# Patient Record
Sex: Female | Born: 1935 | Race: White | Hispanic: No | Smoking: Never smoker
Health system: Southern US, Community
[De-identification: ages and names within clinical notes are randomized; demographics above are authoritative.]

## PROBLEM LIST (undated history)

## (undated) DIAGNOSIS — C801 Malignant (primary) neoplasm, unspecified: Secondary | ICD-10-CM

## (undated) DIAGNOSIS — R569 Unspecified convulsions: Secondary | ICD-10-CM

## (undated) HISTORY — DX: Unspecified convulsions: R56.9

## (undated) HISTORY — PX: OTHER SURGICAL HISTORY: SHX169

---

## 1997-12-05 ENCOUNTER — Ambulatory Visit (HOSPITAL_COMMUNITY): Admission: RE | Admit: 1997-12-05 | Discharge: 1997-12-05 | Payer: Self-pay | Admitting: Family Medicine

## 1997-12-12 ENCOUNTER — Other Ambulatory Visit: Admission: RE | Admit: 1997-12-12 | Discharge: 1997-12-12 | Payer: Self-pay | Admitting: Family Medicine

## 1999-01-02 ENCOUNTER — Ambulatory Visit (HOSPITAL_COMMUNITY): Admission: RE | Admit: 1999-01-02 | Discharge: 1999-01-02 | Payer: Self-pay | Admitting: Geriatric Medicine

## 1999-01-02 ENCOUNTER — Encounter: Payer: Self-pay | Admitting: Geriatric Medicine

## 1999-10-13 ENCOUNTER — Emergency Department (HOSPITAL_COMMUNITY): Admission: EM | Admit: 1999-10-13 | Discharge: 1999-10-13 | Payer: Self-pay | Admitting: Emergency Medicine

## 2001-04-20 ENCOUNTER — Ambulatory Visit (HOSPITAL_COMMUNITY): Admission: RE | Admit: 2001-04-20 | Discharge: 2001-04-20 | Payer: Self-pay | Admitting: Geriatric Medicine

## 2001-04-20 ENCOUNTER — Encounter: Payer: Self-pay | Admitting: Geriatric Medicine

## 2001-11-24 ENCOUNTER — Encounter: Admission: RE | Admit: 2001-11-24 | Discharge: 2001-11-24 | Payer: Self-pay | Admitting: Geriatric Medicine

## 2001-11-24 ENCOUNTER — Encounter: Payer: Self-pay | Admitting: Geriatric Medicine

## 2002-03-15 ENCOUNTER — Ambulatory Visit (HOSPITAL_COMMUNITY): Admission: RE | Admit: 2002-03-15 | Discharge: 2002-03-15 | Payer: Self-pay | Admitting: Gastroenterology

## 2002-07-26 ENCOUNTER — Other Ambulatory Visit: Admission: RE | Admit: 2002-07-26 | Discharge: 2002-07-26 | Payer: Self-pay | Admitting: Geriatric Medicine

## 2003-06-29 ENCOUNTER — Emergency Department (HOSPITAL_COMMUNITY): Admission: EM | Admit: 2003-06-29 | Discharge: 2003-06-29 | Payer: Self-pay | Admitting: *Deleted

## 2003-09-07 ENCOUNTER — Ambulatory Visit (HOSPITAL_COMMUNITY): Admission: RE | Admit: 2003-09-07 | Discharge: 2003-09-07 | Payer: Self-pay | Admitting: Geriatric Medicine

## 2004-07-15 ENCOUNTER — Encounter: Admission: RE | Admit: 2004-07-15 | Discharge: 2004-07-15 | Payer: Self-pay | Admitting: Geriatric Medicine

## 2005-02-16 ENCOUNTER — Encounter: Admission: RE | Admit: 2005-02-16 | Discharge: 2005-02-16 | Payer: Self-pay | Admitting: Geriatric Medicine

## 2005-08-19 ENCOUNTER — Other Ambulatory Visit: Admission: RE | Admit: 2005-08-19 | Discharge: 2005-08-19 | Payer: Self-pay | Admitting: Geriatric Medicine

## 2005-09-10 ENCOUNTER — Encounter: Admission: RE | Admit: 2005-09-10 | Discharge: 2005-09-10 | Payer: Self-pay | Admitting: Urology

## 2006-01-09 ENCOUNTER — Ambulatory Visit: Payer: Self-pay | Admitting: Oncology

## 2006-02-19 ENCOUNTER — Ambulatory Visit (HOSPITAL_COMMUNITY): Admission: RE | Admit: 2006-02-19 | Discharge: 2006-02-19 | Payer: Self-pay | Admitting: Urology

## 2007-07-08 ENCOUNTER — Ambulatory Visit (HOSPITAL_COMMUNITY): Admission: RE | Admit: 2007-07-08 | Discharge: 2007-07-08 | Payer: Self-pay | Admitting: Urology

## 2008-08-07 ENCOUNTER — Ambulatory Visit (HOSPITAL_COMMUNITY): Admission: RE | Admit: 2008-08-07 | Discharge: 2008-08-07 | Payer: Self-pay | Admitting: Geriatric Medicine

## 2008-08-24 ENCOUNTER — Other Ambulatory Visit: Admission: RE | Admit: 2008-08-24 | Discharge: 2008-08-24 | Payer: Self-pay | Admitting: Geriatric Medicine

## 2009-08-13 ENCOUNTER — Ambulatory Visit (HOSPITAL_COMMUNITY): Admission: RE | Admit: 2009-08-13 | Discharge: 2009-08-13 | Payer: Self-pay | Admitting: Geriatric Medicine

## 2009-09-19 ENCOUNTER — Other Ambulatory Visit: Admission: RE | Admit: 2009-09-19 | Discharge: 2009-09-19 | Payer: Self-pay | Admitting: Geriatric Medicine

## 2010-08-22 NOTE — Op Note (Signed)
   NAME:  Melanie Walker, Melanie Walker                           ACCOUNT NO.:  [id number]   MEDICAL RECORD NO.:  [id number]                   PATIENT TYPE:  AMB   LOCATION:  ENDO                                 FACILITY:  [facility]   PHYSICIAN:  Danise Edge, M.D.                DATE OF BIRTH:  [birth year]   DATE OF PROCEDURE:  03/15/2002  DATE OF DISCHARGE:                                 OPERATIVE REPORT   PROCEDURE:  Screening colonoscopy.   INDICATIONS:  The patient is a 77 year old female born 03/03/1931.  The patient  is scheduled to undergo her first screening colonoscopy with polypectomy to  prevent colon cancer.  I discussed with her the complications associated  with colonoscopy and polypectomy, including a 15 per thousand risk of  bleeding and one per thousand risk of colon perforation requiring surgical  repair.  The patient has signed the operative permit.   ENDOSCOPIST:  Danise Edge, M.D.   PREMEDICATION:  Versed 4.5 mg, Demerol 50 mg.   ENDOSCOPE:  Olympus pediatric colonoscope.   DESCRIPTION OF PROCEDURE:  After obtaining informed consent, the patient was  placed in the left lateral decubitus position.  I administered intravenous  Demerol and intravenous Versed to achieve conscious sedation for the  procedure.  The patient's blood pressure, oxygen saturation, and cardiac  rhythm were monitored throughout the procedure and documented in the medical  record.   Anal inspection was normal.  Digital rectal exam was normal.  The Olympus  pediatric video colonoscope was introduced into the rectum and advanced to  the cecum.  A normal-appearing ileocecal valve was intubated and the distal  ileum inspected.  Colonic preparation for the exam today was excellent.   Rectum normal.   Sigmoid colon and descending colon:  Left colonic diverticulosis.   Splenic flexure normal.   Transverse colon normal.   Hepatic flexure normal.   Ascending colon normal.   Cecum and ileocecal valve  normal.   Distal ileum normal.    ASSESSMENT:  Normal screening proctocolonoscopy to the cecum.  No endoscopic  evidence for the presence of colorectal neoplasia.  Left colonic  diverticulosis noted.                                               Danise Edge, M.D.    MJ/MEDQ  D:  03/15/2002  T:  03/15/2002  Job:  [id number]   cc:   Hal T. Stoneking, M.D.  [address] [place], [place] [address]  Fax: [phone]

## 2010-09-18 ENCOUNTER — Other Ambulatory Visit (HOSPITAL_COMMUNITY): Payer: Self-pay | Admitting: Geriatric Medicine

## 2010-09-18 DIAGNOSIS — Z1231 Encounter for screening mammogram for malignant neoplasm of breast: Secondary | ICD-10-CM

## 2010-09-30 ENCOUNTER — Ambulatory Visit (HOSPITAL_COMMUNITY): Payer: Self-pay

## 2010-10-02 ENCOUNTER — Ambulatory Visit (HOSPITAL_COMMUNITY): Payer: Self-pay

## 2010-10-13 ENCOUNTER — Ambulatory Visit (HOSPITAL_COMMUNITY)
Admission: RE | Admit: 2010-10-13 | Discharge: 2010-10-13 | Disposition: A | Discharge status: Home / Self Care | Payer: Medicare HMO | Source: Ambulatory Visit | Attending: Geriatric Medicine | Admitting: Geriatric Medicine

## 2010-10-13 DIAGNOSIS — Z1231 Encounter for screening mammogram for malignant neoplasm of breast: Secondary | ICD-10-CM | POA: Insufficient documentation

## 2011-02-19 ENCOUNTER — Encounter: Payer: Self-pay | Admitting: Neurology

## 2011-03-24 ENCOUNTER — Ambulatory Visit: Payer: Medicare HMO | Admitting: Neurology

## 2011-03-27 ENCOUNTER — Ambulatory Visit: Payer: Medicare HMO | Admitting: Neurology

## 2011-04-16 ENCOUNTER — Ambulatory Visit: Payer: Medicare HMO | Admitting: Neurology

## 2011-09-07 ENCOUNTER — Other Ambulatory Visit (HOSPITAL_COMMUNITY): Payer: Self-pay | Admitting: Geriatric Medicine

## 2011-09-07 DIAGNOSIS — Z1231 Encounter for screening mammogram for malignant neoplasm of breast: Secondary | ICD-10-CM

## 2011-10-15 ENCOUNTER — Ambulatory Visit (HOSPITAL_COMMUNITY): Payer: Medicare HMO

## 2011-10-15 ENCOUNTER — Ambulatory Visit (HOSPITAL_COMMUNITY)
Admission: RE | Admit: 2011-10-15 | Discharge: 2011-10-15 | Disposition: A | Discharge status: Home / Self Care | Payer: Medicare HMO | Source: Ambulatory Visit | Attending: Geriatric Medicine | Admitting: Geriatric Medicine

## 2011-10-15 DIAGNOSIS — Z1231 Encounter for screening mammogram for malignant neoplasm of breast: Secondary | ICD-10-CM | POA: Insufficient documentation

## 2012-09-11 ENCOUNTER — Other Ambulatory Visit: Payer: Self-pay

## 2012-09-11 MED ORDER — ZONISAMIDE 100 MG PO CAPS: 200.0000 mg | ORAL_CAPSULE | Freq: Two times a day (BID) | ORAL | Status: DC

## 2012-09-12 ENCOUNTER — Telehealth: Payer: Self-pay | Admitting: Neurology

## 2012-09-12 NOTE — Telephone Encounter (Signed)
Needs Zonisamide prescription sent to [facility] at fax number [phone].  She asks that you call her once this has been taken care of.

## 2012-09-12 NOTE — Telephone Encounter (Signed)
Rx was already sent, see confirmation message below: E-Prescribing Status: Receipt confirmed by pharmacy (09/11/2012 10:29 PM EDT) Since it is mail order, they usually say allow up to 72 hours to enter and process Rx's sent.  I called patient back.  She is aware Rx has been sent.

## 2012-09-14 ENCOUNTER — Other Ambulatory Visit (HOSPITAL_COMMUNITY): Payer: Self-pay | Admitting: Geriatric Medicine

## 2012-09-14 DIAGNOSIS — Z1231 Encounter for screening mammogram for malignant neoplasm of breast: Secondary | ICD-10-CM

## 2012-10-20 ENCOUNTER — Ambulatory Visit (HOSPITAL_COMMUNITY): Admission: RE | Admit: 2012-10-20 | Payer: Medicare HMO | Source: Ambulatory Visit

## 2012-11-03 ENCOUNTER — Ambulatory Visit (HOSPITAL_COMMUNITY)
Admission: RE | Admit: 2012-11-03 | Discharge: 2012-11-03 | Disposition: A | Discharge status: Home / Self Care | Payer: Medicare HMO | Source: Ambulatory Visit | Attending: Geriatric Medicine | Admitting: Geriatric Medicine

## 2012-11-03 DIAGNOSIS — Z1231 Encounter for screening mammogram for malignant neoplasm of breast: Secondary | ICD-10-CM | POA: Insufficient documentation

## 2012-12-20 ENCOUNTER — Other Ambulatory Visit: Payer: Self-pay

## 2012-12-20 MED ORDER — ZONISAMIDE 100 MG PO CAPS: 200.0000 mg | ORAL_CAPSULE | Freq: Two times a day (BID) | ORAL | Status: DC

## 2012-12-22 ENCOUNTER — Telehealth: Payer: Self-pay

## 2012-12-22 NOTE — Telephone Encounter (Signed)
Patient called inquiring if we had sent her [facility]gran [facility] to [facility].  I advised her we had on 12/20/12.  She will follow up with them regarding Rx.

## 2013-03-08 ENCOUNTER — Ambulatory Visit (INDEPENDENT_AMBULATORY_CARE_PROVIDER_SITE_OTHER): Payer: Medicare HMO | Admitting: Neurology

## 2013-03-08 ENCOUNTER — Encounter (INDEPENDENT_AMBULATORY_CARE_PROVIDER_SITE_OTHER): Payer: Self-pay

## 2013-03-08 ENCOUNTER — Encounter: Payer: Self-pay | Admitting: Neurology

## 2013-03-08 VITALS — BP 146/75 | HR 65 | Ht <= 58 in | Wt 106.0 lb

## 2013-03-08 DIAGNOSIS — M545 Low back pain, unspecified: Secondary | ICD-10-CM | POA: Insufficient documentation

## 2013-03-08 DIAGNOSIS — R209 Unspecified disturbances of skin sensation: Secondary | ICD-10-CM

## 2013-03-08 DIAGNOSIS — R200 Anesthesia of skin: Secondary | ICD-10-CM | POA: Insufficient documentation

## 2013-03-08 DIAGNOSIS — G40209 Localization-related (focal) (partial) symptomatic epilepsy and epileptic syndromes with complex partial seizures, not intractable, without status epilepticus: Secondary | ICD-10-CM | POA: Insufficient documentation

## 2013-03-08 MED ORDER — ZONISAMIDE 100 MG PO CAPS: 200.0000 mg | ORAL_CAPSULE | Freq: Two times a day (BID) | ORAL | Status: DC

## 2013-03-08 NOTE — Progress Notes (Signed)
[facility]  PATIENT: Melanie Walker DOB: [birth year]  HISTORICAL Melanie Walker is 79 years old right-handed Caucasian female, came in to followup for her complex partial seizure. last clinical visit was in December 2013.  She had past medical history of complex partial seizure,  previous patient of Dr. Lissa Walker, last occurrence was in late 1990s, over the years, she was treated with Vigabatrin for extended period of time, she developed visual field deficit, she was switched to zonisamide 100 mg 2 tablets twice a day, she tolerated the medication very well, there is no recurrent seizures.  Previously she also tried Dilantin, Neurontin, Tegretol, Depakote, Felbatol in the past, she could not tolerate it, or it did not control her seizures well.  For many years, she complains of bilateral feet cold sensation, numbness tingling, had EMG nerve conduction twice that was all normal. There was no evidence of large fiber peripheral neuropathy  Today she complains of few months history of low back pain and shooting along right posterior leg, worsening bilateral feet paresthesia, up to her knee level, mild unsteady gait, she denies bowel and bladder incontinence, she lives alone, independent of living, still driving,  REVIEW OF SYSTEMS: Full 14 system review of systems performed and notable only for hearing loss, trouble swallowing, feeling cold, constipation, allergies, runny nose,  ALLERGIES: Allergies  Allergen Reactions  . Biaxin [Clarithromycin]   . Ceftin [Cefuroxime Axetil]   . Dilantin [Phenytoin Sodium Extended]   . Imitrex [Sumatriptan]   . Keflex [Cephalexin]   . Penicillins   . Sulfa Antibiotics     HOME MEDICATIONS: Outpatient Prescriptions Prior to Visit  Medication Sig Dispense Refill  . zonisamide (ZONEGRAN) 100 MG capsule Take 2 capsules (200 mg total) by mouth 2 (two) times daily.  360 capsule  0   No facility-administered medications prior to visit.     PAST MEDICAL HISTORY: Past Medical History  Diagnosis Date  . Seizure     PAST SURGICAL HISTORY: Past Surgical History  Procedure Laterality Date  . None      FAMILY HISTORY: Family History  Problem Relation Age of Onset  . Heart Problems Mother   . Heart Problems Father     SOCIAL HISTORY:  History   Social History  . Marital Status: Married    Spouse Name: N/A    Number of Children: 2  . Years of Education: 12th   Occupational History  .      Retired   Social History Main Topics  . Smoking status: Never Smoker   . Smokeless tobacco: Never Used  . Alcohol Use: No  . Drug Use: No  . Sexual Activity: Not on file   Other Topics Concern  . Not on file   Social History Narrative   Patient lives at home alone and she is widowed. (Retired)   Education- 12 th grade   Right handed.   Caffeine- two cups of coffee daily.     PHYSICAL EXAM   Filed Vitals:   03/08/13 1153  BP: 146/75  Pulse: 65  Height: 4\' 10"  (1.473 m)  Weight: 106 lb (48.081 kg)    Body mass index is 22.16 kg/(m^2).   Generalized: In no acute distress  Neck: Supple, no carotid bruits   Cardiac: Regular rate rhythm  Pulmonary: Clear to auscultation bilaterally  Musculoskeletal: No deformity  Neurological examination  Mentation: Alert oriented to time, place, history taking, and causual conversation  Cranial nerve II-XII: Pupils were equal round reactive  to light extraocular movements were full, visual field were full on confrontational test. facial sensation and strength were normal. hearing was intact to finger rubbing bilaterally. Uvula tongue midline.  head turning and shoulder shrug and were normal and symmetric.Tongue protrusion into cheek strength was normal.  Motor: normal tone, bulk and strength.  Sensory: mildly length dependent decreased fine touch, pinprick to above ankle level, preserved vibratory sensation, and proprioception at toes.  Coordination: Normal  finger to nose, heel-to-shin bilaterally there was no truncal ataxia  Gait: Rising up from seated position without assistance, normal stance, without trunk ataxia, moderate stride, good arm swing, smooth turning, able to perform tiptoe, and heel walking without difficulty.   Romberg signs: Negative  Deep tendon reflexes: Brachioradialis 2/2, biceps 2/2, triceps 2/2, patellar 2/2, Achilles trace, plantar responses were flexor bilaterally.   DIAGNOSTIC DATA (LABS, IMAGING, TESTING) - I reviewed patient records, labs, notes, testing and imaging myself where available.  ASSESSMENT AND PLAN  79 years old Caucasian female, with past medical history of complex partial seizure, recent bilateral feet paresthesia, she also complains of low back pain, radiating pain to her right posterior lack, suggestive of right lumbar radiculopathy, differentiation diagnosis also including peripheral neuropathy  1. Refill her zonogram 100 mg twice a day 2. EMG nerve conduction study 3. Laboratory evaluations 4. MRI of lumbar     Melanie Walker, M.D. Ph.D.  [facility] [address], [address] [place], [place] [address] [phone]

## 2013-03-09 ENCOUNTER — Telehealth: Payer: Self-pay | Admitting: Neurology

## 2013-03-09 NOTE — Telephone Encounter (Signed)
Patient would like to know if there is another route that she may take instead of the MRI(don't think she can do that), she has also cancelled the NCV/ EMG. Please call to discuss

## 2013-03-09 NOTE — Telephone Encounter (Signed)
Wants to speak to someone about having the MRI and the NCV/EMG that she is having tomorrow. Please call today

## 2013-03-09 NOTE — Telephone Encounter (Signed)
Please advise 

## 2013-03-09 NOTE — Telephone Encounter (Signed)
I have called her, to confirm that she needs EMG/NCS, I will change to open MRI for her concern of claustrophobia.  Xanax before MRI.

## 2013-03-10 ENCOUNTER — Encounter: Payer: Medicare HMO | Admitting: Neurology

## 2013-04-21 ENCOUNTER — Ambulatory Visit
Admission: RE | Admit: 2013-04-21 | Discharge: 2013-04-21 | Disposition: A | Discharge status: Home / Self Care | Payer: Commercial Managed Care - HMO | Source: Ambulatory Visit | Attending: Internal Medicine | Admitting: Internal Medicine

## 2013-04-21 ENCOUNTER — Other Ambulatory Visit: Payer: Self-pay | Admitting: Internal Medicine

## 2013-04-21 DIAGNOSIS — M25469 Effusion, unspecified knee: Secondary | ICD-10-CM

## 2013-12-12 ENCOUNTER — Telehealth: Payer: Self-pay | Admitting: *Deleted

## 2013-12-12 ENCOUNTER — Other Ambulatory Visit (HOSPITAL_COMMUNITY): Payer: Self-pay | Admitting: Geriatric Medicine

## 2013-12-12 DIAGNOSIS — Z1231 Encounter for screening mammogram for malignant neoplasm of breast: Secondary | ICD-10-CM

## 2013-12-12 MED ORDER — ZONISAMIDE 100 MG PO CAPS: 200.0000 mg | ORAL_CAPSULE | Freq: Two times a day (BID) | ORAL | Status: DC

## 2013-12-12 NOTE — Telephone Encounter (Signed)
Rx has been sent  

## 2013-12-14 ENCOUNTER — Ambulatory Visit (HOSPITAL_COMMUNITY)
Admission: RE | Admit: 2013-12-14 | Discharge: 2013-12-14 | Disposition: A | Discharge status: Home / Self Care | Payer: Medicare HMO | Source: Ambulatory Visit | Attending: Geriatric Medicine | Admitting: Geriatric Medicine

## 2013-12-14 DIAGNOSIS — Z1231 Encounter for screening mammogram for malignant neoplasm of breast: Secondary | ICD-10-CM | POA: Diagnosis present

## 2013-12-19 ENCOUNTER — Other Ambulatory Visit: Payer: Self-pay | Admitting: Geriatric Medicine

## 2013-12-19 ENCOUNTER — Ambulatory Visit
Admission: RE | Admit: 2013-12-19 | Discharge: 2013-12-19 | Disposition: A | Discharge status: Home / Self Care | Payer: Commercial Managed Care - HMO | Source: Ambulatory Visit | Attending: Geriatric Medicine | Admitting: Geriatric Medicine

## 2013-12-19 DIAGNOSIS — J8410 Pulmonary fibrosis, unspecified: Secondary | ICD-10-CM

## 2014-01-17 ENCOUNTER — Other Ambulatory Visit: Payer: Self-pay | Admitting: Geriatric Medicine

## 2014-01-17 ENCOUNTER — Ambulatory Visit
Admission: RE | Admit: 2014-01-17 | Discharge: 2014-01-17 | Disposition: A | Discharge status: Home / Self Care | Payer: Commercial Managed Care - HMO | Source: Ambulatory Visit | Attending: Geriatric Medicine | Admitting: Geriatric Medicine

## 2014-01-17 DIAGNOSIS — J8410 Pulmonary fibrosis, unspecified: Secondary | ICD-10-CM

## 2014-02-16 ENCOUNTER — Telehealth: Payer: Self-pay | Admitting: Neurology

## 2014-02-16 MED ORDER — ZONISAMIDE 100 MG PO CAPS: 200.0000 mg | ORAL_CAPSULE | Freq: Two times a day (BID) | ORAL | Status: DC

## 2014-02-16 NOTE — Telephone Encounter (Signed)
A 6 month Rx was sent to [facility] in Sept per patient request.  I called back to inquire if she wants Rx sent to a different pharmacy.  Said she still uses [facility].  Advised we will resend Rx.  Patient says she will be seen in Dec, will call back for appt.

## 2014-02-16 NOTE — Telephone Encounter (Signed)
Patient requesting refill of Zonisamide, please return call and advise.

## 2014-03-26 ENCOUNTER — Encounter: Payer: Self-pay | Admitting: Neurology

## 2014-03-26 ENCOUNTER — Ambulatory Visit (INDEPENDENT_AMBULATORY_CARE_PROVIDER_SITE_OTHER): Payer: Commercial Managed Care - HMO | Admitting: Neurology

## 2014-03-26 VITALS — BP 162/85 | HR 69 | Ht 59.0 in | Wt 96.5 lb

## 2014-03-26 DIAGNOSIS — R569 Unspecified convulsions: Secondary | ICD-10-CM

## 2014-03-26 MED ORDER — ZONISAMIDE 100 MG PO CAPS: 200.0000 mg | ORAL_CAPSULE | Freq: Two times a day (BID) | ORAL | Status: DC

## 2014-03-26 NOTE — Progress Notes (Signed)
[facility]  PATIENT: Melanie Walker DOB: 04-30-1930  HISTORICAL Melanie Walker is 80 years old right-handed Caucasian female, came in to followup for her complex partial seizure. last clinical visit was in December 2013.  She had past medical history of complex partial seizure,  previous patient of Dr. Ron Parker, last occurrence was in late 1990s, over the years, she was treated with Vigabatrin for extended period of time, she developed visual field deficit, she was switched to zonisamide 100 mg 2 tablets twice a day, she tolerated the medication very well, there is no recurrent seizures.  Previously she also tried Dilantin, Neurontin, Tegretol, Depakote, Felbatol in the past, she could not tolerate it, or it did not control her seizures well.  For many years, she complains of bilateral feet cold sensation, numbness tingling, had EMG nerve conduction twice that was all normal. There was no evidence of large fiber peripheral neuropathy  Today she complains of few months history of low back pain and shooting along right posterior leg, worsening bilateral feet paresthesia, up to her knee level, mild unsteady gait, she denies bowel and bladder incontinence, she lives alone, independent of living, still driving,  UPDATE Mar 26 2014: She slipped and fell, had left knee fracture in Jan 4th 2015, recovered well after brace, physical therapy. She fell again in April 2015, right ankle drosiflexion, with right foot fracture now improved.   She has no recurrent seizure, tolerating Zonegran 100 mg twice a day   REVIEW OF SYSTEMS: Full 14 system review of systems performed and notable only for hearing loss, trouble swallowing, feeling cold, constipation, allergies, runny nose,  ALLERGIES: Allergies  Allergen Reactions  . Biaxin [Clarithromycin]   . Ceftin [Cefuroxime Axetil]   . Dilantin [Phenytoin Sodium Extended]   . Imitrex [Sumatriptan]   . Keflex [Cephalexin]   . Penicillins     . Sulfa Antibiotics     HOME MEDICATIONS: Outpatient Prescriptions Prior to Visit  Medication Sig Dispense Refill  . Calcium Carb-Cholecalciferol (OYSTER SHELL CALCIUM/VITAMIN D) 250-125 MG-UNIT TABS     . Salicylic Acid 40 % PADS     . zonisamide (ZONEGRAN) 100 MG capsule Take 2 capsules (200 mg total) by mouth 2 (two) times daily. 360 capsule 0   No facility-administered medications prior to visit.    PAST MEDICAL HISTORY: Past Medical History  Diagnosis Date  . Seizure     PAST SURGICAL HISTORY: Past Surgical History  Procedure Laterality Date  . None      FAMILY HISTORY: Family History  Problem Relation Age of Onset  . Heart Problems Mother   . Heart Problems Father     SOCIAL HISTORY:  History   Social History  . Marital Status: Widowed    Spouse Name: N/A    Number of Children: 2  . Years of Education: 12th   Occupational History  .      Retired   Social History Main Topics  . Smoking status: Never Smoker   . Smokeless tobacco: Never Used  . Alcohol Use: No  . Drug Use: No  . Sexual Activity: Not on file   Other Topics Concern  . Not on file   Social History Narrative   Patient lives at home alone and she is widowed. (Retired)   Education- 12 th grade   Right handed.   Caffeine- two cups of coffee daily.     PHYSICAL EXAM   Filed Vitals:   03/26/14 1131  BP: 162/85  Pulse: 69  Height: 4\' 11"  (1.499 m)  Weight: 96 lb 8 oz (43.772 kg)    Body mass index is 19.48 kg/(m^2).   Generalized: In no acute distress  Neck: Supple, no carotid bruits   Cardiac: Regular rate rhythm  Pulmonary: Clear to auscultation bilaterally  Musculoskeletal: No deformity  Neurological examination  Mentation: Alert oriented to time, place, history taking, and causual conversation  Cranial nerve II-XII: Pupils were equal round reactive to light extraocular movements were full, visual field were full on confrontational test. facial sensation and  strength were normal. hearing was intact to finger rubbing bilaterally. Uvula tongue midline.  head turning and shoulder shrug and were normal and symmetric.Tongue protrusion into cheek strength was normal.  Motor: normal tone, bulk and strength.  Sensory: mildly length dependent decreased fine touch, pinprick to above ankle level, preserved vibratory sensation, and proprioception at toes.  Coordination: Normal finger to nose, heel-to-shin bilaterally there was no truncal ataxia  Gait: Rising up from seated position By pushing on chair arm, cautious  Romberg signs: Negative  Deep tendon reflexes: Brachioradialis 2/2, biceps 2/2, triceps 2/2, patellar 2/2, Achilles trace, plantar responses were flexor bilaterally.   DIAGNOSTIC DATA (LABS, IMAGING, TESTING) - I reviewed patient records, labs, notes, testing and imaging myself where available.  ASSESSMENT AND PLAN 80 years old Caucasian female, with past medical history of complex partial seizure,  Refill her Zonegran 100 mg twice a day  Return to clinic in one year   Marcial Pacas, M.D. Ph.D.  [facility] [address], [place], [place] [address] [phone]

## 2014-08-20 DIAGNOSIS — N950 Postmenopausal bleeding: Secondary | ICD-10-CM | POA: Diagnosis not present

## 2014-08-20 DIAGNOSIS — N760 Acute vaginitis: Secondary | ICD-10-CM | POA: Diagnosis not present

## 2014-09-13 DIAGNOSIS — N390 Urinary tract infection, site not specified: Secondary | ICD-10-CM | POA: Diagnosis not present

## 2014-09-13 DIAGNOSIS — R5383 Other fatigue: Secondary | ICD-10-CM | POA: Diagnosis not present

## 2014-09-13 DIAGNOSIS — R2681 Unsteadiness on feet: Secondary | ICD-10-CM | POA: Diagnosis not present

## 2014-09-13 DIAGNOSIS — I10 Essential (primary) hypertension: Secondary | ICD-10-CM | POA: Diagnosis not present

## 2014-09-14 DIAGNOSIS — J309 Allergic rhinitis, unspecified: Secondary | ICD-10-CM | POA: Diagnosis not present

## 2014-09-14 DIAGNOSIS — I10 Essential (primary) hypertension: Secondary | ICD-10-CM | POA: Diagnosis not present

## 2014-09-14 DIAGNOSIS — R102 Pelvic and perineal pain: Secondary | ICD-10-CM | POA: Diagnosis not present

## 2014-10-11 DIAGNOSIS — H40013 Open angle with borderline findings, low risk, bilateral: Secondary | ICD-10-CM | POA: Diagnosis not present

## 2014-10-11 DIAGNOSIS — H4720 Unspecified optic atrophy: Secondary | ICD-10-CM | POA: Diagnosis not present

## 2014-10-11 DIAGNOSIS — Z961 Presence of intraocular lens: Secondary | ICD-10-CM | POA: Diagnosis not present

## 2014-10-11 DIAGNOSIS — H04123 Dry eye syndrome of bilateral lacrimal glands: Secondary | ICD-10-CM | POA: Diagnosis not present

## 2014-10-17 DIAGNOSIS — N952 Postmenopausal atrophic vaginitis: Secondary | ICD-10-CM | POA: Diagnosis not present

## 2014-11-07 DIAGNOSIS — H1851 Endothelial corneal dystrophy: Secondary | ICD-10-CM | POA: Diagnosis not present

## 2014-11-07 DIAGNOSIS — H1189 Other specified disorders of conjunctiva: Secondary | ICD-10-CM | POA: Diagnosis not present

## 2014-11-07 DIAGNOSIS — Z961 Presence of intraocular lens: Secondary | ICD-10-CM | POA: Diagnosis not present

## 2014-11-07 DIAGNOSIS — H04123 Dry eye syndrome of bilateral lacrimal glands: Secondary | ICD-10-CM | POA: Diagnosis not present

## 2014-11-14 DIAGNOSIS — H10413 Chronic giant papillary conjunctivitis, bilateral: Secondary | ICD-10-CM | POA: Diagnosis not present

## 2014-11-14 DIAGNOSIS — H1851 Endothelial corneal dystrophy: Secondary | ICD-10-CM | POA: Diagnosis not present

## 2014-11-14 DIAGNOSIS — Z961 Presence of intraocular lens: Secondary | ICD-10-CM | POA: Diagnosis not present

## 2014-11-14 DIAGNOSIS — H04123 Dry eye syndrome of bilateral lacrimal glands: Secondary | ICD-10-CM | POA: Diagnosis not present

## 2014-11-29 ENCOUNTER — Other Ambulatory Visit: Payer: Self-pay | Admitting: Obstetrics & Gynecology

## 2014-11-29 DIAGNOSIS — N9089 Other specified noninflammatory disorders of vulva and perineum: Secondary | ICD-10-CM | POA: Diagnosis not present

## 2014-11-29 DIAGNOSIS — N952 Postmenopausal atrophic vaginitis: Secondary | ICD-10-CM | POA: Diagnosis not present

## 2014-11-29 DIAGNOSIS — N898 Other specified noninflammatory disorders of vagina: Secondary | ICD-10-CM | POA: Diagnosis not present

## 2014-12-13 ENCOUNTER — Other Ambulatory Visit (HOSPITAL_COMMUNITY): Payer: Self-pay | Admitting: Geriatric Medicine

## 2014-12-13 DIAGNOSIS — Z1231 Encounter for screening mammogram for malignant neoplasm of breast: Secondary | ICD-10-CM

## 2014-12-24 ENCOUNTER — Other Ambulatory Visit: Payer: Self-pay | Admitting: Geriatric Medicine

## 2014-12-24 DIAGNOSIS — Z1231 Encounter for screening mammogram for malignant neoplasm of breast: Secondary | ICD-10-CM

## 2014-12-25 ENCOUNTER — Ambulatory Visit
Admission: RE | Admit: 2014-12-25 | Discharge: 2014-12-25 | Disposition: A | Discharge status: Home / Self Care | Payer: Commercial Managed Care - HMO | Source: Ambulatory Visit | Attending: Geriatric Medicine | Admitting: Geriatric Medicine

## 2014-12-25 ENCOUNTER — Ambulatory Visit (HOSPITAL_COMMUNITY): Payer: Commercial Managed Care - HMO

## 2014-12-25 DIAGNOSIS — Z1231 Encounter for screening mammogram for malignant neoplasm of breast: Secondary | ICD-10-CM | POA: Diagnosis not present

## 2014-12-25 DIAGNOSIS — I10 Essential (primary) hypertension: Secondary | ICD-10-CM | POA: Diagnosis not present

## 2014-12-25 DIAGNOSIS — E559 Vitamin D deficiency, unspecified: Secondary | ICD-10-CM | POA: Diagnosis not present

## 2014-12-25 DIAGNOSIS — Z0000 Encounter for general adult medical examination without abnormal findings: Secondary | ICD-10-CM | POA: Diagnosis not present

## 2014-12-25 DIAGNOSIS — E780 Pure hypercholesterolemia: Secondary | ICD-10-CM | POA: Diagnosis not present

## 2014-12-25 DIAGNOSIS — Z79899 Other long term (current) drug therapy: Secondary | ICD-10-CM | POA: Diagnosis not present

## 2014-12-25 DIAGNOSIS — G629 Polyneuropathy, unspecified: Secondary | ICD-10-CM | POA: Diagnosis not present

## 2014-12-25 DIAGNOSIS — G40909 Epilepsy, unspecified, not intractable, without status epilepticus: Secondary | ICD-10-CM | POA: Diagnosis not present

## 2014-12-25 DIAGNOSIS — Z1389 Encounter for screening for other disorder: Secondary | ICD-10-CM | POA: Diagnosis not present

## 2015-01-09 DIAGNOSIS — I10 Essential (primary) hypertension: Secondary | ICD-10-CM | POA: Diagnosis not present

## 2015-01-09 DIAGNOSIS — M810 Age-related osteoporosis without current pathological fracture: Secondary | ICD-10-CM | POA: Diagnosis not present

## 2015-01-09 DIAGNOSIS — N952 Postmenopausal atrophic vaginitis: Secondary | ICD-10-CM | POA: Diagnosis not present

## 2015-01-09 DIAGNOSIS — L900 Lichen sclerosus et atrophicus: Secondary | ICD-10-CM | POA: Diagnosis not present

## 2015-01-18 NOTE — Patient Outreach (Signed)
[facility]) Care Management  01/18/2015  Melanie Walker [birth year] [id number]   Referral from [place], assigned to [name], RN for patient outreach.  [name] Both L. Keionna Kinnaird, [place] Care Management Assistant

## 2015-01-24 ENCOUNTER — Other Ambulatory Visit: Payer: Self-pay

## 2015-01-24 NOTE — Patient Outreach (Signed)
[facility]) Care Management  01/24/2015  [name] March 12, 1931 [id number]  Telephonic Care Management Note   Referral Date:  01/18/2015 Referral Source:  [place] Referral Issue:  [name] would like a call and ask to speak to her daughter Rubylee Zamarripa.  The family is requesting help with some issues that will be best to be handled by [facility].   Insurance:  [employer] Medicare HMO [facility] Conditions:  None  Per Epic MR Review:  Other:  HTN, complex partial seizure.  Outreach call # 1 to daughter's home #; not reached.  RN CM left HIPAA compliant voice message with name and number and requested call back.  Plan: RN CM will reschedule for next outreach call within one week.   [name], RN, BSN, [facility], CCM  [employer] Management Coordinator [phone] Direct [phone] Cell [phone] Office [phone] Fax

## 2015-01-25 ENCOUNTER — Other Ambulatory Visit: Payer: Self-pay

## 2015-01-25 DIAGNOSIS — Z9181 History of falling: Secondary | ICD-10-CM

## 2015-01-25 DIAGNOSIS — I10 Essential (primary) hypertension: Secondary | ICD-10-CM

## 2015-01-25 NOTE — Patient Outreach (Signed)
[facility]) Care Management  01/25/2015  Melanie Walker [birth year] [id number]   Telephonic Care Coordination Note  Outreach call to patient's daughter, Melanie Walker at cell # [phone].  Daughter states she prefers to be contacted at her cell #.    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  States patient would benefit from [facility] services.  Daughter confirmed patient does not have Medicaid.  States patient has never applied and daughter is sure she would not be eligible.   RN CM provided education on PCS services provided under Medicaid or private pay.  [employer] does not cover PCS services.    RN CM confirmed patient does not have LTC insurance coverage.   Fall Risk  Patient states she needs more rehab to strengthen her legs and ankles. States she received no rehab services following her ankle fracture. RN CM provided update to daughter that RN CM notified Dr. Felipa Eth 01/25/2015 of patient's request.  RN CM advised Dr. Felipa Eth may need to see patient prior to ordering [facility] PT services. RN CM updated that [facility] provides only private pay services or LTC covered by insurance.  RN CM advised that patient/daughter will need to select a [facility] provider in-network with [employer].     Daughter requested to hold off on PT services because her son is coming home and they have not seen him in a year.  Daughter states they need to focus on family time and will discuss needs with Dr. Felipa Eth on next office appt 02/05/2015.  Daughter states patient will be fine with this and is not in any hurry.   HTN RN CM provided update regarding the following issue:  Patient reported recently had new issues with high BP reading and MD initiated BP medication. Patient has 2 more pills left. Patient thinks she is NOT supposed to refill the prescription and the MD only wanted her  to take 1 prescription. Next follow-up appt is 02/05/2015. Patient does not have a home BP cuff. Patient is interested in purchasing a cuff and is not sure if [employer] has a benefit for coverage of this item. Daughter states she is a CNA and checks BP but her cuff is broken.  Daughter would like to know if [facility] will cover a new BP cuff.  RN CM advised that patient stated she has an order for a BP cuff.  Advised to take to pharmacy and have pharmacist check to see if shows as covered item under insurance.  Advised that BP cuffs can be purchased at all pharmacies, [place], [employer], [employer] or medical supply companies.  Encouraged to purchase a digital cuff to help patient be able to learn to self manage more easily.   Plan:  RN CM will continue to assess patient for care coordination needs for home PT services and discuss daughters delay request with patient on next contact call to confirm patients wishes.  RN CM will update patient on BP medication once update received back from Dr. Carlyle Lipa office.  Melanie Laster, RN, BSN, [facility], CCM  [employer] Management Coordinator [phone] Direct [phone] Cell [phone] Office [phone] Fax

## 2015-01-25 NOTE — Patient Outreach (Signed)
[facility]) Care Management  01/25/2015  Melanie Walker [facility] [birth year] [id number]   Telephonic Care Coordination Note  Outreach call to patient.  Patient reached.  RN CM provided patient with update regarding BP medication instructions.   RN CM advised to refill medication per Dr. Carlyle Lipa order, remain on medication.  MD will assess BP reading on next appt scheduled for 02/05/15.    RN CM discussed wishes for started or delaying Home PT services.  Patient confirms she desires to delay services due to family focus right now.  Advised that should Dr. Felipa Eth place [facility] order and agency contacts patient to please update agency of her wishes and discuss a start date.  Patient requested a "[facility]" PT only.      Plan:   RN CM will follow-up with patient within 30 days for initial assessment.  RN CM will placed additional call to Dr. Sherryll Burger office to provide the above update regarding patients wishes to delay PT and "[place]" PT only.   [name], RN, BSN, [facility], CCM  [employer] Management Coordinator [phone] Direct [phone] Cell [phone] Office [phone] Fax

## 2015-01-25 NOTE — Patient Outreach (Signed)
[facility]) Care Management  01/25/2015  AUNE ADAMI 29-Apr-1930 [id number]   Telephonic Care Coordination Note  Outreach call to provider for [facility] in-network status.  [facility] [phone] [address] [place], [place] [address] Contact:  [place] verified [facility] does NOT accept MEDICARE; only Private Pay.  Provides SN, CNA services only.  Provider does work with [employer] patients.   Plan:  RN CM will follow-up with patient to confirm if patient has LTC insurance or if her preference for service needs with [facility] will be based on private pay.    [name], RN, BSN, [facility], CCM  [employer] Management Coordinator [phone] Direct [phone] Cell [phone] Office [phone] Fax

## 2015-01-25 NOTE — Patient Outreach (Signed)
[facility]) Care Management  01/25/2015  Melanie Walker 02/15/1931 [id number]   Telephonic Care Management Note   Referral Date: 01/18/2015 Referral Source: [place] Referral Issue: Melanie Walker would like a call and ask to speak to her daughter Melanie Walker. The family is requesting help with some issues that will be best to be handled by [facility] [place].   Outreach call # 2 to patient; reached and screening completed. [id number] Conditions: None  Per Epic MR Review: Other: HTN, complex partial seizure.   PCP:  Dr. Felipa Walker - Last appt  10/17/14 - next appt 02/05/2015.  Neurologist:  Melanie Pacas, MD  Social: Patient lives alone in her home alone.  Daughter/Melanie Walker lives about 45 minutes away.  Mobility issues:  H/o past Fractured Knee and 2nd fall following that event;  fractured right ankle and fell on that same right knee previously fractured.   Ambulates with a cane.  Has a platform walker but does not like to use because she has to pick it up.    Caregiver:  Daughter/Melanie Walker who works as a Corporate investment banker [place].  Transportation:  Patient drives.  DME:  Cane, walker (platform).  Patient states she needs more rehab to strengthen her legs and ankles.  States she received no rehab services following her ankle fracture.   HTN States recently had new issues with high BP reading and MD initiated BP medication.  States she has 2 more pills left.  Patient states she does not think she is supposed to refill the prescription and the MD only wanted her to take 1 prescription.  Next follow-up appt is 02/05/2015.  Patient does not have a home BP cuff.  Patient is interested in purchasing a cuff and is not sure if Melanie Walker has a benefit for coverage of this item.    Medications: Less than 10.  States all meds are affordable.  Takes as directed and no issues with side effects with current medication regime.  Flu:  No "makes me  sick and   Consent: Patient agreed to [facility] services.   Plan: RN CM will follow-up on [employer] coverage for BP monitor RN CM will follow-up with Dr. Felipa Walker for clarification on BP medication instructions and if refill is needed.  Is patient supposed to stay on BP medication until next appt on 02/05/15?  RN CM will follow-up with Dr. Felipa Walker for Home PT order. RN CM will contact [facility] [place] to verify if in-network with [facility] for services.     RN CM notified [place] Assistant: agreed to services/case opened. RN CM sent successful outreach letter, [facility] Introductory package and consent for service form.  RN CM will schedule for next contact call within 30 days for Initial Assessment and follow-up with care coordination needs as indicated. RN CM advised to please notify MD of any changes in condition prior to scheduled appt's.   RN CM provided contact name and # [phone] or main office # [phone] and 24-hour nurse line # [phone].  RN CM confirmed patient is aware of 911 services for urgent emergency needs.  Melanie Laster, RN, BSN, [facility], CCM  [employer] Management Coordinator [phone] Direct [phone] Cell [phone] Office [phone] Fax

## 2015-01-25 NOTE — Patient Outreach (Signed)
[facility]) Care Management  01/25/2015  Melanie Walker [birth year] [id number]   Inbound call received from Dr. Felipa Eth office contact: Melanie Walker; confirms Yes patient to remain on BP medication.  MD provided patient with 9 refills.  Dr. Felipa Eth will assess BP on next scheduled office appt 02/05/2015  RN CM will notify patient and Daughter of update.   [name], RN, BSN, [facility], CCM  [employer] Management Coordinator [phone] Direct [phone] Cell [phone] Office [phone] Fax

## 2015-01-25 NOTE — Patient Outreach (Signed)
[facility]) Care Management  01/25/2015  Nalda Shackleford [facility] 1930/08/13 [id number]   Telephonic Care Coordination  Outreach call to Dr. Lajean Manes.   Issue:  Is patient supposed to stay on BP medication until next appt on 02/05/15?  HTN:  States recently had new issues with high BP reading and MD initiated BP medication. States she has 2 more pills left. Patient states she does not think she is supposed to refill the prescription and the MD only wanted her to take 1 prescription. Next follow-up appt is 02/05/2015. Patient does not have a home BP cuff. Patient is interested in purchasing a cuff and is not sure if Mcarthur Rossetti has a benefit for coverage of this item.  RN CM left Voice Message requesting clarification on BP medication, confirmation that patient has refill order if patient is to continue on medication until next office appt on 02/05/2015 if needed. RN CM left name and contact number requesting call back today with update.    Issue:  Patient requesting [place] services:  PT RN CM notified of patient's request.  RN CM will continue follow-up with patient to provide patient with updates.   [name], RN, BSN, [facility], CCM  [employer] Management Coordinator [phone] Direct [phone] Cell [phone] Office [phone] Fax

## 2015-01-30 ENCOUNTER — Other Ambulatory Visit: Payer: Self-pay

## 2015-01-30 DIAGNOSIS — Z9181 History of falling: Secondary | ICD-10-CM

## 2015-01-30 NOTE — Patient Outreach (Signed)
[facility]) Care Management  01/30/2015  Melanie Walker 04-03-1931 [id number]  Telephonic Care Coordination  Outreach call to Primary MD, Dr. Felipa Eth.  RN CM left voice message for Nurse [place].  RN CM requested either electronic or written prescription for BP cuff be sent to [facility] on [address].   RN CM advised of [employer] provider contact for Benefits check - 20% co-pay.  RN CM left name and contact # for call back.    [name], RN, BSN, [facility], CCM  [employer] Management Coordinator [phone] Direct [phone] Cell [phone] Office [phone] Fax

## 2015-01-30 NOTE — Patient Outreach (Signed)
[facility]) Care Management  01/30/2015  Melanie Walker July 09, 1930 [id number]   Outreach call #1 to patient to follow-up on inbound call from patient requesting call back from RN CM regarding a question.  Patient reached.  States unable to find her prescription for BP cuff and unable to take to pharmacy for benefit check.  RN CM completed conference call to [place] # [phone] for BP cuff benefit.  RN CM provided verification to contact [place] Patient ID [id number] DOB Jul 14, 1930 [place] [address] Contact verified benefit as DME coverage benefit with 20% co-pay.    Plan:   RN CM will contact Dr. Felipa Eth to request BP cuff order (per patient request: send order to [facility] on [address].  [name], RN, BSN, [facility], CCM  [employer] Management Coordinator [phone] Direct [phone] Cell [phone] Office [phone] Fax

## 2015-02-01 ENCOUNTER — Other Ambulatory Visit: Payer: Self-pay

## 2015-02-01 NOTE — Patient Outreach (Signed)
[facility]) Care Management  02/01/2015  Melanie Walker 01-Mar-1931 [id number]  Telephonic Care Coordination:   RN CM contacted [facility]. Contact:  [place] states Not able to file due to not set up for DME electronic fillings.    [name], RN, BSN, [facility], CCM  [employer] Management Coordinator [phone] Direct [phone] Cell [phone] Office [phone] Fax

## 2015-02-01 NOTE — Patient Outreach (Signed)
[facility]) Care Management  02/01/2015  ANNALISE MCDIARMID 1930-06-02 [id number]   Inbound voice message received from patient stating Dr. Felipa Eth sent BP cuff order to [facility].  Patient states she called [facility] to confirm order received but [facility] advised they do not file insurance for BP cuffs and suggested patient go to [facility].    Plan:   RN CM will research options and follow-up with member.  [name], RN, BSN, [facility], CCM  [employer] Management Coordinator [phone] Direct [phone] Cell [phone] Office [phone] Fax

## 2015-02-01 NOTE — Patient Outreach (Signed)
[facility]) Care Management  02/01/2015  Melanie Walker 03/03/1931 [id number]   Care Coordination Note  RN CM contacted [facility]), DME provider retail store.  Contact states [facility]) does not file for BP cuffs.  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    Plan: RN CM will provide update to patient.  RN CM will advise patient will need to independently file insurance claim.   [name], RN, BSN, [facility], CCM  [employer] Management Coordinator [phone] Direct [phone] Cell [phone] Office [phone] Fax

## 2015-02-01 NOTE — Patient Outreach (Signed)
[facility]) Care Management  02/01/2015  Melanie Walker [facility] [birth year] [id number]   Care Coordination Note  Outreach call to patient to provide update on BP cuff purchase / coverage coordination of services. Patient reached.   RN CM advised in the following:  [employer] or [employer] will not [employer]; states no option for DME claims; only set up for electronic pharmacy filling system.  [place] or [place] will not file claims due to viewed as retail private pay item.  RN CM advised patient will need to file insurance independently for coverage.  RN CM advised patient to request copy of her prescription so she may file this with her insurance provider to show proof that patient had MD order for item.  RN CM advised that cost of BP cuff would most likely be less at [facility] than a Psychiatric nurse.  Patient verbalized understanding of the above information and instructions.   Plan:   RN CM will follow up with patient on next scheduled call within 1-2 weeks.   [name], RN, BSN, [facility], CCM  [employer] Management Coordinator [phone] Direct [phone] Cell [phone] Office [phone] Fax

## 2015-02-01 NOTE — Patient Outreach (Signed)
[facility]) Care Management  02/01/2015  Melanie Walker 08/21/1930 [id number]   RN CM contacted [place] to verify if provider will file insurance for BP cuff with [employer].   Contact: Jan states No and suggested contacting [place].   [name], RN, BSN, [facility], CCM  [employer] Management Coordinator [phone] Direct [phone] Cell [phone] Office [phone] Fax

## 2015-02-05 DIAGNOSIS — I10 Essential (primary) hypertension: Secondary | ICD-10-CM | POA: Diagnosis not present

## 2015-02-05 DIAGNOSIS — M810 Age-related osteoporosis without current pathological fracture: Secondary | ICD-10-CM | POA: Diagnosis not present

## 2015-02-08 ENCOUNTER — Other Ambulatory Visit: Payer: Self-pay

## 2015-02-08 VITALS — BP 126/68 | Ht 59.0 in | Wt 98.0 lb

## 2015-02-08 DIAGNOSIS — Z9181 History of falling: Secondary | ICD-10-CM

## 2015-02-08 NOTE — Patient Outreach (Signed)
[place] [facility]) Care Management  02/08/2015  Melanie Walker December 18, 1930 [id number]  Referral Date: 01/18/2015 Referral Source: [place] Referral Issue: Melanie Walker would like a call and ask to speak to her daughter Melanie Walker. The family is requesting help with some issues that will be best to be handled by Nurse [facility] [place].  Insurance: [employer] Medicare HMO  PMH: Per Epic MR Review: Other: HTN, complex partial seizure.  PCP:  Dr. Felipa Eth - last appt 02/05/15 Neurologist:  Next appt 03/2015 HHS:  Elects services with [facility]  Social: Patient lives in her home alone.  Mobility: H/o falls.  Patient states she needs more rehab to strengthen her legs and ankles. States she received no rehab services following her ankle fracture.Patient elects to delay starting PT services until after 02/17/2015 due to needing time to spend with grandson who is home from the [employer].   Patient thinks grandson will [place] to base around 02/17/15.   H/o RN CM notified Dr. Felipa Eth 01/25/2015 of patient's request.  Last MD visit with Dr. Felipa Eth 02/05/2015.  H/o Advised that should Dr. Felipa Eth place [facility] order and agency contacts patient to please update agency of her wishes and discuss a start date. Patient requested a "[place]" PT only. Caregiver:  Daughter, Melanie Walker cell # [phone].  HTN Weight 98 lbs BP on last MD appt 126/68. C/O Neuropathy in feet; states managed by Neurology appt once a year.  Next appt due 03/2015.  Patient states Dr. Felipa Eth advised patient home BP cuff is not needed.  Medications Less than 10 Flu Vaccine:  Refused; states "makes me sick".  Per Epic received 11/05/2014 Pneumonia Vaccine: patient unsure   Plan / Intervention:  Initial Assessment completed 02/08/2015 Level 3 Program: Other HTN RN CM will follow progress and coordinate Home PT services as needed. RN CM provided education on importance of Flu and Pneumonia vaccine.    Emmi Education mailed:   -Pneumonia - Flu Vaccines RN CM will send Primary MD Physician Involvement / Barriers letter and Initial Assessment RN CM will follow-up with patient within one month and care coordination services as needed.  RN CM advised to please notify MD of any changes in condition prior to scheduled appt's.   RN CM provided contact name and # [phone] or main office # [phone] and 24-hour nurse line # [phone].  RN CM confirmed patient is aware of 911 services for urgent emergency needs.  [name], RN, BSN, [facility], CCM  [employer] Management Coordinator [phone] Direct [phone] Cell [phone] Office [phone] Fax

## 2015-03-07 ENCOUNTER — Telehealth: Payer: Self-pay | Admitting: Neurology

## 2015-03-07 NOTE — Telephone Encounter (Signed)
I called the patient. She only wants to see Dr. Krista Blue. Appointment r/s with Dr. Krista Blue for 12/6 at 9:30 AM.

## 2015-03-07 NOTE — Telephone Encounter (Signed)
Patient wants to know why she is scheduled with Nurse Practitioner 03/12/15? States she does not want to see a NP, she wants to see a Tax adviser.

## 2015-03-08 ENCOUNTER — Ambulatory Visit: Payer: Self-pay

## 2015-03-08 ENCOUNTER — Other Ambulatory Visit: Payer: Self-pay

## 2015-03-08 DIAGNOSIS — Z9181 History of falling: Secondary | ICD-10-CM

## 2015-03-08 NOTE — Patient Outreach (Signed)
[facility]) Care Management  03/08/2015  Melanie Walker 03/10/1931 [id number]   Telephone Monthly Assessment   Referral Date: 01/18/2015 Referral Source: [place] Referral Issue: Melanie Walker would like a call and ask to speak to her daughter Melanie Walker. The family is requesting help with some issues that will be best to be handled by Nurse Care [place].   Providers: PCP: Dr. Felipa Eth - last appt 02/08/15 Neurologist: Dr. Krista Blue - next appt 03/12/15   HHS: Elects services with [place]: [facility] HMO  Social: Patient lives in her home alone.  Mobility: H/o falls.  Falls:  2-3 with injuries Patient states she needs more rehab to strengthen her legs and ankles. States h/o ankle fracture.  States she last received some rehab services with [place] but sustained another fall since that time and has never returned back to where she was. Patient elected to delay starting PT services until after 02/17/2015 due to needing time to spend with grandson who is home from the [employer]. H/o RN CM notified Dr. Felipa Eth 01/25/2015 of patient's request. Last MD visit with Dr. Felipa Eth 02/05/2015. Patient states she is ready to start home PT services with [place].  States she does want to know the cost prior to starting services.  Patient requested a "[place] female" PT only. Caregiver: Daughter, Melanie Walker cell # [phone].  HTN Weight 98 lbs BP on last MD appt 126/68. C/O Neuropathy in feet; states managed by Neurology appt once a year. Next appt due 03/2015.  Patient states Dr. Felipa Eth advised patient home BP cuff is not needed.  Medications Less than 10 Flu Vaccine: Refused; states "makes me sick". Per Epic received 11/05/2014 Pneumonia Vaccine: patient unsure   Plan / Intervention:  Initial Assessment completed 02/08/2015 Program: Other HTN  Fall risk RN CM will follow progress and coordinate Home PT  services as needed. RN CM will notify Dr. Felipa Eth patient is ready to start home PT services:  Please send order for HSE; assess for home PT needs to [place]. Patient requested a "[place] female" PT only.  Flu and Pneumonia vaccine.  Emmi Education reviewed:  -Pneumonia - Flu Vaccines  RN CM will follow-up with patient within one month and care coordination services as needed.  RN CM advised to please notify MD of any changes in condition prior to scheduled appt's.  RN CM provided contact name and # [phone] or main office # [phone] and 24-hour nurse line # [phone].  RN CM confirmed patient is aware of 911 services for urgent emergency needs.  [name], RN, BSN, [facility], CCM  [employer] Management Coordinator [phone] Direct [phone] Cell [phone] Office [phone] Fax

## 2015-03-11 ENCOUNTER — Other Ambulatory Visit: Payer: Self-pay

## 2015-03-11 DIAGNOSIS — Z9181 History of falling: Secondary | ICD-10-CM

## 2015-03-11 NOTE — Patient Outreach (Signed)
[facility]) Care Management  03/11/2015  Melanie Walker [facility] April 25, 1930 [id number]   Care Coordination   Contact call to Dr. Felipa Eth, office assistant:  [place].  Issue:  Fall Risk; RN CM provided update on mobility, falls and services needed.  RN CM requested [place] PT order for HSE and therapy / rehab / strengthening be called to [place].  RN CM notified of patients request for specific attendant (per 03/07/14 note).     [name], RN, BSN, [facility], CCM  [employer] Management Coordinator [phone] Direct [phone] Cell [phone] Office [phone] Fax

## 2015-03-12 ENCOUNTER — Ambulatory Visit: Payer: Medicare HMO | Admitting: Nurse Practitioner

## 2015-03-12 ENCOUNTER — Encounter: Payer: Self-pay | Admitting: Neurology

## 2015-03-12 ENCOUNTER — Ambulatory Visit (INDEPENDENT_AMBULATORY_CARE_PROVIDER_SITE_OTHER): Payer: Commercial Managed Care - HMO | Admitting: Neurology

## 2015-03-12 VITALS — BP 157/81 | HR 68 | Ht 59.0 in | Wt 97.0 lb

## 2015-03-12 DIAGNOSIS — M80071D Age-related osteoporosis with current pathological fracture, right ankle and foot, subsequent encounter for fracture with routine healing: Secondary | ICD-10-CM | POA: Diagnosis not present

## 2015-03-12 DIAGNOSIS — R569 Unspecified convulsions: Secondary | ICD-10-CM

## 2015-03-12 DIAGNOSIS — G629 Polyneuropathy, unspecified: Secondary | ICD-10-CM | POA: Diagnosis not present

## 2015-03-12 DIAGNOSIS — R2689 Other abnormalities of gait and mobility: Secondary | ICD-10-CM | POA: Diagnosis not present

## 2015-03-12 DIAGNOSIS — M19042 Primary osteoarthritis, left hand: Secondary | ICD-10-CM | POA: Diagnosis not present

## 2015-03-12 DIAGNOSIS — M19041 Primary osteoarthritis, right hand: Secondary | ICD-10-CM | POA: Diagnosis not present

## 2015-03-12 DIAGNOSIS — E7800 Pure hypercholesterolemia, unspecified: Secondary | ICD-10-CM | POA: Diagnosis not present

## 2015-03-12 DIAGNOSIS — M25562 Pain in left knee: Secondary | ICD-10-CM | POA: Diagnosis not present

## 2015-03-12 DIAGNOSIS — Z9181 History of falling: Secondary | ICD-10-CM

## 2015-03-12 DIAGNOSIS — E559 Vitamin D deficiency, unspecified: Secondary | ICD-10-CM | POA: Diagnosis not present

## 2015-03-12 DIAGNOSIS — R269 Unspecified abnormalities of gait and mobility: Secondary | ICD-10-CM | POA: Diagnosis not present

## 2015-03-12 DIAGNOSIS — G40909 Epilepsy, unspecified, not intractable, without status epilepticus: Secondary | ICD-10-CM | POA: Diagnosis not present

## 2015-03-12 MED ORDER — ZONISAMIDE 100 MG PO CAPS: 200.0000 mg | ORAL_CAPSULE | Freq: Two times a day (BID) | ORAL | Status: DC

## 2015-03-12 NOTE — Progress Notes (Signed)
Chief Complaint  Patient presents with  . Seizures    She is here for her yearly follow up evaluation.  She has not had any seizure activity.  . Numbness    Reports having numbness and cold sensations in her bilateral feet.  She also has coldness in her hands.      [facility]  PATIENT: Melanie Walker DOB: 06/18/1930  HISTORICAL Mr. Leaman is 81 years old right-handed Caucasian female, came in to followup for her complex partial seizure. last clinical visit was in December 2015.  She had past medical history of complex partial seizure,  previous patient of Dr. Ron Parker, last occurrence was in late 1990s, over the years, she was treated with Vigabatrin for extended period of time, she developed visual field deficit, she was switched to zonisamide 100 mg 2 tablets twice a day, she tolerated the medication very well, there is no recurrent seizures.  Previously she also tried Dilantin, Neurontin, Tegretol, Depakote, Felbatol in the past, she could not tolerate it, or it did not control her seizures well.  For many years, she complains of bilateral feet cold sensation, numbness tingling, had EMG nerve conduction twice that was all normal. There was no evidence of large fiber peripheral neuropathy  Today she complains of few months history of low back pain and shooting along right posterior leg, worsening bilateral feet paresthesia, up to her knee level, mild unsteady gait, she denies bowel and bladder incontinence, she lives alone, independent of living, still driving,  UPDATE Mar 26 2014: She slipped and fell, had left knee fracture in Jan 4th 2015, recovered well after brace, physical therapy. She fell again in April 2015, right ankle drosiflexion, with right foot fracture now improved.   She has no recurrent seizure, tolerating Zonegran 100 mg 2 tablets twice a day   UPDATE Mar 12 2015: She complains of bilateral hand and feet feeling cold, numbness, she has mild gait  difficulty, right ankle, left knee fracture in the past, she has bilateral hand weakness, could not open jars.  She has no incontinence. I have suggested MRI of the lumbar, she is very hesitate to go through the MRI because of claustrophobia, decided not to proceed  REVIEW OF SYSTEMS: Full 14 system review of systems performed and notable only for hearing loss, trouble swallowing, feeling cold, constipation, allergies, runny nose,  ALLERGIES: Allergies  Allergen Reactions  . Biaxin [Clarithromycin]   . Ceftin [Cefuroxime Axetil]   . Dilantin [Phenytoin Sodium Extended]   . Guaifenesin & Derivatives   . Imitrex [Sumatriptan]   . Keflex [Cephalexin]   . Montelukast   . Penicillins   . Premarin [Conjugated Estrogens] Itching  . Sulfa Antibiotics   . Latex Rash    HOME MEDICATIONS: Outpatient Prescriptions Prior to Visit  Medication Sig Dispense Refill  . zonisamide (ZONEGRAN) 100 MG capsule Take 2 capsules (200 mg total) by mouth 2 (two) times daily. 360 capsule 3  . Calcium Carb-Cholecalciferol (OYSTER SHELL CALCIUM/VITAMIN D) 250-125 MG-UNIT TABS     . Salicylic Acid 40 % PADS      No facility-administered medications prior to visit.    PAST MEDICAL HISTORY: Past Medical History  Diagnosis Date  . Seizure ([place])     PAST SURGICAL HISTORY: Past Surgical History  Procedure Laterality Date  . None      FAMILY HISTORY: Family History  Problem Relation Age of Onset  . Heart Problems Mother   . Heart Problems Father  SOCIAL HISTORY:  Social History   Social History  . Marital Status: Widowed    Spouse Name: N/A  . Number of Children: 2  . Years of Education: 12th   Occupational History  .      Retired   Social History Main Topics  . Smoking status: Never Smoker   . Smokeless tobacco: Never Used  . Alcohol Use: No  . Drug Use: No  . Sexual Activity: Not on file   Other Topics Concern  . Not on file   Social History Narrative   Patient lives at home  alone and she is widowed. (Retired)   Education- 12 th grade   Right handed.   Caffeine- two cups of coffee daily.     PHYSICAL EXAM   Filed Vitals:   03/12/15 1002  BP: 157/81  Pulse: 68  Height: '4\' 11"'$  (1.499 m)  Weight: 97 lb (43.999 kg)    Body mass index is 19.58 kg/(m^2).   PHYSICAL EXAMNIATION:  Gen: NAD, conversant, well nourised, obese, well groomed                     Cardiovascular: Regular rate rhythm, no peripheral edema, warm, nontender. Eyes: Conjunctivae clear without exudates or hemorrhage Neck: Supple, no carotid bruise. Pulmonary: Clear to auscultation bilaterally   NEUROLOGICAL EXAM:  MENTAL STATUS: Speech:    Speech is normal; fluent and spontaneous with normal comprehension.  Cognition:     Orientation to time, place and person     Normal recent and remote memory     Normal Attention span and concentration     Normal Language, naming, repeating,spontaneous speech     Fund of knowledge   CRANIAL NERVES: CN II: Visual fields are full to confrontation. Fundoscopic exam is normal with sharp discs and no vascular changes. Pupils are round equal and briskly reactive to light. CN III, IV, VI: extraocular movement are normal. No ptosis. CN V: Facial sensation is intact to pinprick in all 3 divisions bilaterally. Corneal responses are intact.  CN VII: Face is symmetric with normal eye closure and smile. CN VIII: Hearing is normal to rubbing fingers CN IX, X: Palate elevates symmetrically. Phonation is normal. CN XI: Head turning and shoulder shrug are intact CN XII: Tongue is midline with normal movements and no atrophy.  MOTOR: There is no pronator drift of out-stretched arms. Muscle bulk and tone are normal. She has mild bilateral hip flexion knee flexion ankle dorsiflexion weakness  REFLEXES: Reflexes are 2+ and symmetric at the biceps, triceps, absent at knees, and ankles. Plantar responses are flexor.  SENSORY: Length dependent decreased to  light touch pinprick vibratory sensation to midshin level  COORDINATION: Rapid alternating movements and fine finger movements are intact. There is no dysmetria on finger-to-nose and heel-knee-shin.    GAIT/STANCE: She needs to push up to get up from seated position, unsteady wide-based, was not able to stand on tiptoe heels, could not perform tandem walking       DIAGNOSTIC DATA (LABS, IMAGING, TESTING) - I reviewed patient records, labs, notes, testing and imaging myself where available.  ASSESSMENT AND PLAN  Complex partial seizure  May decrease Zonegran from 100 mg 2 tablets twice a day to 100 mg in the morning, and 200 mg at night  Gait abnormality  She complains of bilateral feet paresthesia, was noted to have mild bilateral hip flexion knee flexion ankle dorsiflexion weakness,  Previous EMG nerve conduction study did not show evidence of  peripheral neuropathy  Differentiation diagnosis also includes lumbar sacral radiculopathy have suggested MRI of lumbar, she does not want to go through evaluation at this time  Marcial Pacas, M.D. Ph.D.  [facility] [address], [place], [place] [address] [phone]

## 2015-03-19 DIAGNOSIS — M80071D Age-related osteoporosis with current pathological fracture, right ankle and foot, subsequent encounter for fracture with routine healing: Secondary | ICD-10-CM | POA: Diagnosis not present

## 2015-03-19 DIAGNOSIS — G629 Polyneuropathy, unspecified: Secondary | ICD-10-CM | POA: Diagnosis not present

## 2015-03-19 DIAGNOSIS — M19041 Primary osteoarthritis, right hand: Secondary | ICD-10-CM | POA: Diagnosis not present

## 2015-03-19 DIAGNOSIS — E559 Vitamin D deficiency, unspecified: Secondary | ICD-10-CM | POA: Diagnosis not present

## 2015-03-19 DIAGNOSIS — M19042 Primary osteoarthritis, left hand: Secondary | ICD-10-CM | POA: Diagnosis not present

## 2015-03-19 DIAGNOSIS — G40909 Epilepsy, unspecified, not intractable, without status epilepticus: Secondary | ICD-10-CM | POA: Diagnosis not present

## 2015-03-19 DIAGNOSIS — E7800 Pure hypercholesterolemia, unspecified: Secondary | ICD-10-CM | POA: Diagnosis not present

## 2015-03-19 DIAGNOSIS — R2689 Other abnormalities of gait and mobility: Secondary | ICD-10-CM | POA: Diagnosis not present

## 2015-03-19 DIAGNOSIS — M25562 Pain in left knee: Secondary | ICD-10-CM | POA: Diagnosis not present

## 2015-03-21 DIAGNOSIS — E559 Vitamin D deficiency, unspecified: Secondary | ICD-10-CM | POA: Diagnosis not present

## 2015-03-21 DIAGNOSIS — R2689 Other abnormalities of gait and mobility: Secondary | ICD-10-CM | POA: Diagnosis not present

## 2015-03-21 DIAGNOSIS — M80071D Age-related osteoporosis with current pathological fracture, right ankle and foot, subsequent encounter for fracture with routine healing: Secondary | ICD-10-CM | POA: Diagnosis not present

## 2015-03-21 DIAGNOSIS — G40909 Epilepsy, unspecified, not intractable, without status epilepticus: Secondary | ICD-10-CM | POA: Diagnosis not present

## 2015-03-21 DIAGNOSIS — M25562 Pain in left knee: Secondary | ICD-10-CM | POA: Diagnosis not present

## 2015-03-21 DIAGNOSIS — M19041 Primary osteoarthritis, right hand: Secondary | ICD-10-CM | POA: Diagnosis not present

## 2015-03-21 DIAGNOSIS — M19042 Primary osteoarthritis, left hand: Secondary | ICD-10-CM | POA: Diagnosis not present

## 2015-03-21 DIAGNOSIS — G629 Polyneuropathy, unspecified: Secondary | ICD-10-CM | POA: Diagnosis not present

## 2015-03-21 DIAGNOSIS — E7800 Pure hypercholesterolemia, unspecified: Secondary | ICD-10-CM | POA: Diagnosis not present

## 2015-03-26 DIAGNOSIS — N390 Urinary tract infection, site not specified: Secondary | ICD-10-CM | POA: Diagnosis not present

## 2015-04-02 DIAGNOSIS — M25562 Pain in left knee: Secondary | ICD-10-CM | POA: Diagnosis not present

## 2015-04-02 DIAGNOSIS — G40909 Epilepsy, unspecified, not intractable, without status epilepticus: Secondary | ICD-10-CM | POA: Diagnosis not present

## 2015-04-02 DIAGNOSIS — R2689 Other abnormalities of gait and mobility: Secondary | ICD-10-CM | POA: Diagnosis not present

## 2015-04-02 DIAGNOSIS — E559 Vitamin D deficiency, unspecified: Secondary | ICD-10-CM | POA: Diagnosis not present

## 2015-04-02 DIAGNOSIS — M80071D Age-related osteoporosis with current pathological fracture, right ankle and foot, subsequent encounter for fracture with routine healing: Secondary | ICD-10-CM | POA: Diagnosis not present

## 2015-04-02 DIAGNOSIS — G629 Polyneuropathy, unspecified: Secondary | ICD-10-CM | POA: Diagnosis not present

## 2015-04-02 DIAGNOSIS — M19042 Primary osteoarthritis, left hand: Secondary | ICD-10-CM | POA: Diagnosis not present

## 2015-04-02 DIAGNOSIS — M19041 Primary osteoarthritis, right hand: Secondary | ICD-10-CM | POA: Diagnosis not present

## 2015-04-02 DIAGNOSIS — E7800 Pure hypercholesterolemia, unspecified: Secondary | ICD-10-CM | POA: Diagnosis not present

## 2015-04-04 DIAGNOSIS — M19042 Primary osteoarthritis, left hand: Secondary | ICD-10-CM | POA: Diagnosis not present

## 2015-04-04 DIAGNOSIS — M19041 Primary osteoarthritis, right hand: Secondary | ICD-10-CM | POA: Diagnosis not present

## 2015-04-04 DIAGNOSIS — G40909 Epilepsy, unspecified, not intractable, without status epilepticus: Secondary | ICD-10-CM | POA: Diagnosis not present

## 2015-04-04 DIAGNOSIS — M80071D Age-related osteoporosis with current pathological fracture, right ankle and foot, subsequent encounter for fracture with routine healing: Secondary | ICD-10-CM | POA: Diagnosis not present

## 2015-04-04 DIAGNOSIS — E7800 Pure hypercholesterolemia, unspecified: Secondary | ICD-10-CM | POA: Diagnosis not present

## 2015-04-04 DIAGNOSIS — M25562 Pain in left knee: Secondary | ICD-10-CM | POA: Diagnosis not present

## 2015-04-04 DIAGNOSIS — R2689 Other abnormalities of gait and mobility: Secondary | ICD-10-CM | POA: Diagnosis not present

## 2015-04-04 DIAGNOSIS — E559 Vitamin D deficiency, unspecified: Secondary | ICD-10-CM | POA: Diagnosis not present

## 2015-04-04 DIAGNOSIS — G629 Polyneuropathy, unspecified: Secondary | ICD-10-CM | POA: Diagnosis not present

## 2015-04-05 ENCOUNTER — Ambulatory Visit: Payer: Commercial Managed Care - HMO

## 2015-04-05 ENCOUNTER — Ambulatory Visit: Payer: Self-pay

## 2015-04-09 ENCOUNTER — Other Ambulatory Visit: Payer: Self-pay

## 2015-04-09 DIAGNOSIS — M19041 Primary osteoarthritis, right hand: Secondary | ICD-10-CM | POA: Diagnosis not present

## 2015-04-09 DIAGNOSIS — R2689 Other abnormalities of gait and mobility: Secondary | ICD-10-CM | POA: Diagnosis not present

## 2015-04-09 DIAGNOSIS — E559 Vitamin D deficiency, unspecified: Secondary | ICD-10-CM | POA: Diagnosis not present

## 2015-04-09 DIAGNOSIS — E7800 Pure hypercholesterolemia, unspecified: Secondary | ICD-10-CM | POA: Diagnosis not present

## 2015-04-09 DIAGNOSIS — M80071D Age-related osteoporosis with current pathological fracture, right ankle and foot, subsequent encounter for fracture with routine healing: Secondary | ICD-10-CM | POA: Diagnosis not present

## 2015-04-09 DIAGNOSIS — G40909 Epilepsy, unspecified, not intractable, without status epilepticus: Secondary | ICD-10-CM | POA: Diagnosis not present

## 2015-04-09 DIAGNOSIS — M19042 Primary osteoarthritis, left hand: Secondary | ICD-10-CM | POA: Diagnosis not present

## 2015-04-09 DIAGNOSIS — M25562 Pain in left knee: Secondary | ICD-10-CM | POA: Diagnosis not present

## 2015-04-09 DIAGNOSIS — G629 Polyneuropathy, unspecified: Secondary | ICD-10-CM | POA: Diagnosis not present

## 2015-04-09 NOTE — Patient Outreach (Signed)
[facility]) Care Management  04/09/2015  Melanie Walker [facility] December 14, 1930 [id number]  Outreach call #1 to patient for telephonic monthly assessment.  Patient request call back another time.  States currently working with Home Physical Therapist.   Plan: RN CM scheduled for next contact within one week.   Melanie Laster, RN, BSN, [facility], CCM  [employer] Management Coordinator [phone] Direct [phone] Cell [phone] Office [phone] Fax

## 2015-04-10 ENCOUNTER — Other Ambulatory Visit: Payer: Self-pay

## 2015-04-10 VITALS — BP 110/60 | Ht <= 58 in | Wt 96.0 lb

## 2015-04-10 DIAGNOSIS — Z9181 History of falling: Secondary | ICD-10-CM

## 2015-04-10 NOTE — Patient Outreach (Signed)
[facility]) Care Management  04/10/2015  Melanie Walker 07/01/1930 [id number]  Telephone Monthly Assessment   Referral Date: 01/18/2015 Referral Source: [place]  Providers: PCP: Dr. Felipa Eth - last appt 02/08/15 and yearly Neurologist: Dr. Krista Blue - next appt 03/12/15 and yearly HHS: [place]: [facility] HMO  Social: Patient lives in her home alone.  Mobility: High risk for falls.   Falls: 2-3 with injuries.  No new falls reported since last contact call 03/08/2015.  Active with [facility] PT services and states making progress.   H/o needs more rehab to strengthen her legs and ankles. H/o ankle fracture. H/o  last received rehab services with [facility] but sustained another fall since that time and has never returned back to where she was. Patient elected to delay starting PT services until after 02/17/2015 due to needing time to spend with grandson who is home from the [employer]. H/o RN CM notified Dr. Felipa Eth 01/25/2015 of patient's request. Last MD visit with Dr. Felipa Eth 02/05/2015. Patient initiated [facility] PT services 03/2015.  Caregiver: Daughter, Sherran Margolis cell # [phone].  HTN Weight 96 lbs BP on last MD appt 110/60 Patient states Dr. Felipa Eth advised patient home BP cuff is not needed.  Medications Less than 10 Flu Vaccine: Refused; states "makes me sick". Per Epic received 11/05/2014 PPSV23:  04/06/1997  Plan / Intervention:  Initial Assessment completed 02/08/2015 Program: Other HTN  RN CM identifies patient with no further case management needs.  BP controlled and services active for home PT.  RN CM advised in case closure.  RN CM advised patient should new needs arise to contact Primary MD to discuss and MD can make new referral to [facility]. RN CM notified [place] Assistant:  Case closed, goals of care met.  RN CM notified Dr. Felipa Eth via case closure letter.  RN CM advised to please notify MD of  any changes in condition prior to scheduled appt's.  RN CM confirmed patient is aware of 911 services for urgent emergency needs.  [name], RN, BSN, [facility], CCM  [employer] Management Coordinator [phone] Direct [phone] Cell [phone] Office [phone] Fax

## 2015-04-12 DIAGNOSIS — M80071D Age-related osteoporosis with current pathological fracture, right ankle and foot, subsequent encounter for fracture with routine healing: Secondary | ICD-10-CM | POA: Diagnosis not present

## 2015-04-12 DIAGNOSIS — E559 Vitamin D deficiency, unspecified: Secondary | ICD-10-CM | POA: Diagnosis not present

## 2015-04-12 DIAGNOSIS — R2689 Other abnormalities of gait and mobility: Secondary | ICD-10-CM | POA: Diagnosis not present

## 2015-04-12 DIAGNOSIS — M19041 Primary osteoarthritis, right hand: Secondary | ICD-10-CM | POA: Diagnosis not present

## 2015-04-12 DIAGNOSIS — E7800 Pure hypercholesterolemia, unspecified: Secondary | ICD-10-CM | POA: Diagnosis not present

## 2015-04-12 DIAGNOSIS — G629 Polyneuropathy, unspecified: Secondary | ICD-10-CM | POA: Diagnosis not present

## 2015-04-12 DIAGNOSIS — M25562 Pain in left knee: Secondary | ICD-10-CM | POA: Diagnosis not present

## 2015-04-12 DIAGNOSIS — G40909 Epilepsy, unspecified, not intractable, without status epilepticus: Secondary | ICD-10-CM | POA: Diagnosis not present

## 2015-04-12 DIAGNOSIS — M19042 Primary osteoarthritis, left hand: Secondary | ICD-10-CM | POA: Diagnosis not present

## 2015-04-19 DIAGNOSIS — M19041 Primary osteoarthritis, right hand: Secondary | ICD-10-CM | POA: Diagnosis not present

## 2015-04-19 DIAGNOSIS — G40909 Epilepsy, unspecified, not intractable, without status epilepticus: Secondary | ICD-10-CM | POA: Diagnosis not present

## 2015-04-19 DIAGNOSIS — G629 Polyneuropathy, unspecified: Secondary | ICD-10-CM | POA: Diagnosis not present

## 2015-04-19 DIAGNOSIS — M80071D Age-related osteoporosis with current pathological fracture, right ankle and foot, subsequent encounter for fracture with routine healing: Secondary | ICD-10-CM | POA: Diagnosis not present

## 2015-04-19 DIAGNOSIS — E7800 Pure hypercholesterolemia, unspecified: Secondary | ICD-10-CM | POA: Diagnosis not present

## 2015-04-19 DIAGNOSIS — R2689 Other abnormalities of gait and mobility: Secondary | ICD-10-CM | POA: Diagnosis not present

## 2015-04-19 DIAGNOSIS — E559 Vitamin D deficiency, unspecified: Secondary | ICD-10-CM | POA: Diagnosis not present

## 2015-04-19 DIAGNOSIS — M19042 Primary osteoarthritis, left hand: Secondary | ICD-10-CM | POA: Diagnosis not present

## 2015-04-19 DIAGNOSIS — M25562 Pain in left knee: Secondary | ICD-10-CM | POA: Diagnosis not present

## 2015-06-04 ENCOUNTER — Telehealth: Payer: Self-pay | Admitting: *Deleted

## 2015-06-04 NOTE — Telephone Encounter (Signed)
Pt states she would like to know what could be done for a plantar wart.  Pt states she had the wart removed before, but has returned.  I told pt depending on the area and size of the wart, treatment would be decided.  Pt states she would need a referral from her PCP because she has [employer], and call again.

## 2015-06-28 ENCOUNTER — Ambulatory Visit: Payer: Commercial Managed Care - HMO | Admitting: Podiatry

## 2015-07-22 ENCOUNTER — Ambulatory Visit: Payer: Commercial Managed Care - HMO | Admitting: Podiatry

## 2015-08-07 DIAGNOSIS — M810 Age-related osteoporosis without current pathological fracture: Secondary | ICD-10-CM | POA: Diagnosis not present

## 2015-08-07 DIAGNOSIS — L989 Disorder of the skin and subcutaneous tissue, unspecified: Secondary | ICD-10-CM | POA: Diagnosis not present

## 2015-08-07 DIAGNOSIS — I10 Essential (primary) hypertension: Secondary | ICD-10-CM | POA: Diagnosis not present

## 2015-08-07 DIAGNOSIS — I7300 Raynaud's syndrome without gangrene: Secondary | ICD-10-CM | POA: Diagnosis not present

## 2015-08-12 ENCOUNTER — Ambulatory Visit: Payer: Commercial Managed Care - HMO | Admitting: Podiatry

## 2015-08-12 DIAGNOSIS — L84 Corns and callosities: Secondary | ICD-10-CM | POA: Diagnosis not present

## 2015-08-12 DIAGNOSIS — Z85828 Personal history of other malignant neoplasm of skin: Secondary | ICD-10-CM | POA: Diagnosis not present

## 2015-08-12 DIAGNOSIS — C44719 Basal cell carcinoma of skin of left lower limb, including hip: Secondary | ICD-10-CM | POA: Diagnosis not present

## 2015-10-23 DIAGNOSIS — Z961 Presence of intraocular lens: Secondary | ICD-10-CM | POA: Diagnosis not present

## 2015-10-23 DIAGNOSIS — H10413 Chronic giant papillary conjunctivitis, bilateral: Secondary | ICD-10-CM | POA: Diagnosis not present

## 2015-10-23 DIAGNOSIS — H35383 Toxic maculopathy, bilateral: Secondary | ICD-10-CM | POA: Diagnosis not present

## 2015-10-23 DIAGNOSIS — H04123 Dry eye syndrome of bilateral lacrimal glands: Secondary | ICD-10-CM | POA: Diagnosis not present

## 2015-10-23 DIAGNOSIS — H40013 Open angle with borderline findings, low risk, bilateral: Secondary | ICD-10-CM | POA: Diagnosis not present

## 2015-10-23 DIAGNOSIS — H1851 Endothelial corneal dystrophy: Secondary | ICD-10-CM | POA: Diagnosis not present

## 2015-10-23 DIAGNOSIS — H35373 Puckering of macula, bilateral: Secondary | ICD-10-CM | POA: Diagnosis not present

## 2015-11-15 ENCOUNTER — Other Ambulatory Visit: Payer: Self-pay | Admitting: Geriatric Medicine

## 2015-11-15 DIAGNOSIS — Z1231 Encounter for screening mammogram for malignant neoplasm of breast: Secondary | ICD-10-CM

## 2015-11-26 DIAGNOSIS — H5203 Hypermetropia, bilateral: Secondary | ICD-10-CM | POA: Diagnosis not present

## 2015-11-26 DIAGNOSIS — H5210 Myopia, unspecified eye: Secondary | ICD-10-CM | POA: Diagnosis not present

## 2016-01-01 ENCOUNTER — Other Ambulatory Visit: Payer: Self-pay | Admitting: Geriatric Medicine

## 2016-01-01 ENCOUNTER — Ambulatory Visit
Admission: RE | Admit: 2016-01-01 | Discharge: 2016-01-01 | Disposition: A | Discharge status: Home / Self Care | Payer: Commercial Managed Care - HMO | Source: Ambulatory Visit | Attending: Geriatric Medicine | Admitting: Geriatric Medicine

## 2016-01-01 DIAGNOSIS — I10 Essential (primary) hypertension: Secondary | ICD-10-CM | POA: Diagnosis not present

## 2016-01-01 DIAGNOSIS — Z1231 Encounter for screening mammogram for malignant neoplasm of breast: Secondary | ICD-10-CM

## 2016-01-01 DIAGNOSIS — Z0000 Encounter for general adult medical examination without abnormal findings: Secondary | ICD-10-CM | POA: Diagnosis not present

## 2016-01-01 DIAGNOSIS — E7800 Pure hypercholesterolemia, unspecified: Secondary | ICD-10-CM | POA: Diagnosis not present

## 2016-01-01 DIAGNOSIS — Z79899 Other long term (current) drug therapy: Secondary | ICD-10-CM | POA: Diagnosis not present

## 2016-01-01 DIAGNOSIS — R0989 Other specified symptoms and signs involving the circulatory and respiratory systems: Secondary | ICD-10-CM

## 2016-01-01 DIAGNOSIS — L820 Inflamed seborrheic keratosis: Secondary | ICD-10-CM | POA: Diagnosis not present

## 2016-01-01 DIAGNOSIS — R918 Other nonspecific abnormal finding of lung field: Secondary | ICD-10-CM | POA: Diagnosis not present

## 2016-01-01 DIAGNOSIS — Z1389 Encounter for screening for other disorder: Secondary | ICD-10-CM | POA: Diagnosis not present

## 2016-01-20 DIAGNOSIS — L570 Actinic keratosis: Secondary | ICD-10-CM | POA: Diagnosis not present

## 2016-01-20 DIAGNOSIS — L821 Other seborrheic keratosis: Secondary | ICD-10-CM | POA: Diagnosis not present

## 2016-01-20 DIAGNOSIS — B353 Tinea pedis: Secondary | ICD-10-CM | POA: Diagnosis not present

## 2016-01-20 DIAGNOSIS — Z85828 Personal history of other malignant neoplasm of skin: Secondary | ICD-10-CM | POA: Diagnosis not present

## 2016-01-27 ENCOUNTER — Telehealth: Payer: Self-pay | Admitting: Neurology

## 2016-01-27 ENCOUNTER — Other Ambulatory Visit: Payer: Self-pay | Admitting: *Deleted

## 2016-01-27 MED ORDER — ZONISAMIDE 100 MG PO CAPS: 200.0000 mg | ORAL_CAPSULE | Freq: Two times a day (BID) | ORAL | 0 refills | Status: DC

## 2016-01-27 NOTE — Telephone Encounter (Signed)
Pt request refill for zonisamide (ZONEGRAN) 100 MG capsule sent to [facility].

## 2016-01-27 NOTE — Telephone Encounter (Signed)
Rx sent to pharmacy.  Patient has a pending appt.

## 2016-02-11 ENCOUNTER — Other Ambulatory Visit: Payer: Self-pay | Admitting: *Deleted

## 2016-02-11 ENCOUNTER — Telehealth: Payer: Self-pay | Admitting: Neurology

## 2016-02-11 NOTE — Telephone Encounter (Signed)
Spoke to patient - her appt has been moved up to 02/12/16.

## 2016-02-11 NOTE — Telephone Encounter (Signed)
Pt called in about changing her medication. She says she is having a lot of trouble with her feet and legs. She says they are burning , painful and cramping. She has trouble walking due to this.Please call and advise [phone]

## 2016-02-12 ENCOUNTER — Ambulatory Visit (INDEPENDENT_AMBULATORY_CARE_PROVIDER_SITE_OTHER): Payer: Commercial Managed Care - HMO | Admitting: Neurology

## 2016-02-12 ENCOUNTER — Encounter: Payer: Self-pay | Admitting: Neurology

## 2016-02-12 VITALS — BP 170/81 | HR 78 | Ht <= 58 in | Wt 97.5 lb

## 2016-02-12 DIAGNOSIS — G40209 Localization-related (focal) (partial) symptomatic epilepsy and epileptic syndromes with complex partial seizures, not intractable, without status epilepticus: Secondary | ICD-10-CM

## 2016-02-12 DIAGNOSIS — R269 Unspecified abnormalities of gait and mobility: Secondary | ICD-10-CM | POA: Diagnosis not present

## 2016-02-12 DIAGNOSIS — R569 Unspecified convulsions: Secondary | ICD-10-CM | POA: Diagnosis not present

## 2016-02-12 DIAGNOSIS — R202 Paresthesia of skin: Secondary | ICD-10-CM | POA: Insufficient documentation

## 2016-02-12 MED ORDER — ALPRAZOLAM 0.5 MG PO TABS: ORAL_TABLET | ORAL | 0 refills | Status: DC

## 2016-02-12 MED ORDER — ZONISAMIDE 100 MG PO CAPS: 100.0000 mg | ORAL_CAPSULE | Freq: Two times a day (BID) | ORAL | 4 refills | Status: DC

## 2016-02-12 MED ORDER — LIDOCAINE-PRILOCAINE 2.5-2.5 % EX CREA: 1.0000 "application " | TOPICAL_CREAM | CUTANEOUS | 6 refills | Status: DC | PRN

## 2016-02-12 NOTE — Progress Notes (Signed)
Chief Complaint  Patient presents with  . Seizures    She is here with her daughter, [name].  No seizure activity reported.  . Pain in feet    States she has had worsening pain in her feet for years.  She is now experiencing burning, numbness and tingling.  She is having a hard time wearing shoes.  Her symptoms are causing her difficulty walking.      [facility]  PATIENT: Melanie Walker DOB: December 31, 1930  HISTORICAL Mr. Suchocki is 82 years old right-handed Caucasian female, came in to followup for her complex partial seizure. last clinical visit was in December 2015.  She had past medical history of complex partial seizure,  previous patient of Dr. Ron Parker, last occurrence was in late 1990s, over the years, she was treated with Vigabatrin for extended period of time, she developed visual field deficit, she was switched to zonisamide 100 mg 2 tablets twice a day, she tolerated the medication very well, there is no recurrent seizures.  Previously she also tried Dilantin, Neurontin, Tegretol, Depakote, Felbatol in the past, she could not tolerate it, or it did not control her seizures well.  For many years, she complains of bilateral feet cold sensation, numbness tingling, had EMG nerve conduction twice that was all normal. There was no evidence of large fiber peripheral neuropathy  Today she complains of few months history of low back pain and shooting along right posterior leg, worsening bilateral feet paresthesia, up to her knee level, mild unsteady gait, she denies bowel and bladder incontinence, she lives alone, independent of living, still driving,  UPDATE Mar 26 2014: She slipped and fell, had left knee fracture in Jan 4th 2015, recovered well after brace, physical therapy. She fell again in April 2015, right ankle drosiflexion, with right foot fracture now improved.   She has no recurrent seizure, tolerating Zonegran 100 mg 2 tablets twice a day   UPDATE Mar 12 2015: She complains of bilateral hand and feet feeling cold, numbness, she has mild gait difficulty, right ankle, left knee fracture in the past, she has bilateral hand weakness, could not open jars.  She has no incontinence. I have suggested MRI of the lumbar, she is very hesitate to go through the MRI because of claustrophobia, decided not to proceed  UPDATE Nov 8th 2017: She is on lower dose of zonisamide 100 mg in the morning, 200 mg every night since December 2016, no recurrent seizure,  Today she came in with her daughter with complaints of worsening bilateral feet pain, unsteady gait, bilateral hands feel cold, turns blue, was diagnosed with Raynaud's phenomenon. She denies significant low back pain, no bowel bladder incontinence, she does woke up twice every night using bathroom.  REVIEW OF SYSTEMS: Full 14 system review of systems performed and notable only for she will, fatigue, hearing loss, runny nose, cold intolerance, restless leg, insomnia, frequent wakening, environmental and food allergy, joint pain, walking difficulty, bruise easily, numbness, depression  ALLERGIES: Allergies  Allergen Reactions  . Biaxin [Clarithromycin]   . Ceftin [Cefuroxime Axetil]   . Dilantin [Phenytoin Sodium Extended]   . Guaifenesin & Derivatives   . Imitrex [Sumatriptan]   . Keflex [Cephalexin]   . Montelukast   . Penicillins   . Premarin [Conjugated Estrogens] Itching  . Sulfa Antibiotics   . Latex Rash    HOME MEDICATIONS: Outpatient Medications Prior to Visit  Medication Sig Dispense Refill  . amLODipine (NORVASC) 2.5 MG tablet 2.5 mg daily.  7  . Vitamin D, Cholecalciferol, 1000 UNITS CAPS 1,000 Units daily.    Marland Kitchen zonisamide (ZONEGRAN) 100 MG capsule Take 2 capsules (200 mg total) by mouth 2 (two) times daily. 360 capsule 0  . Calcium Carb-Cholecalciferol (OYSTER SHELL CALCIUM/VITAMIN D PO) Take one '500mg'$ -600iu tablet daily.    . Chlorpheniramine Maleate (ALLERGY PO) Take by mouth.      No facility-administered medications prior to visit.     PAST MEDICAL HISTORY: Past Medical History:  Diagnosis Date  . Seizure ([place])     PAST SURGICAL HISTORY: Past Surgical History:  Procedure Laterality Date  . none      FAMILY HISTORY: Family History  Problem Relation Age of Onset  . Heart Problems Mother   . Heart Problems Father     SOCIAL HISTORY:  Social History   Social History  . Marital status: Widowed    Spouse name: N/A  . Number of children: 2  . Years of education: 12th   Occupational History  .      Retired   Social History Main Topics  . Smoking status: Never Smoker  . Smokeless tobacco: Never Used  . Alcohol use No  . Drug use: No  . Sexual activity: Not on file   Other Topics Concern  . Not on file   Social History Narrative   Patient lives at home alone and she is widowed. (Retired)   Education- 12 th grade   Right handed.   Caffeine- two cups of coffee daily.     PHYSICAL EXAM   Vitals:   02/12/16 1555  BP: (!) 170/81  Pulse: 78  Weight: 97 lb 8 oz (44.2 kg)  Height: '4\' 10"'$  (1.473 m)    Body mass index is 20.38 kg/m.   PHYSICAL EXAMNIATION:  Gen: NAD, conversant, well nourised, obese, well groomed                     Cardiovascular: Regular rate rhythm, no peripheral edema, warm, nontender. Eyes: Conjunctivae clear without exudates or hemorrhage Neck: Supple, no carotid bruise. Pulmonary: Clear to auscultation bilaterally   NEUROLOGICAL EXAM:  MENTAL STATUS: Speech:    Speech is normal; fluent and spontaneous with normal comprehension.  Cognition:     Orientation to time, place and person     Normal recent and remote memory     Normal Attention span and concentration     Normal Language, naming, repeating,spontaneous speech     Fund of knowledge   CRANIAL NERVES: CN II: Visual fields are full to confrontation. Fundoscopic exam is normal with sharp discs and no vascular changes. Pupils are round equal and  briskly reactive to light. CN III, IV, VI: extraocular movement are normal. No ptosis. CN V: Facial sensation is intact to pinprick in all 3 divisions bilaterally. Corneal responses are intact.  CN VII: Face is symmetric with normal eye closure and smile. CN VIII: Hearing is normal to rubbing fingers CN IX, X: Palate elevates symmetrically. Phonation is normal. CN XI: Head turning and shoulder shrug are intact CN XII: Tongue is midline with normal movements and no atrophy.  MOTOR: She has mild bilateral hip flexion knee flexion ankle dorsiflexion weakness, moderate toe extension weakness, right worse than left  REFLEXES: Reflexes are 2+ and symmetric at the biceps, triceps, absent at knees, and ankles. Plantar responses are flexor.  SENSORY: Length dependent decreased to light touch pinprick vibratory sensation to midshin level  COORDINATION: Rapid alternating movements and fine finger  movements are intact. There is no dysmetria on finger-to-nose and heel-knee-shin.    GAIT/STANCE: She needs to push up to get up from seated position, unsteady wide-based, was not able to stand on tiptoe heels, could not perform tandem walking   DIAGNOSTIC DATA (LABS, IMAGING, TESTING) - I reviewed patient records, labs, notes, testing and imaging myself where available.  ASSESSMENT AND PLAN  Complex partial seizure  May decrease Zonegran further to 100 mg twice a day new prescription was written   Gait abnormality  She complains of bilateral feet paresthesia, was noted to have mild bilateral hip flexion knee flexion ankle dorsiflexion/toe flexion weakness,  Previous EMG nerve conduction study did not show evidence of peripheral neuropathy  Differentiation diagnosis also includes lumbar sacral radiculopathy   I ordered MRI of lumbar, repeat EMG nerve conduction study   Bilateral lower extremity feet neuropathic pain  Lidocaine,pilocaine as needed  Marcial Pacas, M.D. Ph.D.  [facility] [address] [place], [place] [address] [phone]

## 2016-02-18 ENCOUNTER — Telehealth: Payer: Self-pay | Admitting: Neurology

## 2016-02-18 NOTE — Telephone Encounter (Signed)
Patient called to advise [facility] has not received Rx for ALPRAZolam Duanne Moron) 0.5 MG tablet, patient requests that this Rx be sent to [facility], [place]

## 2016-02-19 ENCOUNTER — Other Ambulatory Visit: Payer: Self-pay | Admitting: *Deleted

## 2016-02-19 DIAGNOSIS — H905 Unspecified sensorineural hearing loss: Secondary | ICD-10-CM | POA: Diagnosis not present

## 2016-02-19 MED ORDER — ALPRAZOLAM 0.5 MG PO TABS: ORAL_TABLET | ORAL | 0 refills | Status: DC

## 2016-02-19 NOTE — Telephone Encounter (Signed)
Spoke to patient - she was not upset - just confused.  She needs the prescription faxed over to [facility].  New rx printed, signed and faxed over for her today.  She is aware to pick up.

## 2016-02-19 NOTE — Telephone Encounter (Signed)
Pt called back today to advise this medication is for MRI appt on 11/22.  She wants to know why it did not go thru [facility]. Pt was advised yesterday it had been refused by [facility]. I asked her if she called [facility], she said somebody did, I know it is there, I just want to know why it didn't go thru [facility]. Pt is aware it is for 2 pills.Pt is upset. Please call

## 2016-02-26 ENCOUNTER — Ambulatory Visit
Admission: RE | Admit: 2016-02-26 | Discharge: 2016-02-26 | Disposition: A | Discharge status: Home / Self Care | Payer: Commercial Managed Care - HMO | Source: Ambulatory Visit | Attending: Neurology | Admitting: Neurology

## 2016-02-26 DIAGNOSIS — M5126 Other intervertebral disc displacement, lumbar region: Secondary | ICD-10-CM | POA: Diagnosis not present

## 2016-02-26 DIAGNOSIS — R269 Unspecified abnormalities of gait and mobility: Secondary | ICD-10-CM

## 2016-02-26 DIAGNOSIS — G40209 Localization-related (focal) (partial) symptomatic epilepsy and epileptic syndromes with complex partial seizures, not intractable, without status epilepticus: Secondary | ICD-10-CM

## 2016-02-26 DIAGNOSIS — R202 Paresthesia of skin: Secondary | ICD-10-CM

## 2016-02-26 DIAGNOSIS — R569 Unspecified convulsions: Secondary | ICD-10-CM

## 2016-03-02 ENCOUNTER — Telehealth: Payer: Self-pay | Admitting: Neurology

## 2016-03-02 NOTE — Telephone Encounter (Signed)
  Please call patient, MRI of the lumbar showed marked degenerative changes most prominent at L4-5, causing bilateral foraminal narrowing, continue EMG nerve conduction study as previously scheduled on March 27 2016, I will review MRI in detail with her then  IMPRESSION:  Abnormal MRI scan of the lumbar spine showing marked degenerative changes throughout most prominent at L4-5 where there is prominent spondylolisthetic as well as degenerative change resulting in severe bilateral foraminal  And mild canal narrowing and possible encroachment on exiting L5 nerve roots.

## 2016-03-02 NOTE — Telephone Encounter (Signed)
Spoke to patient - she is aware of results and will keep her pending NCV/EMG appointment. 

## 2016-03-05 ENCOUNTER — Encounter: Payer: Commercial Managed Care - HMO | Admitting: Neurology

## 2016-03-11 ENCOUNTER — Ambulatory Visit: Payer: Commercial Managed Care - HMO | Admitting: Neurology

## 2016-03-18 ENCOUNTER — Other Ambulatory Visit: Payer: Self-pay | Admitting: *Deleted

## 2016-03-18 NOTE — Telephone Encounter (Signed)
Pt request to speak with RN regarding NCV/EMG test

## 2016-03-18 NOTE — Telephone Encounter (Signed)
Spoke to patient - she is unable to keep her 03/27/16 appt and requests it be rescheduled.  Provided new appt time on 05/15/16.

## 2016-03-27 ENCOUNTER — Encounter: Payer: Commercial Managed Care - HMO | Admitting: Neurology

## 2016-05-15 ENCOUNTER — Ambulatory Visit (INDEPENDENT_AMBULATORY_CARE_PROVIDER_SITE_OTHER): Payer: Medicare HMO | Admitting: Neurology

## 2016-05-15 DIAGNOSIS — R269 Unspecified abnormalities of gait and mobility: Secondary | ICD-10-CM

## 2016-05-15 DIAGNOSIS — M545 Low back pain, unspecified: Secondary | ICD-10-CM

## 2016-05-15 DIAGNOSIS — G40209 Localization-related (focal) (partial) symptomatic epilepsy and epileptic syndromes with complex partial seizures, not intractable, without status epilepticus: Secondary | ICD-10-CM

## 2016-05-15 DIAGNOSIS — R202 Paresthesia of skin: Secondary | ICD-10-CM

## 2016-05-15 DIAGNOSIS — R200 Anesthesia of skin: Secondary | ICD-10-CM

## 2016-05-15 DIAGNOSIS — R569 Unspecified convulsions: Secondary | ICD-10-CM | POA: Diagnosis not present

## 2016-05-15 DIAGNOSIS — G8929 Other chronic pain: Secondary | ICD-10-CM | POA: Diagnosis not present

## 2016-05-15 NOTE — Progress Notes (Signed)
No chief complaint on file.     [facility]  PATIENT: Melanie Walker DOB: 04/27/1930  HISTORICAL Melanie Walker is 83 years old right-handed Caucasian female, came in to followup for her complex partial seizure. last clinical visit was in December 2015.  She had past medical history of complex partial seizure,  previous patient of Dr. Ron Parker, last occurrence was in late 1990s, over the years, she was treated with Vigabatrin for extended period of time, she developed visual field deficit, she was switched to zonisamide 100 mg 2 tablets twice a day, she tolerated the medication very well, there is no recurrent seizures.  Previously she also tried Dilantin, Neurontin, Tegretol, Depakote, Felbatol in the past, she could not tolerate it, or it did not control her seizures well.  For many years, she complains of bilateral feet cold sensation, numbness tingling, had EMG nerve conduction twice that was all normal. There was no evidence of large fiber peripheral neuropathy  Today she complains of few months history of low back pain and shooting along right posterior leg, worsening bilateral feet paresthesia, up to her knee level, mild unsteady gait, she denies bowel and bladder incontinence, she lives alone, independent of living, still driving,  UPDATE Mar 26 2014: She slipped and fell, had left knee fracture in Jan 4th 2015, recovered well after brace, physical therapy. She fell again in April 2015, right ankle drosiflexion, with right foot fracture now improved.   She has no recurrent seizure, tolerating Zonegran 100 mg 2 tablets twice a day   UPDATE Mar 12 2015: She complains of bilateral hand and feet feeling cold, numbness, she has mild gait difficulty, right ankle, left knee fracture in the past, she has bilateral hand weakness, could not open jars.  She has no incontinence. I have suggested MRI of the lumbar, she is very hesitate to go through the MRI because of claustrophobia,  decided not to proceed  UPDATE Nov 8th 2017: She is on lower dose of zonisamide 100 mg in the morning, 200 mg every night since December 2016, no recurrent seizure,  Today she came in with her daughter with complaints of worsening bilateral feet pain, unsteady gait, bilateral hands feel cold, turns blue, was diagnosed with Raynaud's phenomenon. She denies significant low back pain, no bowel bladder incontinence, she does woke up twice every night using bathroom.  Update May 15 2016. She is with her daughter at today's electrodiagnostic study today, which showed no evidence of large fiber peripheral neuropathy, evidence of mild chronic lumbar sacral radiculopathy, she has no evidence of active denervation.  She does has mild bilateral toe extension flexion weakness, mild gait abnormality,  We have personally reviewed MRI of lumbar spine in November 2017, multilevel degenerative disc disease, most severe at L4-5, with moderate canal, moderate bilateral foraminal stenosis, due to disc protrusion, facet joint disease, hypertrophy of ligament flavum,  She has chronic low back pain, but denies significant radiating pain to bilateral lower extremity, denies significant bowel and bladder incontinence,  Today her daughter also concerned about her driving ability, she had one incident of left foot slipped off the pedals, she does have bilateral feet paresthesia, loss of proprioception, she had no recurrent seizure, tolerating Zonegran 100 mg twice a day  REVIEW OF SYSTEMS: Full 14 system review of systems performed and notable only for as above   ALLERGIES: Allergies  Allergen Reactions  . Biaxin [Clarithromycin]   . Ceftin [Cefuroxime Axetil]   . Dilantin [Phenytoin Sodium Extended]   .  Guaifenesin & Derivatives   . Imitrex [Sumatriptan]   . Keflex [Cephalexin]   . Montelukast   . Penicillins   . Premarin [Conjugated Estrogens] Itching  . Sulfa Antibiotics   . Latex Rash    HOME  MEDICATIONS: Outpatient Medications Prior to Visit  Medication Sig Dispense Refill  . ALPRAZolam (XANAX) 0.5 MG tablet Take 1 tablet 30 minutes prior MRI.  May repeat with 1 additional tablet prior to entering scanner, if needed. MUST HAVE DRIVER. 2 tablet 0  . amLODipine (NORVASC) 2.5 MG tablet 2.5 mg daily.  7  . Cholecalciferol (VITAMIN D PO) Take by mouth daily.    . fluticasone (FLONASE) 50 MCG/ACT nasal spray Place 2 sprays into both nostrils daily.    Marland Kitchen lidocaine-prilocaine (EMLA) cream Apply 1 application topically as needed. 30 g 6  . terbinafine (LAMISIL) 1 % cream Apply 1 application topically as needed.    . Vitamin D, Cholecalciferol, 1000 UNITS CAPS 1,000 Units daily.    Marland Kitchen zonisamide (ZONEGRAN) 100 MG capsule Take 1 capsule (100 mg total) by mouth 2 (two) times daily. 180 capsule 4   No facility-administered medications prior to visit.     PAST MEDICAL HISTORY: Past Medical History:  Diagnosis Date  . Seizure ([place])     PAST SURGICAL HISTORY: Past Surgical History:  Procedure Laterality Date  . none      FAMILY HISTORY: Family History  Problem Relation Age of Onset  . Heart Problems Mother   . Heart Problems Father     SOCIAL HISTORY:  Social History   Social History  . Marital status: Widowed    Spouse name: N/A  . Number of children: 2  . Years of education: 12th   Occupational History  .      Retired   Social History Main Topics  . Smoking status: Never Smoker  . Smokeless tobacco: Never Used  . Alcohol use No  . Drug use: No  . Sexual activity: Not on file   Other Topics Concern  . Not on file   Social History Narrative   Patient lives at home alone and she is widowed. (Retired)   Education- 12 th grade   Right handed.   Caffeine- two cups of coffee daily.     PHYSICAL EXAM   There were no vitals filed for this visit.  There is no height or weight on file to calculate BMI.   PHYSICAL EXAMNIATION:  Gen: NAD, conversant, well  nourised, obese, well groomed                     Cardiovascular: Regular rate rhythm, no peripheral edema, warm, nontender. Eyes: Conjunctivae clear without exudates or hemorrhage Neck: Supple, no carotid bruise. Pulmonary: Clear to auscultation bilaterally   NEUROLOGICAL EXAM:  MENTAL STATUS: Speech:    Speech is normal; fluent and spontaneous with normal comprehension.  Cognition:     Orientation to time, place and person     Normal recent and remote memory     Normal Attention span and concentration     Normal Language, naming, repeating,spontaneous speech     Fund of knowledge   CRANIAL NERVES: CN II: Visual fields are full to confrontation. Fundoscopic exam is normal with sharp discs and no vascular changes. Pupils are round equal and briskly reactive to light. CN III, IV, VI: extraocular movement are normal. No ptosis. CN V: Facial sensation is intact to pinprick in all 3 divisions bilaterally. Corneal responses are intact.  CN VII: Face is symmetric with normal eye closure and smile. CN VIII: Hearing is normal to rubbing fingers CN IX, X: Palate elevates symmetrically. Phonation is normal. CN XI: Head turning and shoulder shrug are intact CN XII: Tongue is midline with normal movements and no atrophy.  MOTOR: She has mild bilateral hip flexion knee flexion ankle dorsiflexion weakness, moderate toe extension weakness, right worse than left  REFLEXES: Reflexes are 2+ and symmetric at the biceps, triceps, absent at knees, and ankles. Plantar responses are flexor.  SENSORY: Length dependent decreased to light touch pinprick to midshin level, Absent toe vibratory sensation, decreased toe proprioception   COORDINATION: Rapid alternating movements and fine finger movements are intact. There is no dysmetria on finger-to-nose and heel-knee-shin.    GAIT/STANCE: She needs to push up to get up from seated position, unsteady wide-based, was not able to stand on tiptoe heels,  could not perform tandem walking    DIAGNOSTIC DATA (LABS, IMAGING, TESTING) - I reviewed patient records, labs, notes, testing and imaging myself where available.  ASSESSMENT AND PLAN  Complex partial seizure  Continue Zonegran 100 mg twice a day  Gait abnormality   Bilateral lower extremity feet neuropathic pain  Most consistent with mild bilateral lumbar sacral radiculopathy, no evidence of large fiber peripheral neuropathy   Marcial Pacas, M.D. Ph.D.  [facility] [address] [place], [place] [address] [phone]

## 2016-05-15 NOTE — Procedures (Signed)
Full Name: Taelor Waymire Gender: Female MRN #: [id number] Date of Birth: 10/18/1930    Visit Date: 05/15/2016 11:14 Age: 83 Years 54 Months Old Examining Physician: Marcial Pacas, MD  Referring Physician: Krista Blue History: 83 year old female with history of chronic low back pain, progressive bilateral feet paresthesia, gait abnormality.  Summary of test:  Nerve conduction study: Bilateral superficial peroneal, sural sensory responses were normal. Bilateral peroneal to EDB and tibial motor responses showed no significant abnormalities.  Electromyography: Selected needle examination of bilateral lower extremity muscles and bilateral lumbar sacral paraspinal muscles was performed.  There was mild evidence of chronic neuropathic changes mainly involving bilateral tibialis anterior, tibialis posterior.  There was no evidence of active neuropathic process.   Conclusion: This is a mild abnormal study. There is electrodiagnostic evidence of mild chronic bilateral lumbosacral radiculopathy, mainly involving bilateral L4-5 S1 nerve roots. There was no evidence of active process. There was no evidence of large fiber peripheral neuropathy.    ------------------------------- Marcial Pacas, M.D.  [facility] [place], [place] [address] Tel: [phone] Fax: [phone]        [facility]    Nerve / Sites Rec. Site Peak Lat Ref.  Amp Ref. Segments Distance    ms ms V V  cm  R Sural - Ankle (Calf)     Calf Ankle 3.1 ?4.4 19 ?6 Calf - Ankle 14  R Superficial peroneal - Ankle     Lat leg Ankle 4.4 ?4.4 15 ?6 Lat leg - Ankle 14  L Superficial peroneal - Ankle     Lat leg Ankle 3.6 ?4.4 6 ?6 Lat leg - Ankle 14  L Sural - Ankle (Calf)     Calf Ankle 3.1 ?4.4 8 ?6 Calf - Ankle 14             [facility]    Nerve / Sites Muscle Latency Ref. Amplitude Ref. Rel Amp Segments Distance Lat Diff Velocity Ref. Area    ms ms mV mV %  cm ms m/s m/s mVms  R Peroneal - EDB     Ankle EDB  5.8 ?6.5 2.0 ?2.0 100 Ankle - EDB 9    6.4     Fib head EDB 11.5  2.4  117 Fib head - Ankle 24 5.7 42 ?44 7.5     Pop fossa EDB 13.8  2.1  86.6 Pop fossa - Fib head 10 2.3 44 ?44 7.4         Pop fossa - Ankle  8.0     R Tibial - AH     Ankle AH 4.4 ?5.8 5.7 ?4.0 100 Ankle - AH 9    16.6     Pop fossa AH 13.3  4.2  74.1 Pop fossa - Ankle 32 9.0 36 ?41 14.4  L Peroneal - EDB     Ankle EDB 6.1 ?6.5 5.4 ?2.0 100 Ankle - EDB 9    10.7     Fib head EDB 12.2  4.0  74 Fib head - Ankle 24 6.0 40 ?44 8.3     Pop fossa EDB 14.7  3.6  89.5 Pop fossa - Fib head 10 2.5 40 ?44 9.1         Pop fossa - Ankle  8.5     L Tibial - AH     Ankle AH 5.1 ?5.8 3.5 ?4.0 100 Ankle - AH 9    11.2     Pop fossa  AH 14.0  1.7  47.5 Pop fossa - Ankle 32 9.0 36 ?41 5.5             F  Wave    Nerve F Lat Ref.   ms ms  R Tibial - AH 56.1 ?56.0  L Tibial - AH 56.8 ?56.0         EMG full       EMG Summary Table    Spontaneous MUAP Recruitment  Muscle IA Fib PSW Fasc Other Amp Dur. Poly Pattern  L. Tibialis anterior Increased None None None _______ Increased Normal Normal Normal  L. Tibialis posterior Normal None None None _______ Normal Normal Normal Normal  L. Peroneus longus Normal None None None _______ Normal Normal Normal Normal  L. Gastrocnemius (Medial head) Normal None None None _______ Normal Normal Normal Normal  R. Tibialis posterior Normal None None None _______ Normal Normal Normal Normal  R. Tibialis anterior Increased None None None _______ Increased Normal Normal Normal  R. Peroneus longus Normal None None None _______ Normal Normal Normal Normal  R. Gastrocnemius (Medial head) Normal None None None _______ Normal Normal Normal Normal  R. Vastus lateralis Normal None None None _______ Normal Normal Normal Normal  R. Biceps femoris (short head) Normal None None None _______ Normal Normal Normal Normal  R. Gluteus medius Normal None None None _______ Normal Normal Normal Normal  R. Lumbar paraspinals  (mid) Increased None None None _______ Increased Increased 1+ Normal  R. Lumbar paraspinals (low) Normal None None None _______ Normal Normal Normal Normal  L. Lumbar paraspinals (mid) Increased None None None _______ Normal Increased 1+ Normal  L. Lumbar paraspinals (low) Normal None None None _______ Normal Normal Normal Normal

## 2016-08-12 DIAGNOSIS — M545 Low back pain: Secondary | ICD-10-CM | POA: Diagnosis not present

## 2016-08-12 DIAGNOSIS — G8929 Other chronic pain: Secondary | ICD-10-CM | POA: Diagnosis not present

## 2016-08-12 DIAGNOSIS — F5101 Primary insomnia: Secondary | ICD-10-CM | POA: Diagnosis not present

## 2016-08-12 DIAGNOSIS — I10 Essential (primary) hypertension: Secondary | ICD-10-CM | POA: Diagnosis not present

## 2016-08-12 DIAGNOSIS — R152 Fecal urgency: Secondary | ICD-10-CM | POA: Diagnosis not present

## 2016-10-22 DIAGNOSIS — H40013 Open angle with borderline findings, low risk, bilateral: Secondary | ICD-10-CM | POA: Diagnosis not present

## 2016-10-22 DIAGNOSIS — Z961 Presence of intraocular lens: Secondary | ICD-10-CM | POA: Diagnosis not present

## 2016-10-22 DIAGNOSIS — H10413 Chronic giant papillary conjunctivitis, bilateral: Secondary | ICD-10-CM | POA: Diagnosis not present

## 2016-10-22 DIAGNOSIS — H04123 Dry eye syndrome of bilateral lacrimal glands: Secondary | ICD-10-CM | POA: Diagnosis not present

## 2016-10-22 DIAGNOSIS — H35373 Puckering of macula, bilateral: Secondary | ICD-10-CM | POA: Diagnosis not present

## 2016-10-22 DIAGNOSIS — H1851 Endothelial corneal dystrophy: Secondary | ICD-10-CM | POA: Diagnosis not present

## 2016-11-23 DIAGNOSIS — N390 Urinary tract infection, site not specified: Secondary | ICD-10-CM | POA: Diagnosis not present

## 2016-11-23 DIAGNOSIS — L22 Diaper dermatitis: Secondary | ICD-10-CM | POA: Diagnosis not present

## 2016-11-23 DIAGNOSIS — N952 Postmenopausal atrophic vaginitis: Secondary | ICD-10-CM | POA: Diagnosis not present

## 2016-11-23 DIAGNOSIS — N3941 Urge incontinence: Secondary | ICD-10-CM | POA: Diagnosis not present

## 2016-11-23 DIAGNOSIS — R102 Pelvic and perineal pain: Secondary | ICD-10-CM | POA: Diagnosis not present

## 2017-01-20 DIAGNOSIS — Z1389 Encounter for screening for other disorder: Secondary | ICD-10-CM | POA: Diagnosis not present

## 2017-01-20 DIAGNOSIS — Z0000 Encounter for general adult medical examination without abnormal findings: Secondary | ICD-10-CM | POA: Diagnosis not present

## 2017-02-11 ENCOUNTER — Other Ambulatory Visit: Payer: Self-pay | Admitting: Geriatric Medicine

## 2017-02-11 DIAGNOSIS — Z139 Encounter for screening, unspecified: Secondary | ICD-10-CM

## 2017-02-16 ENCOUNTER — Ambulatory Visit: Payer: Medicare HMO | Admitting: Neurology

## 2017-03-12 ENCOUNTER — Ambulatory Visit: Payer: Commercial Managed Care - HMO

## 2017-04-02 ENCOUNTER — Emergency Department (HOSPITAL_COMMUNITY)
Admission: EM | Admit: 2017-04-02 | Discharge: 2017-04-03 | Disposition: A | Discharge status: Home / Self Care | Payer: Medicare HMO | Attending: Emergency Medicine | Admitting: Emergency Medicine

## 2017-04-02 ENCOUNTER — Emergency Department (HOSPITAL_COMMUNITY): Payer: Medicare HMO

## 2017-04-02 ENCOUNTER — Encounter (HOSPITAL_COMMUNITY): Payer: Self-pay | Admitting: Nurse Practitioner

## 2017-04-02 DIAGNOSIS — K5730 Diverticulosis of large intestine without perforation or abscess without bleeding: Secondary | ICD-10-CM | POA: Diagnosis not present

## 2017-04-02 DIAGNOSIS — Z91040 Latex allergy status: Secondary | ICD-10-CM | POA: Diagnosis not present

## 2017-04-02 DIAGNOSIS — I10 Essential (primary) hypertension: Secondary | ICD-10-CM | POA: Diagnosis not present

## 2017-04-02 DIAGNOSIS — K625 Hemorrhage of anus and rectum: Secondary | ICD-10-CM | POA: Diagnosis present

## 2017-04-02 DIAGNOSIS — K922 Gastrointestinal hemorrhage, unspecified: Secondary | ICD-10-CM | POA: Diagnosis not present

## 2017-04-02 DIAGNOSIS — R1031 Right lower quadrant pain: Secondary | ICD-10-CM | POA: Diagnosis not present

## 2017-04-02 DIAGNOSIS — Z79899 Other long term (current) drug therapy: Secondary | ICD-10-CM | POA: Insufficient documentation

## 2017-04-02 DIAGNOSIS — R109 Unspecified abdominal pain: Secondary | ICD-10-CM

## 2017-04-02 DIAGNOSIS — K8020 Calculus of gallbladder without cholecystitis without obstruction: Secondary | ICD-10-CM | POA: Diagnosis not present

## 2017-04-02 LAB — COMPREHENSIVE METABOLIC PANEL
ALT: 11 U/L — AB (ref 14–54)
AST: 22 U/L (ref 15–41)
Albumin: 3.8 g/dL (ref 3.5–5.0)
Alkaline Phosphatase: 68 U/L (ref 38–126)
Anion gap: 7 (ref 5–15)
BILIRUBIN TOTAL: 0.6 mg/dL (ref 0.3–1.2)
BUN: 17 mg/dL (ref 6–20)
CALCIUM: 9.4 mg/dL (ref 8.9–10.3)
CHLORIDE: 103 mmol/L (ref 101–111)
CO2: 24 mmol/L (ref 22–32)
CREATININE: 0.65 mg/dL (ref 0.44–1.00)
Glucose, Bld: 116 mg/dL — ABNORMAL HIGH (ref 65–99)
Potassium: 4.1 mmol/L (ref 3.5–5.1)
Sodium: 134 mmol/L — ABNORMAL LOW (ref 135–145)
TOTAL PROTEIN: 7.1 g/dL (ref 6.5–8.1)

## 2017-04-02 LAB — TYPE AND SCREEN
ABO/RH(D): A POS
ANTIBODY SCREEN: NEGATIVE

## 2017-04-02 LAB — CBC
HCT: 43.4 % (ref 36.0–46.0)
Hemoglobin: 14.3 g/dL (ref 12.0–15.0)
MCH: 30.4 pg (ref 26.0–34.0)
MCHC: 32.9 g/dL (ref 30.0–36.0)
MCV: 92.3 fL (ref 78.0–100.0)
PLATELETS: 318 10*3/uL (ref 150–400)
RBC: 4.7 MIL/uL (ref 3.87–5.11)
RDW: 13.6 % (ref 11.5–15.5)
WBC: 8.4 10*3/uL (ref 4.0–10.5)

## 2017-04-02 MED ORDER — IOPAMIDOL (ISOVUE-300) INJECTION 61%
100.0000 mL | Freq: Once | INTRAVENOUS | Status: AC | PRN
  Administered 2017-04-03: 100 mL via INTRAVENOUS

## 2017-04-02 MED ORDER — IOPAMIDOL (ISOVUE-300) INJECTION 61%
INTRAVENOUS | Status: AC
  Filled 2017-04-02: qty 30

## 2017-04-02 MED ORDER — IOPAMIDOL (ISOVUE-300) INJECTION 61%
INTRAVENOUS | Status: AC
  Filled 2017-04-02: qty 100

## 2017-04-02 MED ORDER — IOPAMIDOL (ISOVUE-300) INJECTION 61%
30.0000 mL | Freq: Once | INTRAVENOUS | Status: AC | PRN
  Administered 2017-04-02: 30 mL via ORAL

## 2017-04-02 NOTE — ED Provider Notes (Signed)
[facility] DEPT Provider Note   CSN: [id number] Arrival date & time: 04/02/17  1558     History   Chief Complaint Chief Complaint  Patient presents with  . Rectal Bleeding  . Abdominal Cramping    HPI Melanie Walker is a 83 y.o. female.  HPI Patient presents to the emergency room for evaluation of rectal bleeding and abdominal cramping.  Patient states she started having episodes of lower abdominal cramping on Monday.  Patient states she had a large diarrhea bowel movement.  She noticed some blood in her stool during that episode.  She did not have any further episodes throughout the rest of the day.  In the next couple of days she noticed some small amounts of blood in her stool.  He has continued to have pain and cramping in the lower abdomen but it is not very severe.  She did not have much of an appetite for the last couple of days although today it has improved.  Patient went to the [place] clinic today.  They suggested she come to the emergency room for further evaluation. Past Medical History:  Diagnosis Date  . Seizure [facility])     Patient Active Problem List   Diagnosis Date Noted  . Paresthesia 02/12/2016  . Abnormality of gait 03/12/2015  . Risk for falls 01/25/2015  . HTN (hypertension) 01/25/2015  . Seizure ([place])   . Complex partial seizure ([place]) 03/08/2013  . Numbness 03/08/2013  . Low back pain 03/08/2013    Past Surgical History:  Procedure Laterality Date  . none      OB History    No data available       Home Medications    Prior to Admission medications   Medication Sig Start Date End Date Taking? Authorizing Provider  amLODipine (NORVASC) 2.5 MG tablet Take 5 mg by mouth daily.  02/27/15  Yes [provider]  Chlorpheniramine Maleate (ALLERGY PO) Take 2 tablets by mouth daily.   Yes [provider]  Vitamin D, Cholecalciferol, 1000 UNITS CAPS 1,000 Units daily. 01/15/15  Yes [provider]  zonisamide (ZONEGRAN) 100 MG capsule Take 1 capsule (100 mg total) by mouth 2 (two) times daily. 02/12/16  Yes Marcial Pacas, MD  ALPRAZolam Duanne Moron) 0.5 MG tablet Take 1 tablet 30 minutes prior MRI.  May repeat with 1 additional tablet prior to entering scanner, if needed. MUST HAVE DRIVER. Patient not taking: Reported on 04/02/2017 02/19/16   Marcial Pacas, MD  lidocaine-prilocaine (EMLA) cream Apply 1 application topically as needed. Patient not taking: Reported on 04/02/2017 02/12/16   Marcial Pacas, MD    Family History Family History  Problem Relation Age of Onset  . Heart Problems Mother   . Heart Problems Father     Social History Social History   Tobacco Use  . Smoking status: Never Smoker  . Smokeless tobacco: Never Used  Substance Use Topics  . Alcohol use: No    Alcohol/week: 0.0 oz  . Drug use: No     Allergies   Biaxin [clarithromycin]; Ceftin [cefuroxime axetil]; Dilantin [phenytoin sodium extended]; Guaifenesin & derivatives; Imitrex [sumatriptan]; Keflex [cephalexin]; Montelukast; Penicillins; Premarin [conjugated estrogens]; Sulfa antibiotics; and Latex   Review of Systems Review of Systems  All other systems reviewed and are negative.    Physical Exam Updated Vital Signs BP (!) 222/88 (BP Location: Left Arm)   Pulse 66   Temp 98.7 F (37.1 C) (Oral)   Resp 20  SpO2 99%   Physical Exam  Constitutional: She appears well-developed and well-nourished. No distress.  HENT:  Head: Normocephalic and atraumatic.  Right Ear: External ear normal.  Left Ear: External ear normal.  Eyes: Conjunctivae are normal. Right eye exhibits no discharge. Left eye exhibits no discharge. No scleral icterus.  Neck: Neck supple. No tracheal deviation present.  Cardiovascular: Normal rate, regular rhythm and intact distal pulses.  Pulmonary/Chest: Effort normal and breath sounds normal. No stridor. No respiratory distress. She has no wheezes. She has no rales.  Abdominal:  Soft. Bowel sounds are normal. She exhibits no distension. There is tenderness ( Mild tenderness in the right lower quadrant). There is no rebound and no guarding.  Musculoskeletal: She exhibits no edema or tenderness.  Neurological: She is alert. She has normal strength. No cranial nerve deficit (no facial droop, extraocular movements intact, no slurred speech) or sensory deficit. She exhibits normal muscle tone. She displays no seizure activity. Coordination normal.  Skin: Skin is warm and dry. No rash noted.  Psychiatric: She has a normal mood and affect.  Nursing note and vitals reviewed.    ED Treatments / Results  Labs (all labs ordered are listed, but only abnormal results are displayed) Labs Reviewed  COMPREHENSIVE METABOLIC PANEL - Abnormal; Notable for the following components:      Result Value   Sodium 134 (*)    Glucose, Bld 116 (*)    ALT 11 (*)    All other components within normal limits  CBC  TYPE AND SCREEN  ABO/RH    Radiology No results found.  Procedures Procedures (including critical care time)  Medications Ordered in ED Medications  iopamidol (ISOVUE-300) 61 % injection (not administered)  iopamidol (ISOVUE-300) 61 % injection (30 mLs Oral Canceled Entry 04/02/17 2317)  iopamidol (ISOVUE-300) 61 % injection 100 mL (not administered)  iopamidol (ISOVUE-300) 61 % injection 30 mL (30 mLs Oral Contrast Given 04/02/17 2319)     Initial Impression / Assessment and Plan / ED Course  I have reviewed the triage vital signs and the nursing notes.  Pertinent labs & imaging results that were available during my care of the patient were reviewed by me and considered in my medical decision making (see chart for details).   Pt presented to the ED for evaluation of rectal bleeding earlier in the week and abdominal pain.  Sx concerning for possible diverticulitis.  Labs reassuring.  Mild ttp on exam, overall appears well but with her age will ct to evaluate  further.  Final Clinical Impressions(s) / ED Diagnoses  pending   Dorie Rank, MD 04/03/17 [phone]

## 2017-04-02 NOTE — ED Triage Notes (Signed)
Pt states she was sent by [facility] her PCP for evaluation of her abdominal cramping and rectal bleeding.

## 2017-04-03 DIAGNOSIS — K8020 Calculus of gallbladder without cholecystitis without obstruction: Secondary | ICD-10-CM | POA: Diagnosis not present

## 2017-04-03 LAB — ABO/RH: ABO/RH(D): A POS

## 2017-04-03 LAB — LIPASE, BLOOD: LIPASE: 38 U/L (ref 11–51)

## 2017-04-03 MED ORDER — AMLODIPINE BESYLATE 5 MG PO TABS
5.0000 mg | ORAL_TABLET | Freq: Once | ORAL | Status: AC
  Administered 2017-04-03: 5 mg via ORAL
  Filled 2017-04-03: qty 1

## 2017-04-03 NOTE — ED Notes (Signed)
Patient transported to CT 

## 2017-04-03 NOTE — ED Notes (Signed)
Went in room to vital signs. Pt. Complains of a lot of pain from bp cuff in both arms and is unable to tolerate cuff to do orthostatic vitals. Reported this to nurse also.

## 2017-04-13 ENCOUNTER — Ambulatory Visit: Payer: Commercial Managed Care - HMO

## 2017-04-15 DIAGNOSIS — K8689 Other specified diseases of pancreas: Secondary | ICD-10-CM | POA: Diagnosis not present

## 2017-04-15 DIAGNOSIS — R109 Unspecified abdominal pain: Secondary | ICD-10-CM | POA: Diagnosis not present

## 2017-05-06 ENCOUNTER — Ambulatory Visit: Payer: Medicare HMO | Admitting: Neurology

## 2017-05-07 DIAGNOSIS — N3000 Acute cystitis without hematuria: Secondary | ICD-10-CM | POA: Diagnosis not present

## 2017-05-07 DIAGNOSIS — R197 Diarrhea, unspecified: Secondary | ICD-10-CM | POA: Diagnosis not present

## 2017-05-13 ENCOUNTER — Other Ambulatory Visit: Payer: Self-pay | Admitting: Neurology

## 2017-05-25 DIAGNOSIS — N3000 Acute cystitis without hematuria: Secondary | ICD-10-CM | POA: Diagnosis not present

## 2017-07-06 ENCOUNTER — Ambulatory Visit: Payer: Medicare HMO | Admitting: Neurology

## 2017-08-10 DIAGNOSIS — K5901 Slow transit constipation: Secondary | ICD-10-CM | POA: Diagnosis not present

## 2017-08-10 DIAGNOSIS — I10 Essential (primary) hypertension: Secondary | ICD-10-CM | POA: Diagnosis not present

## 2017-08-10 DIAGNOSIS — R200 Anesthesia of skin: Secondary | ICD-10-CM | POA: Diagnosis not present

## 2017-08-24 ENCOUNTER — Ambulatory Visit
Admission: RE | Admit: 2017-08-24 | Discharge: 2017-08-24 | Disposition: A | Discharge status: Home / Self Care | Payer: Medicare HMO | Source: Ambulatory Visit | Attending: Geriatric Medicine | Admitting: Geriatric Medicine

## 2017-08-24 DIAGNOSIS — Z139 Encounter for screening, unspecified: Secondary | ICD-10-CM

## 2017-08-24 DIAGNOSIS — Z1231 Encounter for screening mammogram for malignant neoplasm of breast: Secondary | ICD-10-CM | POA: Diagnosis not present

## 2017-09-08 DIAGNOSIS — H9221 Otorrhagia, right ear: Secondary | ICD-10-CM | POA: Diagnosis not present

## 2017-09-08 DIAGNOSIS — R0981 Nasal congestion: Secondary | ICD-10-CM | POA: Diagnosis not present

## 2017-09-08 DIAGNOSIS — R002 Palpitations: Secondary | ICD-10-CM | POA: Diagnosis not present

## 2017-09-08 DIAGNOSIS — L989 Disorder of the skin and subcutaneous tissue, unspecified: Secondary | ICD-10-CM | POA: Diagnosis not present

## 2017-09-08 DIAGNOSIS — I10 Essential (primary) hypertension: Secondary | ICD-10-CM | POA: Diagnosis not present

## 2017-09-30 ENCOUNTER — Encounter: Payer: Self-pay | Admitting: Neurology

## 2017-09-30 ENCOUNTER — Encounter

## 2017-09-30 ENCOUNTER — Ambulatory Visit: Payer: Medicare HMO | Admitting: Neurology

## 2017-09-30 VITALS — BP 156/69 | HR 69 | Ht 59.0 in | Wt 100.0 lb

## 2017-09-30 DIAGNOSIS — R202 Paresthesia of skin: Secondary | ICD-10-CM

## 2017-09-30 DIAGNOSIS — M5416 Radiculopathy, lumbar region: Secondary | ICD-10-CM

## 2017-09-30 DIAGNOSIS — G40209 Localization-related (focal) (partial) symptomatic epilepsy and epileptic syndromes with complex partial seizures, not intractable, without status epilepticus: Secondary | ICD-10-CM

## 2017-09-30 MED ORDER — GABAPENTIN 100 MG PO CAPS: 200.0000 mg | ORAL_CAPSULE | Freq: Three times a day (TID) | ORAL | 11 refills | Status: DC

## 2017-09-30 NOTE — Progress Notes (Signed)
Chief Complaint  Patient presents with  . Numbness    Pt is complaining of numbness in her feet that is getting worse. Pt denies any seizure activity. PCP: Dr.Stoneking      [facility]  PATIENT: Melanie Walker DOB: [birth year]  HISTORICAL Mr. Baldwin is 84 years old right-handed Caucasian female, came in to followup for her complex partial seizure. last clinical visit was in December 2015.  She had past medical history of complex partial seizure,  previous patient of Dr. Ron Parker, last occurrence was in late 1990s, over the years, she was treated with Vigabatrin for extended period of time, she developed visual field deficit, she was switched to zonisamide 100 mg 2 tablets twice a day, she tolerated the medication very well, there is no recurrent seizures.  Previously she also tried Dilantin, Neurontin, Tegretol, Depakote, Felbatol in the past, she could not tolerate it, or it did not control her seizures well.  For many years, she complains of bilateral feet cold sensation, numbness tingling, had EMG nerve conduction twice that was all normal. There was no evidence of large fiber peripheral neuropathy  Today she complains of few months history of low back pain and shooting along right posterior leg, worsening bilateral feet paresthesia, up to her knee level, mild unsteady gait, she denies bowel and bladder incontinence, she lives alone, independent of living, still driving,  UPDATE Mar 26 2014: She slipped and fell, had left knee fracture in Jan 4th 2015, recovered well after brace, physical therapy. She fell again in April 2015, right ankle drosiflexion, with right foot fracture now improved.   She has no recurrent seizure, tolerating Zonegran 100 mg 2 tablets twice a day   UPDATE Mar 12 2015: She complains of bilateral hand and feet feeling cold, numbness, she has mild gait difficulty, right ankle, left knee fracture in the past, she has bilateral hand weakness, could not  open jars.  She has no incontinence. I have suggested MRI of the lumbar, she is very hesitate to go through the MRI because of claustrophobia, decided not to proceed  UPDATE Nov 8th 2017: She is on lower dose of zonisamide 100 mg in the morning, 200 mg every night since December 2016, no recurrent seizure,  Today she came in with her daughter with complaints of worsening bilateral feet pain, unsteady gait, bilateral hands feel cold, turns blue, was diagnosed with Raynaud's phenomenon. She denies significant low back pain, no bowel bladder incontinence, she does woke up twice every night using bathroom.  Update May 15 2016. She is with her daughter at today's electrodiagnostic study today, which showed no evidence of large fiber peripheral neuropathy, evidence of mild chronic lumbar sacral radiculopathy, she has no evidence of active denervation.  She does has mild bilateral toe extension flexion weakness, mild gait abnormality,  We have personally reviewed MRI of lumbar spine in November 2017, multilevel degenerative disc disease, most severe at L4-5, with moderate canal, moderate bilateral foraminal stenosis, due to disc protrusion, facet joint disease, hypertrophy of ligament flavum,  She has chronic low back pain, but denies significant radiating pain to bilateral lower extremity, denies significant bowel and bladder incontinence,  Today her daughter also concerned about her driving ability, she had one incident of left foot slipped off the pedals, she does have bilateral feet paresthesia, loss of proprioception, she had no recurrent seizure, tolerating Zonegran 100 mg twice a day  UPDATE September 30 2017: She is accompanied by her daughter at today's clinical visit,  she has missed multiple previous appointment, she had no recurrent seizure taking zonisamide 100 mg twice a day, today her focuses on her bilateral feet discomfort, coldness, hot, burning sensation, mild worsening gait abnormality,  she denies significant low back pain, she denies bowel and bladder incontinence.  EMG nerve conduction study in February 2018 showed mild chronic lumbar radiculopathy, mainly involving bilateral L4, 5 S1 nerve roots, well-preserved bilateral superficial peroneal, sural sensory responses, there is no evidence of large fiber sensorimotor polyneuropathy  She continue complains of bilateral feet coldness sensation, mild worsening gait abnormality.  REVIEW OF SYSTEMS: Full 14 system review of systems performed and notable only for as above   ALLERGIES: Allergies  Allergen Reactions  . Biaxin [Clarithromycin]   . Ceftin [Cefuroxime Axetil]   . Dilantin [Phenytoin Sodium Extended]   . Guaifenesin & Derivatives   . Imitrex [Sumatriptan]   . Keflex [Cephalexin]   . Montelukast   . Penicillins   . Premarin [Conjugated Estrogens] Itching  . Sulfa Antibiotics   . Latex Rash    HOME MEDICATIONS: Outpatient Medications Prior to Visit  Medication Sig Dispense Refill  . amLODipine (NORVASC) 2.5 MG tablet Take 5 mg by mouth daily.   7  . Chlorpheniramine Maleate (ALLERGY PO) Take 2 tablets by mouth daily. Equate Allergy    . Vitamin D, Cholecalciferol, 1000 UNITS CAPS 1,000 Units daily.    Marland Kitchen zonisamide (ZONEGRAN) 100 MG capsule TAKE 1 CAPSULE TWICE DAILY (PLEASE SCHEDULE ANNUAL APPOINTMENT) 180 capsule 4  . ALPRAZolam (XANAX) 0.5 MG tablet Take 1 tablet 30 minutes prior MRI.  May repeat with 1 additional tablet prior to entering scanner, if needed. MUST HAVE DRIVER. (Patient not taking: Reported on 04/02/2017) 2 tablet 0  . lidocaine-prilocaine (EMLA) cream Apply 1 application topically as needed. (Patient not taking: Reported on 04/02/2017) 30 g 6   No facility-administered medications prior to visit.     PAST MEDICAL HISTORY: Past Medical History:  Diagnosis Date  . Seizure ([place])     PAST SURGICAL HISTORY: Past Surgical History:  Procedure Laterality Date  . none      FAMILY  HISTORY: Family History  Problem Relation Age of Onset  . Heart Problems Mother   . Heart Problems Father     SOCIAL HISTORY:  Social History   Socioeconomic History  . Marital status: Widowed    Spouse name: Not on file  . Number of children: 2  . Years of education: 12th  . Highest education level: Not on file  Occupational History    Comment: Retired  Scientific laboratory technician  . Financial resource strain: Not on file  . Food insecurity:    Worry: Not on file    Inability: Not on file  . Transportation needs:    Medical: Not on file    Non-medical: Not on file  Tobacco Use  . Smoking status: Never Smoker  . Smokeless tobacco: Never Used  Substance and Sexual Activity  . Alcohol use: No    Alcohol/week: 0.0 oz  . Drug use: No  . Sexual activity: Not on file  Lifestyle  . Physical activity:    Days per week: Not on file    Minutes per session: Not on file  . Stress: Not on file  Relationships  . Social connections:    Talks on phone: Not on file    Gets together: Not on file    Attends religious service: Not on file    Active member of club or organization:  Not on file    Attends meetings of clubs or organizations: Not on file    Relationship status: Not on file  . Intimate partner violence:    Fear of current or ex partner: Not on file    Emotionally abused: Not on file    Physically abused: Not on file    Forced sexual activity: Not on file  Other Topics Concern  . Not on file  Social History Narrative   Patient lives at home alone and she is widowed. (Retired)   Education- 12 th grade   Right handed.   Caffeine- two cups of coffee daily.     PHYSICAL EXAM   Vitals:   09/30/17 1454  BP: (!) 156/69  Pulse: 69  Weight: 100 lb (45.4 kg)  Height: 4\' 11"  (1.499 m)    Body mass index is 20.2 kg/m.   PHYSICAL EXAMNIATION:  Gen: NAD, conversant, well nourised, obese, well groomed                     Cardiovascular: Regular rate rhythm, no peripheral  edema, warm, nontender. Eyes: Conjunctivae clear without exudates or hemorrhage Neck: Supple, no carotid bruise. Pulmonary: Clear to auscultation bilaterally   NEUROLOGICAL EXAM:  MENTAL STATUS: Speech:    Speech is normal; fluent and spontaneous with normal comprehension.  Cognition:     Orientation to time, place and person     Normal recent and remote memory     Normal Attention span and concentration     Normal Language, naming, repeating,spontaneous speech     Fund of knowledge   CRANIAL NERVES: CN II: Visual fields are full to confrontation. Fundoscopic exam is normal with sharp discs and no vascular changes. Pupils are round equal and briskly reactive to light. CN III, IV, VI: extraocular movement are normal. No ptosis. CN V: Facial sensation is intact to pinprick in all 3 divisions bilaterally. Corneal responses are intact.  CN VII: Face is symmetric with normal eye closure and smile. CN VIII: Hearing is normal to rubbing fingers CN IX, X: Palate elevates symmetrically. Phonation is normal. CN XI: Head turning and shoulder shrug are intact CN XII: Tongue is midline with normal movements and no atrophy.  MOTOR: She has mild bilateral hip flexion knee flexion ankle dorsiflexion weakness, moderate toe extension weakness, right worse than left  REFLEXES: Reflexes are 2+ and symmetric at the biceps, triceps, absent at knees, and ankles. Plantar responses are flexor.  SENSORY: Length dependent decreased to light touch pinprick to midshin level, Absent toe vibratory sensation, decreased toe proprioception   COORDINATION: Rapid alternating movements and fine finger movements are intact. There is no dysmetria on finger-to-nose and heel-knee-shin.    GAIT/STANCE: She needs to push up to get up from seated position, unsteady wide-based, was not able to stand on tiptoe heels, could not perform tandem walking    DIAGNOSTIC DATA (LABS, IMAGING, TESTING) - I reviewed patient  records, labs, notes, testing and imaging myself where available.  ASSESSMENT AND PLAN  Complex partial seizure  Continue Zonegran 100 mg twice a day  Gait abnormality  Multifactorial, aging, deconditioning, lumbar radiculopathy,  Bilateral lower extremity feet neuropathic pain  Most consistent with mild bilateral lumbar sacral radiculopathy, no evidence of large fiber peripheral neuropathy gabapentin 100 mg titrating to 200 mg twice a day    Marcial Pacas, M.D. Ph.D.  [facility] [address] [place], [place] [address] [phone]  CC: Lajean Manes, MD

## 2017-10-01 ENCOUNTER — Encounter: Payer: Self-pay | Admitting: Neurology

## 2017-10-01 DIAGNOSIS — Z85828 Personal history of other malignant neoplasm of skin: Secondary | ICD-10-CM | POA: Diagnosis not present

## 2017-10-01 DIAGNOSIS — L738 Other specified follicular disorders: Secondary | ICD-10-CM | POA: Diagnosis not present

## 2017-10-01 DIAGNOSIS — D485 Neoplasm of uncertain behavior of skin: Secondary | ICD-10-CM | POA: Diagnosis not present

## 2017-10-01 DIAGNOSIS — I788 Other diseases of capillaries: Secondary | ICD-10-CM | POA: Diagnosis not present

## 2017-10-01 DIAGNOSIS — L821 Other seborrheic keratosis: Secondary | ICD-10-CM | POA: Diagnosis not present

## 2017-10-01 DIAGNOSIS — L820 Inflamed seborrheic keratosis: Secondary | ICD-10-CM | POA: Diagnosis not present

## 2017-10-25 ENCOUNTER — Telehealth: Payer: Self-pay | Admitting: Neurology

## 2017-10-25 NOTE — Telephone Encounter (Signed)
Spoke to patient - she was concerned about taking the fully prescribed dose of gabapentin.  She is aware that she can start as low as 100mg  at bedtime to see how she responds to the medication and slowly titrate up from there.  She verbalized understanding and will call back if she has other questions.

## 2017-10-25 NOTE — Telephone Encounter (Signed)
Pt requesting a call stating she has a few questions regarding gabapentin (NEURONTIN) 100 MG capsule. Please call to advise

## 2017-10-27 DIAGNOSIS — N390 Urinary tract infection, site not specified: Secondary | ICD-10-CM | POA: Diagnosis not present

## 2017-11-10 DIAGNOSIS — Z79899 Other long term (current) drug therapy: Secondary | ICD-10-CM | POA: Diagnosis not present

## 2017-11-10 DIAGNOSIS — I10 Essential (primary) hypertension: Secondary | ICD-10-CM | POA: Diagnosis not present

## 2017-11-10 DIAGNOSIS — R5383 Other fatigue: Secondary | ICD-10-CM | POA: Diagnosis not present

## 2017-11-10 DIAGNOSIS — M7712 Lateral epicondylitis, left elbow: Secondary | ICD-10-CM | POA: Diagnosis not present

## 2017-11-10 DIAGNOSIS — M25562 Pain in left knee: Secondary | ICD-10-CM | POA: Diagnosis not present

## 2017-12-23 DIAGNOSIS — H35373 Puckering of macula, bilateral: Secondary | ICD-10-CM | POA: Diagnosis not present

## 2017-12-23 DIAGNOSIS — H10413 Chronic giant papillary conjunctivitis, bilateral: Secondary | ICD-10-CM | POA: Diagnosis not present

## 2017-12-23 DIAGNOSIS — Z961 Presence of intraocular lens: Secondary | ICD-10-CM | POA: Diagnosis not present

## 2017-12-23 DIAGNOSIS — H40013 Open angle with borderline findings, low risk, bilateral: Secondary | ICD-10-CM | POA: Diagnosis not present

## 2017-12-23 DIAGNOSIS — H1851 Endothelial corneal dystrophy: Secondary | ICD-10-CM | POA: Diagnosis not present

## 2017-12-23 DIAGNOSIS — H04123 Dry eye syndrome of bilateral lacrimal glands: Secondary | ICD-10-CM | POA: Diagnosis not present

## 2018-01-26 ENCOUNTER — Other Ambulatory Visit: Payer: Self-pay | Admitting: Geriatric Medicine

## 2018-01-26 DIAGNOSIS — G629 Polyneuropathy, unspecified: Secondary | ICD-10-CM | POA: Diagnosis not present

## 2018-01-26 DIAGNOSIS — J849 Interstitial pulmonary disease, unspecified: Secondary | ICD-10-CM

## 2018-01-26 DIAGNOSIS — Z0000 Encounter for general adult medical examination without abnormal findings: Secondary | ICD-10-CM | POA: Diagnosis not present

## 2018-01-26 DIAGNOSIS — Z23 Encounter for immunization: Secondary | ICD-10-CM | POA: Diagnosis not present

## 2018-01-26 DIAGNOSIS — R350 Frequency of micturition: Secondary | ICD-10-CM | POA: Diagnosis not present

## 2018-01-26 DIAGNOSIS — Z1389 Encounter for screening for other disorder: Secondary | ICD-10-CM | POA: Diagnosis not present

## 2018-01-26 DIAGNOSIS — I10 Essential (primary) hypertension: Secondary | ICD-10-CM | POA: Diagnosis not present

## 2018-01-26 DIAGNOSIS — E7800 Pure hypercholesterolemia, unspecified: Secondary | ICD-10-CM | POA: Diagnosis not present

## 2018-01-26 DIAGNOSIS — L719 Rosacea, unspecified: Secondary | ICD-10-CM | POA: Diagnosis not present

## 2018-01-26 DIAGNOSIS — Z79899 Other long term (current) drug therapy: Secondary | ICD-10-CM | POA: Diagnosis not present

## 2018-02-01 ENCOUNTER — Ambulatory Visit
Admission: RE | Admit: 2018-02-01 | Discharge: 2018-02-01 | Disposition: A | Discharge status: Home / Self Care | Payer: Medicare HMO | Source: Ambulatory Visit | Attending: Geriatric Medicine | Admitting: Geriatric Medicine

## 2018-02-01 DIAGNOSIS — R911 Solitary pulmonary nodule: Secondary | ICD-10-CM | POA: Diagnosis not present

## 2018-02-01 DIAGNOSIS — J849 Interstitial pulmonary disease, unspecified: Secondary | ICD-10-CM

## 2018-02-03 ENCOUNTER — Other Ambulatory Visit (HOSPITAL_COMMUNITY): Payer: Self-pay | Admitting: Geriatric Medicine

## 2018-02-03 DIAGNOSIS — J8410 Pulmonary fibrosis, unspecified: Secondary | ICD-10-CM | POA: Diagnosis not present

## 2018-02-03 DIAGNOSIS — R911 Solitary pulmonary nodule: Secondary | ICD-10-CM

## 2018-02-07 ENCOUNTER — Encounter (HOSPITAL_COMMUNITY): Payer: Medicare HMO

## 2018-02-15 ENCOUNTER — Ambulatory Visit (HOSPITAL_COMMUNITY)
Admission: RE | Admit: 2018-02-15 | Discharge: 2018-02-15 | Disposition: A | Discharge status: Home / Self Care | Payer: Medicare HMO | Source: Ambulatory Visit | Attending: Geriatric Medicine | Admitting: Geriatric Medicine

## 2018-02-15 ENCOUNTER — Encounter (HOSPITAL_COMMUNITY): Payer: Medicare HMO

## 2018-02-15 DIAGNOSIS — R911 Solitary pulmonary nodule: Secondary | ICD-10-CM | POA: Insufficient documentation

## 2018-02-15 LAB — GLUCOSE, CAPILLARY: GLUCOSE-CAPILLARY: 83 mg/dL (ref 70–99)

## 2018-02-15 MED ORDER — FLUDEOXYGLUCOSE F - 18 (FDG) INJECTION
5.0200 | Freq: Once | INTRAVENOUS | Status: AC | PRN
  Administered 2018-02-15: 5.02 via INTRAVENOUS

## 2018-03-29 DIAGNOSIS — S069X9A Unspecified intracranial injury with loss of consciousness of unspecified duration, initial encounter: Secondary | ICD-10-CM | POA: Insufficient documentation

## 2018-03-29 DIAGNOSIS — S069XAA Unspecified intracranial injury with loss of consciousness status unknown, initial encounter: Secondary | ICD-10-CM | POA: Insufficient documentation

## 2018-03-29 DIAGNOSIS — S0101XA Laceration without foreign body of scalp, initial encounter: Secondary | ICD-10-CM | POA: Diagnosis not present

## 2018-03-29 DIAGNOSIS — S069X0A Unspecified intracranial injury without loss of consciousness, initial encounter: Secondary | ICD-10-CM | POA: Diagnosis not present

## 2018-04-07 DIAGNOSIS — S0001XA Abrasion of scalp, initial encounter: Secondary | ICD-10-CM | POA: Diagnosis not present

## 2018-04-07 DIAGNOSIS — I700 Atherosclerosis of aorta: Secondary | ICD-10-CM | POA: Diagnosis not present

## 2018-04-07 DIAGNOSIS — R42 Dizziness and giddiness: Secondary | ICD-10-CM | POA: Diagnosis not present

## 2018-04-07 DIAGNOSIS — R911 Solitary pulmonary nodule: Secondary | ICD-10-CM | POA: Diagnosis not present

## 2018-04-07 DIAGNOSIS — I10 Essential (primary) hypertension: Secondary | ICD-10-CM | POA: Diagnosis not present

## 2018-04-12 ENCOUNTER — Telehealth: Payer: Self-pay | Admitting: Neurology

## 2018-04-12 NOTE — Telephone Encounter (Signed)
Returned call to the patient.  She reports the neuropathy in her bilateral feet is unchanged and the symptoms are causing her difficulty driving.  She never started gabapentin because she was concerned about the potential side effects.  She wanted more information about a cream she read about in the paper but she did not remember the name of it.  She is going to try to locate the article and bring it to her follow up on 04/14/2018.

## 2018-04-12 NOTE — Telephone Encounter (Signed)
Pt requesting a call to discuss upcoming appt on 1/9//20- did not wish to discuss further

## 2018-04-14 ENCOUNTER — Ambulatory Visit: Payer: Medicare HMO | Admitting: Neurology

## 2018-04-14 ENCOUNTER — Encounter: Payer: Self-pay | Admitting: Neurology

## 2018-04-14 VITALS — BP 162/87 | HR 61 | Ht 59.0 in | Wt 102.0 lb

## 2018-04-14 DIAGNOSIS — G8929 Other chronic pain: Secondary | ICD-10-CM | POA: Diagnosis not present

## 2018-04-14 DIAGNOSIS — M545 Low back pain: Secondary | ICD-10-CM

## 2018-04-14 DIAGNOSIS — M5416 Radiculopathy, lumbar region: Secondary | ICD-10-CM | POA: Diagnosis not present

## 2018-04-14 DIAGNOSIS — R569 Unspecified convulsions: Secondary | ICD-10-CM

## 2018-04-14 MED ORDER — LIDOCAINE-PRILOCAINE 2.5-2.5 % EX CREA: TOPICAL_CREAM | CUTANEOUS | 6 refills | Status: DC

## 2018-04-14 NOTE — Progress Notes (Signed)
Chief Complaint  Patient presents with  . Seizures    She is here with her daughter, Melanie Walker.  She has continued taking Zonegran.  No seizures reported.  . Peripheral Neuropathy    Symptoms unchanged.  She never started the gabapenting due to concerns over the potential side effects.       [facility]  PATIENT: Melanie Walker DOB: 03-03-1931  HISTORICAL Melanie Walker is 85 years old right-handed Caucasian female, came in to followup for her complex partial seizure. last clinical visit was in December 2015.  She had past medical history of complex partial seizure,  previous patient of Dr. Ron Parker, last occurrence was in late 1990s, over the years, she was treated with Vigabatrin for extended period of time, she developed visual field deficit, she was switched to zonisamide 100 mg 2 tablets twice a day, she tolerated the medication very well, there is no recurrent seizures.  Previously she also tried Dilantin, Neurontin, Tegretol, Depakote, Felbatol in the past, she could not tolerate it, or it did not control her seizures well.  For many years, she complains of bilateral feet cold sensation, numbness tingling, had EMG nerve conduction twice that was all normal. There was no evidence of large fiber peripheral neuropathy  Today she complains of few months history of low back pain and shooting along right posterior leg, worsening bilateral feet paresthesia, up to her knee level, mild unsteady gait, she denies bowel and bladder incontinence, she lives alone, independent of living, still driving,  UPDATE Mar 26 2014: She slipped and fell, had left knee fracture in Jan 4th 2015, recovered well after brace, physical therapy. She fell again in April 2015, right ankle drosiflexion, with right foot fracture now improved.   She has no recurrent seizure, tolerating Zonegran 100 mg 2 tablets twice a day   UPDATE Mar 12 2015: She complains of bilateral hand and feet feeling cold, numbness,  she has mild gait difficulty, right ankle, left knee fracture in the past, she has bilateral hand weakness, could not open jars.  She has no incontinence. I have suggested MRI of the lumbar, she is very hesitate to go through the MRI because of claustrophobia, decided not to proceed  UPDATE Nov 8th 2017: She is on lower dose of zonisamide 100 mg in the morning, 200 mg every night since December 2016, no recurrent seizure,  Today she came in with her daughter with complaints of worsening bilateral feet pain, unsteady gait, bilateral hands feel cold, turns blue, was diagnosed with Raynaud's phenomenon. She denies significant low back pain, no bowel bladder incontinence, she does woke up twice every night using bathroom.  Update May 15 2016. She is with her daughter at today's electrodiagnostic study today, which showed no evidence of large fiber peripheral neuropathy, evidence of mild chronic lumbar sacral radiculopathy, she has no evidence of active denervation.  She does has mild bilateral toe extension flexion weakness, mild gait abnormality,  We have personally reviewed MRI of lumbar spine in November 2017, multilevel degenerative disc disease, most severe at L4-5, with moderate canal, moderate bilateral foraminal stenosis, due to disc protrusion, facet joint disease, hypertrophy of ligament flavum,  She has chronic low back pain, but denies significant radiating pain to bilateral lower extremity, denies significant bowel and bladder incontinence,  Today her daughter also concerned about her driving ability, she had one incident of left foot slipped off the pedals, she does have bilateral feet paresthesia, loss of proprioception, she had no recurrent seizure,  tolerating Zonegran 100 mg twice a day  UPDATE September 30 2017: She is accompanied by her daughter at today's clinical visit, she has missed multiple previous appointment, she had no recurrent seizure taking zonisamide 100 mg twice a day,  today her focuses on her bilateral feet discomfort, coldness, hot, burning sensation, mild worsening gait abnormality, she denies significant low back pain, she denies bowel and bladder incontinence.  EMG nerve conduction study in February 2018 showed mild chronic lumbar radiculopathy, mainly involving bilateral L4, 5 S1 nerve roots, well-preserved bilateral superficial peroneal, sural sensory responses, there is no evidence of large fiber sensorimotor polyneuropathy  She continue complains of bilateral feet coldness sensation, mild worsening gait abnormality.  UPDATE Jan 9th 2020: She is accompanied by her daughter Melanie Walker at today's clinical visit, doing well on Zonegran 100 mg twice a day, there was no seizure.  She complains of worsening bilateral lower extremity coldness sensation paresthesia, gait abnormality, gradually progressive over the years, I have given her prescription of gabapentin 100 mg tablets, but she worried about the side effect never tried it, today she complains of 10 out of 10 bilateral feet and lower extremity discomfort  EMG nerve conduction study in February 2018 consistent with chronic bilateral lumbosacral radiculopathy, no evidence of large fiber peripheral neuropathy,  We again personally reviewed MRI of lumbar spine in November 2017, multilevel degenerative changes, most prominent L4-5, spondylolisthetic change with disc degeneration, facet hypertrophy, severe bilateral foraminal narrowing, likely impingement of bilateral L5 nerve roots, L5-S1, moderate bilateral foraminal narrowing,  She has urinary urgency, REVIEW OF SYSTEMS: Full 14 system review of systems performed and notable only for as above   ALLERGIES: Allergies  Allergen Reactions  . Biaxin [Clarithromycin]   . Boniva [Ibandronic Acid] Other (See Comments)    Upset stomach  . Ceftin [Cefuroxime Axetil]   . Dilantin [Phenytoin Sodium Extended]   . Fosamax [Alendronate Sodium] Other (See Comments)     Upset stomach  . Guaifenesin & Derivatives   . Imitrex [Sumatriptan]   . Keflex [Cephalexin]   . Macrobid [Nitrofurantoin Macrocrystal] Hives and Itching  . Montelukast   . Penicillins   . Premarin [Conjugated Estrogens] Itching  . Sulfa Antibiotics   . Hctz [Hydrochlorothiazide] Rash  . Latex Rash    HOME MEDICATIONS: Outpatient Medications Prior to Visit  Medication Sig Dispense Refill  . amLODipine (NORVASC) 2.5 MG tablet Take 5 mg by mouth daily.   7  . Chlorpheniramine Maleate (ALLERGY PO) Take 2 tablets by mouth daily. Equate Allergy    . gabapentin (NEURONTIN) 100 MG capsule Take 2 capsules (200 mg total) by mouth 3 (three) times daily. 180 capsule 11  . Vitamin D, Cholecalciferol, 1000 UNITS CAPS 1,000 Units daily.    Marland Kitchen zonisamide (ZONEGRAN) 100 MG capsule TAKE 1 CAPSULE TWICE DAILY (PLEASE SCHEDULE ANNUAL APPOINTMENT) 180 capsule 4   No facility-administered medications prior to visit.     PAST MEDICAL HISTORY: Past Medical History:  Diagnosis Date  . Seizure ([place])     PAST SURGICAL HISTORY: Past Surgical History:  Procedure Laterality Date  . none      FAMILY HISTORY: Family History  Problem Relation Age of Onset  . Heart Problems Mother   . Heart Problems Father     SOCIAL HISTORY:  Social History   Socioeconomic History  . Marital status: Widowed    Spouse name: Not on file  . Number of children: 2  . Years of education: 12th  . Highest education level: Not  on file  Occupational History    Comment: Retired  Scientific laboratory technician  . Financial resource strain: Not on file  . Food insecurity:    Worry: Not on file    Inability: Not on file  . Transportation needs:    Medical: Not on file    Non-medical: Not on file  Tobacco Use  . Smoking status: Never Smoker  . Smokeless tobacco: Never Used  Substance and Sexual Activity  . Alcohol use: No    Alcohol/week: 0.0 standard drinks  . Drug use: No  . Sexual activity: Not on file  Lifestyle  .  Physical activity:    Days per week: Not on file    Minutes per session: Not on file  . Stress: Not on file  Relationships  . Social connections:    Talks on phone: Not on file    Gets together: Not on file    Attends religious service: Not on file    Active member of club or organization: Not on file    Attends meetings of clubs or organizations: Not on file    Relationship status: Not on file  . Intimate partner violence:    Fear of current or ex partner: Not on file    Emotionally abused: Not on file    Physically abused: Not on file    Forced sexual activity: Not on file  Other Topics Concern  . Not on file  Social History Narrative   Patient lives at home alone and she is widowed. (Retired)   Education- 12 th grade   Right handed.   Caffeine- two cups of coffee daily.     PHYSICAL EXAM   Vitals:   04/14/18 1319  BP: (!) 162/87  Pulse: 61  Weight: 102 lb (46.3 kg)  Height: 4\' 11"  (1.499 m)    Body mass index is 20.6 kg/m.   PHYSICAL EXAMNIATION:  Gen: NAD, conversant, well nourised, obese, well groomed                     Cardiovascular: Regular rate rhythm, no peripheral edema, warm, nontender. Eyes: Conjunctivae clear without exudates or hemorrhage Neck: Supple, no carotid bruise. Pulmonary: Clear to auscultation bilaterally   NEUROLOGICAL EXAM:  MENTAL STATUS: Speech:    Speech is normal; fluent and spontaneous with normal comprehension.  Cognition:     Orientation to time, place and person     Normal recent and remote memory     Normal Attention span and concentration     Normal Language, naming, repeating,spontaneous speech     Fund of knowledge   CRANIAL NERVES: CN II: Visual fields are full to confrontation.  Pupils are round equal and briskly reactive to light. CN III, IV, VI: extraocular movement are normal. No ptosis. CN V: Facial sensation is intact to pinprick in all 3 divisions bilaterally. Corneal responses are intact.  CN VII: Face  is symmetric with normal eye closure and smile. CN VIII: Hearing is normal to rubbing fingers CN IX, X: Palate elevates symmetrically. Phonation is normal. CN XI: Head turning and shoulder shrug are intact CN XII: Tongue is midline with normal movements and no atrophy.  MOTOR: She has mild bilateral hip flexion knee flexion ankle dorsiflexion weakness, moderate toe extension weakness, right worse than left  REFLEXES: Reflexes are 2+ and symmetric at the biceps, triceps, absent at knees, and ankles. Plantar responses are flexor.  SENSORY: Length dependent decreased to light touch pinprick to midshin level, Absent  toe vibratory sensation, decreased toe proprioception   COORDINATION: Rapid alternating movements and fine finger movements are intact. There is no dysmetria on finger-to-nose and heel-knee-shin.    GAIT/STANCE: She needs to push up to get up from seated position, unsteady wide-based, was not able to stand on tiptoe heels, could not perform tandem walking    DIAGNOSTIC DATA (LABS, IMAGING, TESTING) - I reviewed patient records, labs, notes, testing and imaging myself where available.  ASSESSMENT AND PLAN  Complex partial seizure  Continue Zonegran 100 mg twice a day  Gait abnormality  Multifactorial, aging, deconditioning, lumbar radiculopathy, which is confirmed by MRI lumbar spine in November 2017, EMG nerve conduction study in February 2018,  Bilateral lower extremity feet neuropathic pain  Most consistent with mild bilateral lumbar sacral radiculopathy, no evidence of large fiber peripheral neuropathy gabapentin 100 mg titrating to 200 mg twice a day    Marcial Pacas, M.D. Ph.D.  [facility] [address], [place], [place] [address] [phone]  CC: Lajean Manes, MD

## 2018-04-25 ENCOUNTER — Telehealth: Payer: Self-pay | Admitting: Neurology

## 2018-04-25 MED ORDER — ZONISAMIDE 100 MG PO CAPS: ORAL_CAPSULE | ORAL | 3 refills | Status: DC

## 2018-04-25 NOTE — Telephone Encounter (Signed)
Pt states she needs her zonisamide (ZONEGRAN) 100 MG capsule updated with [facility]. Please advise.

## 2018-04-25 NOTE — Telephone Encounter (Signed)
Refills sent to [facility] 

## 2018-05-03 DIAGNOSIS — J849 Interstitial pulmonary disease, unspecified: Secondary | ICD-10-CM | POA: Diagnosis not present

## 2018-05-03 DIAGNOSIS — R0789 Other chest pain: Secondary | ICD-10-CM | POA: Diagnosis not present

## 2018-05-03 DIAGNOSIS — M79671 Pain in right foot: Secondary | ICD-10-CM | POA: Diagnosis not present

## 2018-05-03 DIAGNOSIS — H9201 Otalgia, right ear: Secondary | ICD-10-CM | POA: Diagnosis not present

## 2018-05-03 DIAGNOSIS — I10 Essential (primary) hypertension: Secondary | ICD-10-CM | POA: Diagnosis not present

## 2018-06-08 ENCOUNTER — Other Ambulatory Visit: Payer: Self-pay | Admitting: Geriatric Medicine

## 2018-06-08 DIAGNOSIS — R911 Solitary pulmonary nodule: Secondary | ICD-10-CM

## 2018-06-20 ENCOUNTER — Other Ambulatory Visit: Payer: Medicare HMO

## 2018-07-29 DIAGNOSIS — N390 Urinary tract infection, site not specified: Secondary | ICD-10-CM | POA: Diagnosis not present

## 2018-08-02 ENCOUNTER — Other Ambulatory Visit: Payer: Self-pay

## 2018-08-02 ENCOUNTER — Ambulatory Visit: Payer: Medicare HMO | Admitting: Podiatry

## 2018-08-02 ENCOUNTER — Ambulatory Visit (INDEPENDENT_AMBULATORY_CARE_PROVIDER_SITE_OTHER): Payer: Medicare HMO

## 2018-08-02 ENCOUNTER — Encounter: Payer: Self-pay | Admitting: Podiatry

## 2018-08-02 DIAGNOSIS — M79671 Pain in right foot: Secondary | ICD-10-CM | POA: Diagnosis not present

## 2018-08-02 DIAGNOSIS — G629 Polyneuropathy, unspecified: Secondary | ICD-10-CM | POA: Diagnosis not present

## 2018-08-02 DIAGNOSIS — R2681 Unsteadiness on feet: Secondary | ICD-10-CM | POA: Diagnosis not present

## 2018-08-02 DIAGNOSIS — M79672 Pain in left foot: Secondary | ICD-10-CM

## 2018-08-02 DIAGNOSIS — Q828 Other specified congenital malformations of skin: Secondary | ICD-10-CM | POA: Diagnosis not present

## 2018-08-02 DIAGNOSIS — M779 Enthesopathy, unspecified: Secondary | ICD-10-CM

## 2018-08-02 DIAGNOSIS — M216X9 Other acquired deformities of unspecified foot: Secondary | ICD-10-CM | POA: Diagnosis not present

## 2018-08-02 DIAGNOSIS — M722 Plantar fascial fibromatosis: Secondary | ICD-10-CM | POA: Diagnosis not present

## 2018-08-02 DIAGNOSIS — M25473 Effusion, unspecified ankle: Secondary | ICD-10-CM | POA: Diagnosis not present

## 2018-08-04 NOTE — Progress Notes (Signed)
Subjective:   Patient ID: Melanie Walker, female   DOB: 85 y.o.   MRN: [id number]   HPI 85 year old female presents the office today with her daughter for numerous concerns.  She states that she has tendinitis in her right foot that she is previously been diagnosed by her primary care physician with about 3 to 4 weeks ago.  However her main concern is that she is starting to get burning, numbness to her toes on both feet.  She has seen neurology and they prescribed gabapentin but she is nervous to start this because of side effects.  Her other main concern is that she has a possible wart or callus on the left foot and she points to submetatarsal 1.  She states this area hurts at times and has been waking her up at nighttime.  She is previously tried wart cream which has not been helpful.  She also describes a burning pain in this area.  She states this is been ongoing for several years.  She also gets tenderness and swelling to the ankle on the right side worse than left and again she describes sharp, shooting pain.  She does have a history of fractures to her right ankle in 2015.  She wants to make sure this healed correctly.  She also states that she is been having some difficulty with balance.  No recent falls.   Review of Systems  All other systems reviewed and are negative.  Past Medical History:  Diagnosis Date  . Seizure [facility])     Past Surgical History:  Procedure Laterality Date  . none       Current Outpatient Medications:  .  amLODipine (NORVASC) 2.5 MG tablet, Take 5 mg by mouth daily. , Disp: , Rfl: 7 .  amLODipine (NORVASC) 5 MG tablet, , Disp: , Rfl:  .  Chlorpheniramine Maleate (ALLERGY PO), Take 2 tablets by mouth daily. Equate Allergy, Disp: , Rfl:  .  ciprofloxacin (CIPRO) 250 MG tablet, TK 1 T PO BID FOR 5 DAYS, Disp: , Rfl:  .  gabapentin (NEURONTIN) 100 MG capsule, Take 2 capsules (200 mg total) by mouth 3 (three) times daily., Disp: 180 capsule, Rfl: 11 .   lidocaine-prilocaine (EMLA) cream, Apply to feet as needed., Disp: 30 g, Rfl: 6 .  NON FORMULARY, [facility]  CREAMS-#11 PERIPHERAL NEUROPATHY CREAM, Disp: , Rfl:  .  Vitamin D, Cholecalciferol, 1000 UNITS CAPS, 1,000 Units daily., Disp: , Rfl:  .  zonisamide (ZONEGRAN) 100 MG capsule, TAKE 1 CAPSULE TWICE DAILY., Disp: 180 capsule, Rfl: 3  Allergies  Allergen Reactions  . Biaxin [Clarithromycin]   . Boniva [Ibandronic Acid] Other (See Comments)    Upset stomach  . Ceftin [Cefuroxime Axetil]   . Dilantin [Phenytoin Sodium Extended]   . Fosamax [Alendronate Sodium] Other (See Comments)    Upset stomach  . Guaifenesin & Derivatives   . Imitrex [Sumatriptan]   . Keflex [Cephalexin]   . Macrobid [Nitrofurantoin Macrocrystal] Hives and Itching  . Montelukast   . Penicillins   . Premarin [Conjugated Estrogens] Itching  . Sulfa Antibiotics   . Hctz [Hydrochlorothiazide] Rash  . Latex Rash          Objective:  Physical Exam  General: AAO x3, NAD  Dermatological: Hyperkeratotic tissues present left foot submetatarsal 1.  Upon debridement this is the callus and there is no evidence of underlying ulceration, verruca.  There is no open lesions identified at this time.  Vascular: Dorsalis Pedis artery and  Posterior Tibial artery pedal pulses are 2/4 bilateral with immedate capillary fill time.  There is no pain with calf compression, swelling, warmth, erythema.   Neruologic: Sensation appears to be intact with Thornell Mule monofilament however she is reporting numbness in the toes he also reports burning is waking her up at nighttime.  Musculoskeletal: There is prominence of the metatarsal heads plantarly with atrophy of the fat pad.  This is was causing the hyperkeratotic tissue on the left foot submetatarsal 1 as this is directly along the prominent sesamoids.  There is mild swelling to bilateral ankles.  There is mild tenderness palpation of the plantar aspects of bilateral  heels on insertion of the plantar fascia with the right side worse than the left.  No pain with lateral compression of the calcaneus.  There is no area pinpoint tenderness.  Flexor, extensor tendons appear to be intact.  Achilles tendon appears to be intact.  There is no erythema or warmth associated with the swelling.  Muscular strength 5/5 in all groups tested bilateral.  Gait: Unassisted, Nonantalgic.       Assessment:   85 year old female with neuropathy, hyperkeratotic lesion left foot, tendinitis; instability     Plan:  -Treatment options discussed including all alternatives, risks, and complications -Etiology of symptoms were discussed -X-rays were obtained and reviewed with the patient.  There is no definitive evidence of acute fracture or stress fracture identified bilaterally.  Osteopenia is present. -We long discussion regards to multiple treatment options.  Today I did sharply debride the hyperkeratotic lesion on the left foot without any complications or bleeding.  I dispensed gel metatarsal offloading pads. -Likely heel pain we discussed traction exercises.  Also dispensed gel heel cups. -We discussed changing shoes as well as inserts for her shoes. -We discussed medication options.  She is has not get the gabapentin we discussed slowly titrating up but she still did not want to do this.  Due to this I did order compound cream for neuropathy and also occluded diclofenac as well to help with the tendinitis as well as the neuropathy. -We discussed possible physical therapy.  She does report some gait instability but she has not fallen recently.  We did also discuss today Nurse, mental health.   Trula Slade DPM

## 2018-08-05 ENCOUNTER — Other Ambulatory Visit: Payer: Self-pay | Admitting: Podiatry

## 2018-08-05 DIAGNOSIS — M779 Enthesopathy, unspecified: Secondary | ICD-10-CM

## 2018-09-01 ENCOUNTER — Encounter: Payer: Self-pay | Admitting: Podiatry

## 2018-09-01 ENCOUNTER — Other Ambulatory Visit: Payer: Self-pay

## 2018-09-01 ENCOUNTER — Ambulatory Visit (INDEPENDENT_AMBULATORY_CARE_PROVIDER_SITE_OTHER): Payer: Medicare HMO | Admitting: Podiatry

## 2018-09-01 VITALS — Temp 98.1°F

## 2018-09-01 DIAGNOSIS — M779 Enthesopathy, unspecified: Secondary | ICD-10-CM | POA: Diagnosis not present

## 2018-09-01 DIAGNOSIS — R2681 Unsteadiness on feet: Secondary | ICD-10-CM

## 2018-09-01 DIAGNOSIS — G629 Polyneuropathy, unspecified: Secondary | ICD-10-CM | POA: Diagnosis not present

## 2018-09-01 DIAGNOSIS — M722 Plantar fascial fibromatosis: Secondary | ICD-10-CM

## 2018-09-01 MED ORDER — DICLOFENAC SODIUM 1 % TD GEL: 2.0000 g | Freq: Four times a day (QID) | TRANSDERMAL | 2 refills | Status: DC

## 2018-09-01 MED ORDER — DICLOFENAC SODIUM 1 % TD GEL
2.0000 g | Freq: Four times a day (QID) | TRANSDERMAL | 2 refills | Status: DC
Start: 2018-09-01 — End: 2018-09-01

## 2018-09-04 NOTE — Progress Notes (Signed)
Subjective: 85 year old female presents the office today for follow-up evaluation with her daughter.  She states that overall she still in discomfort the outside aspect of her foot she points on the lateral aspect.  She also still experiencing discomfort with a pins-and-needles to both of her feet.  We did get a compound cream and she states that it helped very minimally.  She has been reluctant to do the oral gabapentin.  She also states that she is off balance and her daughter brought this up today as well.  It seems that she gets off balance and she jerked herself to the side at times.  No recent falls.  She does live at home alone. Denies any systemic complaints such as fevers, chills, nausea, vomiting. No acute changes since last appointment, and no other complaints at this time.   Objective: AAO x3, NAD DP/PT pulses palpable bilaterally, CRT less than 3 seconds There is tenderness along the course the peroneal tendon to the lateral aspect of the foot and ankle overall the peroneal tendon appears to be intact.  Mild tenderness along the course of the plantar fascia on the plantar aspect of the heel insertion of the calcaneus.  Achilles tendon intact without pain.  There is no area pinpoint bony tenderness pain vibratory sensation is no edema, erythema.  No open lesions or pre-ulcerative lesions. No other areas of tendneress today.  No pain with calf compression, swelling, warmth, erythema  Assessment: Tendinitis right foot, neuropathy  Plan: -All treatment options discussed with the patient including all alternatives, risks, complications.  -I long discussion today with her daughter again in regards to treatment options.  From a tendinitis standpoint I prescribed Voltaren gel.  I do think that some of the reason why she is getting tendinitis it is because of the balance issues.  I will refer her to physical therapy for both  the tendinitis but also for gait training.  We will then try to do home  physical therapy.  Also still moving good to assess her safety at home and to help prevent falls.  Her daughter is also asking about shoes.  She is not diabetic but she will check with insurance for shoe coverage. -Tri-Lock ankle brace was dispensed.  Hopefully will give her some stability as well to help prevent her from falling out. -She is hesitant to the gabapentin. After discussion she is willing to try this.  Discussed taking 100 mg at nighttime once a day and see how she tolerates the medication and titrate up based on she can tolerate it as well as relief of symptoms. -Patient encouraged to call the office with any questions, concerns, change in symptoms.   Trula Slade DPM

## 2018-09-06 ENCOUNTER — Telehealth: Payer: Self-pay | Admitting: *Deleted

## 2018-09-06 NOTE — Telephone Encounter (Signed)
Faxed required form, clinicals and demographics to [facility] with note stating pt's family requested [place] [facility], and may discuss self-pay if not covered by insurance.

## 2018-09-06 NOTE — Telephone Encounter (Signed)
-----   Message from Trula Slade, DPM sent at 09/04/2018  9:41 AM EDT ----- Can we refer her to PT for gait instability and right foot tendonitis? Her daughter wants to try for home PT if able. Thanks.

## 2018-09-09 DIAGNOSIS — M5416 Radiculopathy, lumbar region: Secondary | ICD-10-CM | POA: Diagnosis not present

## 2018-09-09 DIAGNOSIS — G629 Polyneuropathy, unspecified: Secondary | ICD-10-CM | POA: Diagnosis not present

## 2018-09-09 DIAGNOSIS — M7671 Peroneal tendinitis, right leg: Secondary | ICD-10-CM | POA: Diagnosis not present

## 2018-09-09 DIAGNOSIS — Z9181 History of falling: Secondary | ICD-10-CM | POA: Diagnosis not present

## 2018-09-09 DIAGNOSIS — M722 Plantar fascial fibromatosis: Secondary | ICD-10-CM | POA: Diagnosis not present

## 2018-09-09 DIAGNOSIS — G40909 Epilepsy, unspecified, not intractable, without status epilepticus: Secondary | ICD-10-CM | POA: Diagnosis not present

## 2018-09-14 DIAGNOSIS — Z9181 History of falling: Secondary | ICD-10-CM | POA: Diagnosis not present

## 2018-09-14 DIAGNOSIS — M722 Plantar fascial fibromatosis: Secondary | ICD-10-CM | POA: Diagnosis not present

## 2018-09-14 DIAGNOSIS — M5416 Radiculopathy, lumbar region: Secondary | ICD-10-CM | POA: Diagnosis not present

## 2018-09-14 DIAGNOSIS — G629 Polyneuropathy, unspecified: Secondary | ICD-10-CM | POA: Diagnosis not present

## 2018-09-14 DIAGNOSIS — M7671 Peroneal tendinitis, right leg: Secondary | ICD-10-CM | POA: Diagnosis not present

## 2018-09-14 DIAGNOSIS — G40909 Epilepsy, unspecified, not intractable, without status epilepticus: Secondary | ICD-10-CM | POA: Diagnosis not present

## 2018-09-15 DIAGNOSIS — G40909 Epilepsy, unspecified, not intractable, without status epilepticus: Secondary | ICD-10-CM | POA: Diagnosis not present

## 2018-09-15 DIAGNOSIS — M5416 Radiculopathy, lumbar region: Secondary | ICD-10-CM | POA: Diagnosis not present

## 2018-09-15 DIAGNOSIS — Z9181 History of falling: Secondary | ICD-10-CM | POA: Diagnosis not present

## 2018-09-15 DIAGNOSIS — M722 Plantar fascial fibromatosis: Secondary | ICD-10-CM | POA: Diagnosis not present

## 2018-09-15 DIAGNOSIS — G629 Polyneuropathy, unspecified: Secondary | ICD-10-CM | POA: Diagnosis not present

## 2018-09-15 DIAGNOSIS — M7671 Peroneal tendinitis, right leg: Secondary | ICD-10-CM | POA: Diagnosis not present

## 2018-09-19 ENCOUNTER — Other Ambulatory Visit: Payer: Self-pay | Admitting: Geriatric Medicine

## 2018-09-19 DIAGNOSIS — Z1231 Encounter for screening mammogram for malignant neoplasm of breast: Secondary | ICD-10-CM

## 2018-09-20 DIAGNOSIS — M722 Plantar fascial fibromatosis: Secondary | ICD-10-CM | POA: Diagnosis not present

## 2018-09-20 DIAGNOSIS — G40909 Epilepsy, unspecified, not intractable, without status epilepticus: Secondary | ICD-10-CM | POA: Diagnosis not present

## 2018-09-20 DIAGNOSIS — Z9181 History of falling: Secondary | ICD-10-CM | POA: Diagnosis not present

## 2018-09-20 DIAGNOSIS — G629 Polyneuropathy, unspecified: Secondary | ICD-10-CM | POA: Diagnosis not present

## 2018-09-20 DIAGNOSIS — M5416 Radiculopathy, lumbar region: Secondary | ICD-10-CM | POA: Diagnosis not present

## 2018-09-20 DIAGNOSIS — M7671 Peroneal tendinitis, right leg: Secondary | ICD-10-CM | POA: Diagnosis not present

## 2018-09-22 DIAGNOSIS — M5416 Radiculopathy, lumbar region: Secondary | ICD-10-CM | POA: Diagnosis not present

## 2018-09-22 DIAGNOSIS — Z9181 History of falling: Secondary | ICD-10-CM | POA: Diagnosis not present

## 2018-09-22 DIAGNOSIS — G629 Polyneuropathy, unspecified: Secondary | ICD-10-CM | POA: Diagnosis not present

## 2018-09-22 DIAGNOSIS — G40909 Epilepsy, unspecified, not intractable, without status epilepticus: Secondary | ICD-10-CM | POA: Diagnosis not present

## 2018-09-22 DIAGNOSIS — M7671 Peroneal tendinitis, right leg: Secondary | ICD-10-CM | POA: Diagnosis not present

## 2018-09-22 DIAGNOSIS — M722 Plantar fascial fibromatosis: Secondary | ICD-10-CM | POA: Diagnosis not present

## 2018-09-27 DIAGNOSIS — M7671 Peroneal tendinitis, right leg: Secondary | ICD-10-CM | POA: Diagnosis not present

## 2018-09-27 DIAGNOSIS — M5416 Radiculopathy, lumbar region: Secondary | ICD-10-CM | POA: Diagnosis not present

## 2018-09-27 DIAGNOSIS — Z9181 History of falling: Secondary | ICD-10-CM | POA: Diagnosis not present

## 2018-09-27 DIAGNOSIS — M722 Plantar fascial fibromatosis: Secondary | ICD-10-CM | POA: Diagnosis not present

## 2018-09-27 DIAGNOSIS — G629 Polyneuropathy, unspecified: Secondary | ICD-10-CM | POA: Diagnosis not present

## 2018-09-27 DIAGNOSIS — G40909 Epilepsy, unspecified, not intractable, without status epilepticus: Secondary | ICD-10-CM | POA: Diagnosis not present

## 2018-10-04 DIAGNOSIS — G40909 Epilepsy, unspecified, not intractable, without status epilepticus: Secondary | ICD-10-CM | POA: Diagnosis not present

## 2018-10-04 DIAGNOSIS — G629 Polyneuropathy, unspecified: Secondary | ICD-10-CM | POA: Diagnosis not present

## 2018-10-04 DIAGNOSIS — M722 Plantar fascial fibromatosis: Secondary | ICD-10-CM | POA: Diagnosis not present

## 2018-10-04 DIAGNOSIS — Z9181 History of falling: Secondary | ICD-10-CM | POA: Diagnosis not present

## 2018-10-04 DIAGNOSIS — M5416 Radiculopathy, lumbar region: Secondary | ICD-10-CM | POA: Diagnosis not present

## 2018-10-04 DIAGNOSIS — M7671 Peroneal tendinitis, right leg: Secondary | ICD-10-CM | POA: Diagnosis not present

## 2018-10-13 ENCOUNTER — Ambulatory Visit: Payer: Medicare HMO | Admitting: Neurology

## 2018-10-17 ENCOUNTER — Telehealth: Payer: Self-pay | Admitting: Podiatry

## 2018-10-17 ENCOUNTER — Ambulatory Visit: Payer: Medicare HMO | Admitting: Podiatry

## 2018-10-17 NOTE — Telephone Encounter (Signed)
Pt left message asking for a call back to discuss returning a brace that she was not able to use.   I called and left message that she can bring it to her appt on 7.20.20 and see what can be done. It was a trilock brace that she was talking about.

## 2018-10-17 NOTE — Telephone Encounter (Signed)
I think if she returns it then we can remove the charge.

## 2018-10-24 ENCOUNTER — Ambulatory Visit: Payer: Medicare HMO | Admitting: Podiatry

## 2018-10-27 DIAGNOSIS — R6889 Other general symptoms and signs: Secondary | ICD-10-CM | POA: Diagnosis not present

## 2018-10-28 ENCOUNTER — Other Ambulatory Visit: Payer: Self-pay

## 2018-10-28 ENCOUNTER — Ambulatory Visit (INDEPENDENT_AMBULATORY_CARE_PROVIDER_SITE_OTHER): Payer: Medicare HMO | Admitting: Podiatry

## 2018-10-28 ENCOUNTER — Encounter: Payer: Self-pay | Admitting: Podiatry

## 2018-10-28 VITALS — Temp 97.9°F

## 2018-10-28 DIAGNOSIS — M779 Enthesopathy, unspecified: Secondary | ICD-10-CM | POA: Diagnosis not present

## 2018-10-28 DIAGNOSIS — R2681 Unsteadiness on feet: Secondary | ICD-10-CM | POA: Diagnosis not present

## 2018-10-28 DIAGNOSIS — G629 Polyneuropathy, unspecified: Secondary | ICD-10-CM

## 2018-11-02 ENCOUNTER — Ambulatory Visit
Admission: RE | Admit: 2018-11-02 | Discharge: 2018-11-02 | Disposition: A | Discharge status: Home / Self Care | Payer: Medicare HMO | Source: Ambulatory Visit | Attending: Geriatric Medicine | Admitting: Geriatric Medicine

## 2018-11-02 ENCOUNTER — Other Ambulatory Visit: Payer: Self-pay

## 2018-11-02 DIAGNOSIS — R6889 Other general symptoms and signs: Secondary | ICD-10-CM | POA: Diagnosis not present

## 2018-11-02 DIAGNOSIS — Z1231 Encounter for screening mammogram for malignant neoplasm of breast: Secondary | ICD-10-CM | POA: Diagnosis not present

## 2018-11-04 DIAGNOSIS — I10 Essential (primary) hypertension: Secondary | ICD-10-CM | POA: Diagnosis not present

## 2018-11-04 DIAGNOSIS — H6122 Impacted cerumen, left ear: Secondary | ICD-10-CM | POA: Diagnosis not present

## 2018-11-04 DIAGNOSIS — L821 Other seborrheic keratosis: Secondary | ICD-10-CM | POA: Diagnosis not present

## 2018-11-04 NOTE — Progress Notes (Signed)
Subjective: 86 year old female presents the office today for follow-up evaluation of tinnitus and also neuropathy.  Her biggest concern is that she cannot drive because she cannot feel her right foot.  She is previously seen neurology for this.  She did try the gabapentin but she did discontinue this due to side effects.  No increase in swelling or redness. Denies any systemic complaints such as fevers, chills, nausea, vomiting. No acute changes since last appointment, and no other complaints at this time.   Objective: AAO x3, NAD-presents with daughter DP/PT pulses palpable bilaterally, CRT less than 3 seconds Majority of her symptoms are numbness to her right leg.  She had previously seen neurology for this.  There is still some discomfort on the course of the peroneal tendons of the flexor tendons but overall tendon appeared intact.  There is no segment edema, erythema.  There is no area pinpoint tenderness. No open lesions or pre-ulcerative lesions.  No pain with calf compression, swelling, warmth, erythema  Assessment: Nerve impingement resulting in right lower extremity numbness; tendinitis  Plan: -All treatment options discussed with the patient including all alternatives, risks, complications.  -I do only think her main cause of the numbness that she is expansion of the right leg.  She is previously seen neurology for this.  Discussed with her possible pain management to see if they can do an injection. -I do want her to continue with therapy as well.  She does have some component of tendinitis but it is because of her balance issues and walking differently.  Trula Slade DPM  -Patient encouraged to call the office with any questions, concerns, change in symptoms.

## 2018-12-08 DIAGNOSIS — H4720 Unspecified optic atrophy: Secondary | ICD-10-CM | POA: Diagnosis not present

## 2018-12-08 DIAGNOSIS — H04123 Dry eye syndrome of bilateral lacrimal glands: Secondary | ICD-10-CM | POA: Diagnosis not present

## 2018-12-08 DIAGNOSIS — Z961 Presence of intraocular lens: Secondary | ICD-10-CM | POA: Diagnosis not present

## 2018-12-08 DIAGNOSIS — H40013 Open angle with borderline findings, low risk, bilateral: Secondary | ICD-10-CM | POA: Diagnosis not present

## 2018-12-08 DIAGNOSIS — H1851 Endothelial corneal dystrophy: Secondary | ICD-10-CM | POA: Diagnosis not present

## 2018-12-08 DIAGNOSIS — R6889 Other general symptoms and signs: Secondary | ICD-10-CM | POA: Diagnosis not present

## 2018-12-08 DIAGNOSIS — H35373 Puckering of macula, bilateral: Secondary | ICD-10-CM | POA: Diagnosis not present

## 2018-12-08 DIAGNOSIS — H10413 Chronic giant papillary conjunctivitis, bilateral: Secondary | ICD-10-CM | POA: Diagnosis not present

## 2019-01-03 DIAGNOSIS — Z23 Encounter for immunization: Secondary | ICD-10-CM | POA: Diagnosis not present

## 2019-01-03 DIAGNOSIS — F5101 Primary insomnia: Secondary | ICD-10-CM | POA: Diagnosis not present

## 2019-01-03 DIAGNOSIS — R6889 Other general symptoms and signs: Secondary | ICD-10-CM | POA: Diagnosis not present

## 2019-01-03 DIAGNOSIS — I10 Essential (primary) hypertension: Secondary | ICD-10-CM | POA: Diagnosis not present

## 2019-01-03 DIAGNOSIS — R21 Rash and other nonspecific skin eruption: Secondary | ICD-10-CM | POA: Diagnosis not present

## 2019-01-03 DIAGNOSIS — J849 Interstitial pulmonary disease, unspecified: Secondary | ICD-10-CM | POA: Diagnosis not present

## 2019-01-12 ENCOUNTER — Telehealth: Payer: Self-pay | Admitting: *Deleted

## 2019-01-12 NOTE — Telephone Encounter (Signed)
Pt states she saw Dr. Jacqualyn Posey 1 month ago and was advised to get New Balance and another type of shoe. Left message informing pt Dr. Jacqualyn Posey also recommended [facility].

## 2019-02-07 ENCOUNTER — Other Ambulatory Visit: Payer: Self-pay | Admitting: Geriatric Medicine

## 2019-02-07 DIAGNOSIS — R911 Solitary pulmonary nodule: Secondary | ICD-10-CM | POA: Diagnosis not present

## 2019-02-07 DIAGNOSIS — R269 Unspecified abnormalities of gait and mobility: Secondary | ICD-10-CM

## 2019-02-07 DIAGNOSIS — R42 Dizziness and giddiness: Secondary | ICD-10-CM | POA: Diagnosis not present

## 2019-02-07 DIAGNOSIS — I10 Essential (primary) hypertension: Secondary | ICD-10-CM | POA: Diagnosis not present

## 2019-02-07 DIAGNOSIS — Z79899 Other long term (current) drug therapy: Secondary | ICD-10-CM | POA: Diagnosis not present

## 2019-02-23 ENCOUNTER — Ambulatory Visit (INDEPENDENT_AMBULATORY_CARE_PROVIDER_SITE_OTHER): Payer: Medicare HMO | Admitting: Otolaryngology

## 2019-02-24 ENCOUNTER — Ambulatory Visit (INDEPENDENT_AMBULATORY_CARE_PROVIDER_SITE_OTHER): Payer: Medicare HMO | Admitting: Otolaryngology

## 2019-02-27 DIAGNOSIS — R42 Dizziness and giddiness: Secondary | ICD-10-CM | POA: Diagnosis not present

## 2019-02-27 DIAGNOSIS — R269 Unspecified abnormalities of gait and mobility: Secondary | ICD-10-CM | POA: Diagnosis not present

## 2019-03-20 ENCOUNTER — Telehealth: Payer: Self-pay | Admitting: *Deleted

## 2019-03-20 DIAGNOSIS — M79662 Pain in left lower leg: Secondary | ICD-10-CM

## 2019-03-20 DIAGNOSIS — M25473 Effusion, unspecified ankle: Secondary | ICD-10-CM

## 2019-03-20 NOTE — Telephone Encounter (Signed)
Pt states she was seen in office last time for the swelling in the legs and ankle during the day. I offered an appt but pt states it is what she was seen for previously and she had to go off the lisinopril her PCP gave because she is allergic.

## 2019-03-20 NOTE — Telephone Encounter (Signed)
Pt states both her ankles have swollen.

## 2019-03-21 NOTE — Telephone Encounter (Signed)
I called the patient to see how she was doing. She states that she is getting swelling to both legs and it looks like her ankles have "golf balls" on the side. She is getting some discomfort to the front of her legs, on both sides. No redness or warmth. No chest pain, SOB. She does not wear compression stockings. Recommended venous duplex to rule out DVT, although my suspension  is low. Will order compression socks to wear if ultrasound is negative. Watch salt intake and encouraged elevation. Follow up in 4-6 weeks  Val- can you order a venous duplex to r/o DVT. Also, can you see if [facility] does compression stockings. If so can you send them a prescription. If not, please mail to the patient. Can do knee high with 20-41mmHg.

## 2019-03-22 NOTE — Telephone Encounter (Signed)
[facility] states they do not have compression hose, but can check with [facility].

## 2019-03-22 NOTE — Telephone Encounter (Signed)
Mailed letter to pt with rx for 20-54mmHg to pt.

## 2019-03-22 NOTE — Telephone Encounter (Signed)
Faxed orders to [facility] for B/L lower extremities r/o DVT.

## 2019-03-22 NOTE — Telephone Encounter (Signed)
Pt called states she spoke with Dr. Jacqualyn Posey last night about her swelling and he discuss the compression hose and [place], but she doesn't know if she can wear the compression hose and still doesn't know what is causing the swelling.

## 2019-03-22 NOTE — Telephone Encounter (Signed)
Pt called and states she had called Dr. Felipa Eth and he feels the swelling in due to to the Amlodipine and is going to change the medication. Pt states she is still having the nerve pain. I told pt since we had not seen her since 10/2018 she would need to make an appt to be reevaluated and pt states she will have to call again with her dtr's schedule.

## 2019-03-22 NOTE — Telephone Encounter (Signed)
Unable to leave a message pt's phone was busy.

## 2019-03-22 NOTE — Telephone Encounter (Signed)
[facility] states [facility] does a simple measurement for compression hose.

## 2019-03-23 NOTE — Telephone Encounter (Signed)
Yes, I had actually talked to her about the amlodipine. She had recently increased the dose and thought the swelling may be coming from that as well. If she does not want to come into the office I am happy to do a virtual visit with her.

## 2019-04-04 DIAGNOSIS — R911 Solitary pulmonary nodule: Secondary | ICD-10-CM | POA: Diagnosis not present

## 2019-04-04 DIAGNOSIS — I10 Essential (primary) hypertension: Secondary | ICD-10-CM | POA: Diagnosis not present

## 2019-04-04 DIAGNOSIS — R350 Frequency of micturition: Secondary | ICD-10-CM | POA: Diagnosis not present

## 2019-04-04 DIAGNOSIS — J849 Interstitial pulmonary disease, unspecified: Secondary | ICD-10-CM | POA: Diagnosis not present

## 2019-04-04 DIAGNOSIS — I872 Venous insufficiency (chronic) (peripheral): Secondary | ICD-10-CM | POA: Diagnosis not present

## 2019-07-04 ENCOUNTER — Telehealth: Payer: Self-pay | Admitting: *Deleted

## 2019-07-04 NOTE — Telephone Encounter (Signed)
I spoke with pt and she would like to know what can be done for a very sore callous. I told pt that often the hard callous skin would be shaved down, and then a off loading pad would be place around the new tender sensitive skin to decrease irritation to the new sensitive skin. Pt states understanding and asked if she would need a shot and I told her not likely.

## 2019-07-04 NOTE — Telephone Encounter (Signed)
Pt called to check on what can be done for a callous.

## 2019-09-01 DIAGNOSIS — R6889 Other general symptoms and signs: Secondary | ICD-10-CM | POA: Diagnosis not present

## 2019-12-04 DIAGNOSIS — R6889 Other general symptoms and signs: Secondary | ICD-10-CM | POA: Diagnosis not present

## 2019-12-14 DIAGNOSIS — Z961 Presence of intraocular lens: Secondary | ICD-10-CM | POA: Diagnosis not present

## 2019-12-14 DIAGNOSIS — H40013 Open angle with borderline findings, low risk, bilateral: Secondary | ICD-10-CM | POA: Diagnosis not present

## 2019-12-14 DIAGNOSIS — H4720 Unspecified optic atrophy: Secondary | ICD-10-CM | POA: Diagnosis not present

## 2019-12-14 DIAGNOSIS — H35373 Puckering of macula, bilateral: Secondary | ICD-10-CM | POA: Diagnosis not present

## 2019-12-14 DIAGNOSIS — H18513 Endothelial corneal dystrophy, bilateral: Secondary | ICD-10-CM | POA: Diagnosis not present

## 2019-12-14 DIAGNOSIS — H10413 Chronic giant papillary conjunctivitis, bilateral: Secondary | ICD-10-CM | POA: Diagnosis not present

## 2019-12-14 DIAGNOSIS — H04123 Dry eye syndrome of bilateral lacrimal glands: Secondary | ICD-10-CM | POA: Diagnosis not present

## 2020-01-18 DIAGNOSIS — R6889 Other general symptoms and signs: Secondary | ICD-10-CM | POA: Diagnosis not present

## 2020-01-22 ENCOUNTER — Other Ambulatory Visit: Payer: Self-pay | Admitting: Geriatric Medicine

## 2020-01-22 DIAGNOSIS — Z1231 Encounter for screening mammogram for malignant neoplasm of breast: Secondary | ICD-10-CM

## 2020-01-23 ENCOUNTER — Other Ambulatory Visit: Payer: Self-pay

## 2020-01-23 ENCOUNTER — Ambulatory Visit
Admission: RE | Admit: 2020-01-23 | Discharge: 2020-01-23 | Disposition: A | Discharge status: Home / Self Care | Payer: Medicare HMO | Source: Ambulatory Visit | Attending: Geriatric Medicine | Admitting: Geriatric Medicine

## 2020-01-23 DIAGNOSIS — Z1231 Encounter for screening mammogram for malignant neoplasm of breast: Secondary | ICD-10-CM | POA: Diagnosis not present

## 2020-02-19 ENCOUNTER — Other Ambulatory Visit: Payer: Self-pay | Admitting: Geriatric Medicine

## 2020-02-19 DIAGNOSIS — Z79899 Other long term (current) drug therapy: Secondary | ICD-10-CM | POA: Diagnosis not present

## 2020-02-19 DIAGNOSIS — G629 Polyneuropathy, unspecified: Secondary | ICD-10-CM | POA: Diagnosis not present

## 2020-02-19 DIAGNOSIS — R269 Unspecified abnormalities of gait and mobility: Secondary | ICD-10-CM | POA: Diagnosis not present

## 2020-02-19 DIAGNOSIS — L719 Rosacea, unspecified: Secondary | ICD-10-CM | POA: Diagnosis not present

## 2020-02-19 DIAGNOSIS — R911 Solitary pulmonary nodule: Secondary | ICD-10-CM

## 2020-02-19 DIAGNOSIS — Z23 Encounter for immunization: Secondary | ICD-10-CM | POA: Diagnosis not present

## 2020-02-19 DIAGNOSIS — Z0000 Encounter for general adult medical examination without abnormal findings: Secondary | ICD-10-CM | POA: Diagnosis not present

## 2020-02-19 DIAGNOSIS — J8410 Pulmonary fibrosis, unspecified: Secondary | ICD-10-CM | POA: Diagnosis not present

## 2020-02-19 DIAGNOSIS — E7800 Pure hypercholesterolemia, unspecified: Secondary | ICD-10-CM | POA: Diagnosis not present

## 2020-02-19 DIAGNOSIS — R350 Frequency of micturition: Secondary | ICD-10-CM | POA: Diagnosis not present

## 2020-02-19 DIAGNOSIS — Z1389 Encounter for screening for other disorder: Secondary | ICD-10-CM | POA: Diagnosis not present

## 2020-02-19 DIAGNOSIS — I10 Essential (primary) hypertension: Secondary | ICD-10-CM | POA: Diagnosis not present

## 2020-02-19 DIAGNOSIS — I700 Atherosclerosis of aorta: Secondary | ICD-10-CM | POA: Diagnosis not present

## 2020-03-06 DIAGNOSIS — R6889 Other general symptoms and signs: Secondary | ICD-10-CM | POA: Diagnosis not present

## 2020-03-07 ENCOUNTER — Other Ambulatory Visit: Payer: Medicare HMO

## 2020-03-15 ENCOUNTER — Ambulatory Visit
Admission: RE | Admit: 2020-03-15 | Discharge: 2020-03-15 | Disposition: A | Discharge status: Home / Self Care | Payer: Medicare HMO | Source: Ambulatory Visit | Attending: Geriatric Medicine | Admitting: Geriatric Medicine

## 2020-03-15 DIAGNOSIS — I700 Atherosclerosis of aorta: Secondary | ICD-10-CM | POA: Diagnosis not present

## 2020-03-15 DIAGNOSIS — R911 Solitary pulmonary nodule: Secondary | ICD-10-CM

## 2020-03-15 DIAGNOSIS — J984 Other disorders of lung: Secondary | ICD-10-CM | POA: Diagnosis not present

## 2020-03-15 DIAGNOSIS — I2510 Atherosclerotic heart disease of native coronary artery without angina pectoris: Secondary | ICD-10-CM | POA: Diagnosis not present

## 2020-03-15 DIAGNOSIS — J432 Centrilobular emphysema: Secondary | ICD-10-CM | POA: Diagnosis not present

## 2020-05-15 DIAGNOSIS — I10 Essential (primary) hypertension: Secondary | ICD-10-CM | POA: Diagnosis not present

## 2020-05-15 DIAGNOSIS — R269 Unspecified abnormalities of gait and mobility: Secondary | ICD-10-CM | POA: Diagnosis not present

## 2020-05-15 DIAGNOSIS — R918 Other nonspecific abnormal finding of lung field: Secondary | ICD-10-CM | POA: Diagnosis not present

## 2020-05-15 DIAGNOSIS — J849 Interstitial pulmonary disease, unspecified: Secondary | ICD-10-CM | POA: Diagnosis not present

## 2020-05-15 DIAGNOSIS — I700 Atherosclerosis of aorta: Secondary | ICD-10-CM | POA: Diagnosis not present

## 2020-05-15 DIAGNOSIS — R21 Rash and other nonspecific skin eruption: Secondary | ICD-10-CM | POA: Diagnosis not present

## 2020-06-06 ENCOUNTER — Institutional Professional Consult (permissible substitution): Payer: Medicare HMO | Admitting: Emergency Medicine

## 2020-06-10 ENCOUNTER — Ambulatory Visit: Payer: Medicare HMO | Admitting: Pulmonary Disease

## 2020-06-10 ENCOUNTER — Other Ambulatory Visit: Payer: Self-pay

## 2020-06-10 VITALS — BP 124/68 | HR 72 | Ht 59.0 in | Wt 105.2 lb

## 2020-06-10 DIAGNOSIS — J432 Centrilobular emphysema: Secondary | ICD-10-CM

## 2020-06-10 DIAGNOSIS — R911 Solitary pulmonary nodule: Secondary | ICD-10-CM | POA: Diagnosis not present

## 2020-06-10 DIAGNOSIS — Z87891 Personal history of nicotine dependence: Secondary | ICD-10-CM | POA: Diagnosis not present

## 2020-06-10 NOTE — Progress Notes (Signed)
Synopsis: Referred in March 2022 for abnormal CT chest, lung nodule by Lajean Manes, MD  Subjective:   PATIENT ID: Melanie Walker GENDER: female DOB: June 07, 1930, MRN: [id number]  Chief Complaint  Patient presents with  . Consult    SOB with activity/ review of scan     87 year old female, past medical history of seizure.  Patient had a CT scan of the chest completed on 03/16/2020.  This revealed a enlarging spiculated 2.6 x 2.6 cm right lower lobe lung nodule with an adjacent more superior satellite nodule at 1.6 x 1.5 cm with associated moderate centrilobular emphysema concerning for primary bronchogenic carcinoma.  Patient was referred for evaluation of abnormality seen on CT imaging.  Patient was referred by Dr. Felipa Eth on March 2.  OV 06/10/2020: here today to discuss next steps. We reviewed CT imaging with patient as well as patients daughter. Former smoker, 35+ years, quit 15 years ago.    Past Medical History:  Diagnosis Date  . Seizure [facility])      Family History  Problem Relation Age of Onset  . Heart Problems Mother   . Heart Problems Father      Past Surgical History:  Procedure Laterality Date  . none      Social History   Socioeconomic History  . Marital status: Widowed    Spouse name: Not on file  . Number of children: 2  . Years of education: 12th  . Highest education level: Not on file  Occupational History    Comment: Retired  Tobacco Use  . Smoking status: Never Smoker  . Smokeless tobacco: Never Used  Substance and Sexual Activity  . Alcohol use: No    Alcohol/week: 0.0 standard drinks  . Drug use: No  . Sexual activity: Not on file  Other Topics Concern  . Not on file  Social History Narrative   Patient lives at home alone and she is widowed. (Retired)   Education- 12 th grade   Right handed.   Caffeine- two cups of coffee daily.   Social Determinants of Health   Financial Resource Strain: Not on file  Food Insecurity: Not on file   Transportation Needs: Not on file  Physical Activity: Not on file  Stress: Not on file  Social Connections: Not on file  Intimate Partner Violence: Not on file     Allergies  Allergen Reactions  . Biaxin [Clarithromycin]   . Boniva [Ibandronic Acid] Other (See Comments)    Upset stomach  . Ceftin [Cefuroxime Axetil]   . Dilantin [Phenytoin Sodium Extended]   . Fosamax [Alendronate Sodium] Other (See Comments)    Upset stomach  . Guaifenesin & Derivatives   . Imitrex [Sumatriptan]   . Keflex [Cephalexin]   . Macrobid [Nitrofurantoin Macrocrystal] Hives and Itching  . Montelukast   . Penicillins   . Premarin [Conjugated Estrogens] Itching  . Sulfa Antibiotics   . Hctz [Hydrochlorothiazide] Rash  . Latex Rash     Outpatient Medications Prior to Visit  Medication Sig Dispense Refill  . amLODipine (NORVASC) 5 MG tablet     . MELATONIN PO Take by mouth.    . Multiple Vitamins-Minerals (SENTRY SENIOR PO) Take by mouth.    Marland Kitchen amLODipine (NORVASC) 2.5 MG tablet Take 5 mg by mouth daily.  (Patient not taking: Reported on 06/10/2020)  7  . Chlorpheniramine Maleate (ALLERGY PO) Take 2 tablets by mouth daily. Equate Allergy (Patient not taking: Reported on 06/10/2020)    . diclofenac sodium (VOLTAREN)  1 % GEL Apply 2 g topically 4 (four) times daily. Rub into affected area of foot 2 to 4 times daily (Patient not taking: Reported on 06/10/2020) 100 g 2  . lidocaine-prilocaine (EMLA) cream Apply to feet as needed. (Patient not taking: Reported on 06/10/2020) 30 g 6  . zonisamide (ZONEGRAN) 100 MG capsule TAKE 1 CAPSULE TWICE DAILY. (Patient not taking: Reported on 06/10/2020) 180 capsule 3   No facility-administered medications prior to visit.    Review of Systems  Constitutional: Negative for chills, fever, malaise/fatigue and weight loss.  HENT: Negative for hearing loss, sore throat and tinnitus.   Eyes: Negative for blurred vision and double vision.  Respiratory: Negative for cough,  hemoptysis, sputum production, shortness of breath, wheezing and stridor.   Cardiovascular: Negative for chest pain, palpitations, orthopnea, leg swelling and PND.  Gastrointestinal: Negative for abdominal pain, constipation, diarrhea, heartburn, nausea and vomiting.  Genitourinary: Negative for dysuria, hematuria and urgency.  Musculoskeletal: Negative for joint pain and myalgias.  Skin: Negative for itching and rash.  Neurological: Negative for dizziness, tingling, weakness and headaches.  Endo/Heme/Allergies: Negative for environmental allergies. Does not bruise/bleed easily.  Psychiatric/Behavioral: Negative for depression. The patient is not nervous/anxious and does not have insomnia.   All other systems reviewed and are negative.    Objective:  Physical Exam Vitals reviewed.  Constitutional:      General: She is not in acute distress.    Appearance: She is well-developed and well-nourished.  HENT:     Head: Normocephalic and atraumatic.     Mouth/Throat:     Mouth: Oropharynx is clear and moist.  Eyes:     General: No scleral icterus.    Conjunctiva/sclera: Conjunctivae normal.     Pupils: Pupils are equal, round, and reactive to light.  Neck:     Vascular: No JVD.     Trachea: No tracheal deviation.  Cardiovascular:     Rate and Rhythm: Normal rate and regular rhythm.     Pulses: Intact distal pulses.     Heart sounds: Normal heart sounds. No murmur heard.   Pulmonary:     Effort: Pulmonary effort is normal. No tachypnea, accessory muscle usage or respiratory distress.     Breath sounds: Normal breath sounds. No stridor. No wheezing, rhonchi or rales.  Abdominal:     General: Bowel sounds are normal. There is no distension.     Palpations: Abdomen is soft.     Tenderness: There is no abdominal tenderness.  Musculoskeletal:        General: No tenderness or edema.     Cervical back: Neck supple.  Lymphadenopathy:     Cervical: No cervical adenopathy.  Skin:     General: Skin is warm and dry.     Capillary Refill: Capillary refill takes less than 2 seconds.     Findings: No rash.  Neurological:     Mental Status: She is alert and oriented to person, place, and time.  Psychiatric:        Mood and Affect: Mood and affect normal.        Behavior: Behavior normal.      Vitals:   06/10/20 1626  BP: 124/68  Pulse: 72  SpO2: 96%  Weight: 105 lb 3.2 oz (47.7 kg)  Height: 4\' 11"  (1.499 m)   96% on RA BMI Readings from Last 3 Encounters:  06/10/20 21.25 kg/m  04/14/18 20.60 kg/m  09/30/17 20.20 kg/m   Wt Readings from Last 3 Encounters:  06/10/20 105 lb 3.2 oz (47.7 kg)  04/14/18 102 lb (46.3 kg)  09/30/17 100 lb (45.4 kg)     CBC    Component Value Date/Time   WBC 8.4 04/02/2017 1811   RBC 4.70 04/02/2017 1811   HGB 14.3 04/02/2017 1811   HCT 43.4 04/02/2017 1811   PLT 318 04/02/2017 1811   MCV 92.3 04/02/2017 1811   MCH 30.4 04/02/2017 1811   MCHC 32.9 04/02/2017 1811   RDW 13.6 04/02/2017 1811    Chest Imaging: CT chest 03/16/2020 enlarging spiculated 2.6 x 2.6 right lower lobe lung nodule with an adjacent satellite nodule concerning for primary bronchogenic carcinoma with associated centrilobular emphysema. The patient's images have been independently reviewed by me.    Pulmonary Functions Testing Results: No flowsheet data found.  FeNO: na  Pathology: na  Echocardiogram: na  Heart Catheterization: na    Assessment & Plan:     ICD-10-CM   1. Nodule of lower lobe of right lung  R91.1 NM PET Image Initial (PI) Skull Base To Thigh    CT Super D Chest Wo Contrast    NM PET Image Restage (PS) Skull Base to Thigh   also, small satelite ipsilateral RLL nodule  2. Centrilobular emphysema ([place])  J43.2   3. Former smoker  Z87.891     Discussion:  87 yo FM, RLL 2.6cm nodule as well as ipsilateral lobar nodule concerning for lobar metastasis in a former smoker. I suspect this represents a primary bronchogenic  carcinoma.   Plan:  We discussed next best steps with patient and daughter.  They would like to proceed with NM PET imaging first.  I think this is important because the last CT imaging was completed in December  She will also need a Super D CT to plan for tissue bx via navigational bronchoscopy  I have also ordered the CT Super D to complete.   Hopefully we can plan for the procedure to be completed after the PET.  We reviewed images today in the office and discussed biopsy options today in detail.    Current Outpatient Medications:  .  amLODipine (NORVASC) 5 MG tablet, , Disp: , Rfl:  .  MELATONIN PO, Take by mouth., Disp: , Rfl:  .  Multiple Vitamins-Minerals (SENTRY SENIOR PO), Take by mouth., Disp: , Rfl:  .  amLODipine (NORVASC) 2.5 MG tablet, Take 5 mg by mouth daily.  (Patient not taking: Reported on 06/10/2020), Disp: , Rfl: 7 .  Chlorpheniramine Maleate (ALLERGY PO), Take 2 tablets by mouth daily. Equate Allergy (Patient not taking: Reported on 06/10/2020), Disp: , Rfl:  .  diclofenac sodium (VOLTAREN) 1 % GEL, Apply 2 g topically 4 (four) times daily. Rub into affected area of foot 2 to 4 times daily (Patient not taking: Reported on 06/10/2020), Disp: 100 g, Rfl: 2 .  lidocaine-prilocaine (EMLA) cream, Apply to feet as needed. (Patient not taking: Reported on 06/10/2020), Disp: 30 g, Rfl: 6 .  zonisamide (ZONEGRAN) 100 MG capsule, TAKE 1 CAPSULE TWICE DAILY. (Patient not taking: Reported on 06/10/2020), Disp: 180 capsule, Rfl: 3  I spent 62 minutes dedicated to the care of this patient on the date of this encounter to include pre-visit review of records, face-to-face time with the patient discussing conditions above, post visit ordering of testing, clinical documentation with the electronic health record, making appropriate referrals as documented, and communicating necessary findings to members of the patients care team.   Garner Nash, DO [facility]  06/10/2020  5:16 PM

## 2020-06-10 NOTE — Patient Instructions (Addendum)
Thank you for visiting Dr. Valeta Harms at [facility]. Today we recommend the following:  Orders Placed This Encounter  Procedures  . NM PET Image Initial (PI) Skull Base To Thigh  . CT Super D Chest Wo Contrast   PET scan and CT will be scheduled ASAP Once results back we will plan for procedure.  Return in about 3 months (around 09/10/2020) for with APP or Dr. Valeta Harms.    Please do your part to reduce the spread of COVID-19.  '

## 2020-06-12 ENCOUNTER — Emergency Department (HOSPITAL_COMMUNITY): Payer: Medicare HMO

## 2020-06-12 ENCOUNTER — Other Ambulatory Visit: Payer: Self-pay

## 2020-06-12 ENCOUNTER — Encounter (HOSPITAL_COMMUNITY): Payer: Self-pay | Admitting: Emergency Medicine

## 2020-06-12 ENCOUNTER — Emergency Department (HOSPITAL_COMMUNITY)
Admission: EM | Admit: 2020-06-12 | Discharge: 2020-06-12 | Disposition: A | Discharge status: Home / Self Care | Payer: Medicare HMO | Attending: Emergency Medicine | Admitting: Emergency Medicine

## 2020-06-12 DIAGNOSIS — R109 Unspecified abdominal pain: Secondary | ICD-10-CM | POA: Diagnosis not present

## 2020-06-12 DIAGNOSIS — R55 Syncope and collapse: Secondary | ICD-10-CM | POA: Diagnosis not present

## 2020-06-12 DIAGNOSIS — I10 Essential (primary) hypertension: Secondary | ICD-10-CM | POA: Insufficient documentation

## 2020-06-12 DIAGNOSIS — R0902 Hypoxemia: Secondary | ICD-10-CM | POA: Diagnosis not present

## 2020-06-12 DIAGNOSIS — Z91040 Latex allergy status: Secondary | ICD-10-CM | POA: Diagnosis not present

## 2020-06-12 DIAGNOSIS — N390 Urinary tract infection, site not specified: Secondary | ICD-10-CM | POA: Diagnosis not present

## 2020-06-12 DIAGNOSIS — T887XXA Unspecified adverse effect of drug or medicament, initial encounter: Secondary | ICD-10-CM | POA: Diagnosis not present

## 2020-06-12 DIAGNOSIS — R0602 Shortness of breath: Secondary | ICD-10-CM | POA: Diagnosis not present

## 2020-06-12 DIAGNOSIS — Z79899 Other long term (current) drug therapy: Secondary | ICD-10-CM | POA: Diagnosis not present

## 2020-06-12 DIAGNOSIS — I491 Atrial premature depolarization: Secondary | ICD-10-CM | POA: Diagnosis not present

## 2020-06-12 DIAGNOSIS — I959 Hypotension, unspecified: Secondary | ICD-10-CM | POA: Diagnosis not present

## 2020-06-12 DIAGNOSIS — R531 Weakness: Secondary | ICD-10-CM | POA: Insufficient documentation

## 2020-06-12 LAB — URINALYSIS, ROUTINE W REFLEX MICROSCOPIC
Bilirubin Urine: NEGATIVE
Glucose, UA: NEGATIVE mg/dL
Hgb urine dipstick: NEGATIVE
Ketones, ur: 20 mg/dL — AB
Nitrite: NEGATIVE
Protein, ur: NEGATIVE mg/dL
Specific Gravity, Urine: 1.035 — ABNORMAL HIGH (ref 1.005–1.030)
pH: 6 (ref 5.0–8.0)

## 2020-06-12 LAB — COMPREHENSIVE METABOLIC PANEL
ALT: 21 U/L (ref 0–44)
AST: 34 U/L (ref 15–41)
Albumin: 3.4 g/dL — ABNORMAL LOW (ref 3.5–5.0)
Alkaline Phosphatase: 57 U/L (ref 38–126)
Anion gap: 10 (ref 5–15)
BUN: 18 mg/dL (ref 8–23)
CO2: 24 mmol/L (ref 22–32)
Calcium: 8.3 mg/dL — ABNORMAL LOW (ref 8.9–10.3)
Chloride: 105 mmol/L (ref 98–111)
Creatinine, Ser: 0.71 mg/dL (ref 0.44–1.00)
GFR, Estimated: >60 mL/min (ref >60)
Glucose, Bld: 89 mg/dL (ref 70–99)
Potassium: 3.9 mmol/L (ref 3.5–5.1)
Sodium: 139 mmol/L (ref 135–145)
Total Bilirubin: 0.8 mg/dL (ref 0.3–1.2)
Total Protein: 6.2 g/dL — ABNORMAL LOW (ref 6.5–8.1)

## 2020-06-12 LAB — CBC WITH DIFFERENTIAL/PLATELET
Abs Immature Granulocytes: 0.02 10*3/uL (ref 0.00–0.07)
Basophils Absolute: 0 10*3/uL (ref 0.0–0.1)
Basophils Relative: 0 %
Eosinophils Absolute: 0.1 10*3/uL (ref 0.0–0.5)
Eosinophils Relative: 3 %
HCT: 41.5 % (ref 36.0–46.0)
Hemoglobin: 13.2 g/dL (ref 12.0–15.0)
Immature Granulocytes: 1 %
Lymphocytes Relative: 24 %
Lymphs Abs: 0.8 10*3/uL (ref 0.7–4.0)
MCH: 29.9 pg (ref 26.0–34.0)
MCHC: 31.8 g/dL (ref 30.0–36.0)
MCV: 93.9 fL (ref 80.0–100.0)
Monocytes Absolute: 0.5 10*3/uL (ref 0.1–1.0)
Monocytes Relative: 16 %
Neutro Abs: 1.8 10*3/uL (ref 1.7–7.7)
Neutrophils Relative %: 56 %
Platelets: 167 10*3/uL (ref 150–400)
RBC: 4.42 MIL/uL (ref 3.87–5.11)
RDW: 14.2 % (ref 11.5–15.5)
WBC: 3.2 10*3/uL — ABNORMAL LOW (ref 4.0–10.5)
nRBC: 0 % (ref 0.0–0.2)

## 2020-06-12 LAB — CBG MONITORING, ED: Glucose-Capillary: 83 mg/dL (ref 70–99)

## 2020-06-12 LAB — TROPONIN I (HIGH SENSITIVITY)
Troponin I (High Sensitivity): 12 ng/L (ref <18)
Troponin I (High Sensitivity): 10 ng/L (ref <18)

## 2020-06-12 MED ORDER — AMLODIPINE BESYLATE 5 MG PO TABS
5.0000 mg | ORAL_TABLET | Freq: Once | ORAL | Status: AC
  Administered 2020-06-12: 5 mg via ORAL
  Filled 2020-06-12: qty 1

## 2020-06-12 MED ORDER — CIPROFLOXACIN HCL 500 MG PO TABS: 500.0000 mg | ORAL_TABLET | Freq: Two times a day (BID) | ORAL | 0 refills | Status: DC

## 2020-06-12 MED ORDER — IOHEXOL 350 MG/ML SOLN
100.0000 mL | Freq: Once | INTRAVENOUS | Status: AC | PRN
  Administered 2020-06-12: 70 mL via INTRAVENOUS

## 2020-06-12 MED ORDER — CETIRIZINE HCL 10 MG PO TABS: 10.0000 mg | ORAL_TABLET | Freq: Every day | ORAL | 1 refills | Status: DC

## 2020-06-12 NOTE — ED Triage Notes (Signed)
BIBA Per EMS: Pt coming from home with complaints of weakness that began on Sunday. Pt experienced a worsening episode of weakness today while using the bathroom; daughter was there to help lower pt to the floor; pt does live alone. 18G L forearm; all vitals WDL:  70HR  112/82 BP 94% RA- recent dx lung CA 118 CBG

## 2020-06-12 NOTE — Discharge Instructions (Addendum)
Sure you are eating and drinking plenty of fluids.  Stop hydroxyzine which may be causing your symptoms of fatigue and nearly passing out.

## 2020-06-12 NOTE — ED Provider Notes (Signed)
Melanie Walker DEPT Provider Note   CSN: [id number] Arrival date & time: 06/12/20  1121     History Chief Complaint  Patient presents with  . Weakness  . Near Syncope    Melanie Walker is a 87 y.o. female.  Patient is an 87 year old female with a history of abnormal gait, hypertension,, complex partial seizures who is presenting today for a near syncopal events.  Patient reports on Sunday she was walking out of the bathroom with her walker and had no preceding symptoms and woke up on the floor.  She reports she was able to finally stand and had been okay but just generally sore until today her daughter was there and she came out of the bathroom and her daughter reported she just stopped and started staring in the distance and was not responding.  Daughter helped her to the floor and she reports that she came around quickly after being lowered to the floor.  Patient denies any chest pain or shortness of breath.  She has had no abdominal pain but does report urinary frequency which has been present for quite some time.  She has no dysuria, fever, nausea or vomiting.  Daughter reports she was recently started on hydroxyzine due to ongoing itching but she has been on that for several weeks.  Patient has not taken any of her blood pressure medication this morning.  She was noted to have a blood pressure of 112/82 when EMS arrived.  Daughter reports they just saw her PCP a few days ago and there was concern for possible lung cancer and she has a CT scheduled for further evaluation.  Patient does not have prior history of syncope.   Weakness Associated symptoms: near-syncope   Near Syncope       Past Medical History:  Diagnosis Date  . Seizure [facility])     Patient Active Problem List   Diagnosis Date Noted  . Mild traumatic brain injury ([place]) 03/29/2018  . Lumbar radiculopathy 09/30/2017  . Paresthesia 02/12/2016  . Abnormality of gait 03/12/2015  . Risk for  falls 01/25/2015  . HTN (hypertension) 01/25/2015  . Seizure ([place])   . Complex partial seizure ([place]) 03/08/2013  . Numbness 03/08/2013  . Low back pain 03/08/2013    Past Surgical History:  Procedure Laterality Date  . none       OB History   No obstetric history on file.     Family History  Problem Relation Age of Onset  . Heart Problems Mother   . Heart Problems Father     Social History   Tobacco Use  . Smoking status: Never Smoker  . Smokeless tobacco: Never Used  Substance Use Topics  . Alcohol use: No    Alcohol/week: 0.0 standard drinks  . Drug use: No    Home Medications Prior to Admission medications   Medication Sig Start Date End Date Taking? Authorizing Provider  amLODipine (NORVASC) 5 MG tablet Take 5 mg by mouth daily. 06/20/18  Yes [provider]  hydrOXYzine (ATARAX/VISTARIL) 10 MG tablet Take 10 mg by mouth 3 (three) times daily. 06/05/20  Yes [provider]  MELATONIN PO Take by mouth at bedtime as needed (sleep).   Yes [provider]  Multiple Vitamins-Minerals (SENTRY SENIOR PO) Take 1 tablet by mouth daily.   Yes [provider]  diclofenac sodium (VOLTAREN) 1 % GEL Apply 2 g topically 4 (four) times daily. Rub into affected area of foot 2 to 4  times daily Patient not taking: No sig reported 09/01/18   Trula Slade, DPM  lidocaine-prilocaine (EMLA) cream Apply to feet as needed. Patient not taking: No sig reported 04/14/18   Marcial Pacas, MD  zonisamide (ZONEGRAN) 100 MG capsule TAKE 1 CAPSULE TWICE DAILY. Patient not taking: No sig reported 04/25/18   Marcial Pacas, MD    Allergies    Biaxin [clarithromycin], Boniva [ibandronic acid], Ceftin [cefuroxime axetil], Dilantin [phenytoin sodium extended], Fosamax [alendronate sodium], Guaifenesin & derivatives, Imitrex [sumatriptan], Keflex [cephalexin], Macrobid [nitrofurantoin macrocrystal], Montelukast, Penicillins, Premarin [conjugated estrogens], Sulfa  antibiotics, Hctz [hydrochlorothiazide], and Latex  Review of Systems   Review of Systems  Cardiovascular: Positive for near-syncope.  Neurological: Positive for weakness.  All other systems reviewed and are negative.   Physical Exam Updated Vital Signs BP (!) 162/75   Pulse 69   Temp (!) 97.5 F (36.4 C) (Rectal)   Resp 19   Ht 4\' 11"  (1.499 m)   Wt 47.8 kg   SpO2 93%   BMI 21.28 kg/m   Physical Exam Vitals and nursing note reviewed.  Constitutional:      General: She is not in acute distress.    Appearance: Normal appearance. She is well-developed, normal weight and well-nourished.  HENT:     Head: Normocephalic and atraumatic.     Mouth/Throat:     Mouth: Mucous membranes are moist.  Eyes:     Extraocular Movements: Extraocular movements intact and EOM normal.     Conjunctiva/sclera: Conjunctivae normal.     Pupils: Pupils are equal, round, and reactive to light.  Cardiovascular:     Rate and Rhythm: Normal rate and regular rhythm.     Pulses: Normal pulses and intact distal pulses.     Heart sounds: Normal heart sounds. No murmur heard. No friction rub.  Pulmonary:     Effort: Pulmonary effort is normal.     Breath sounds: Normal breath sounds. No wheezing or rales.  Abdominal:     General: Bowel sounds are normal. There is no distension.     Palpations: Abdomen is soft.     Tenderness: There is abdominal tenderness in the suprapubic area. There is no guarding or rebound.  Musculoskeletal:        General: No tenderness. Normal range of motion.     Right lower leg: No edema.     Left lower leg: No edema.     Comments: No edema  Skin:    General: Skin is warm and dry.     Findings: No rash.  Neurological:     General: No focal deficit present.     Mental Status: She is alert and oriented to person, place, and time. Mental status is at baseline.     Cranial Nerves: No cranial nerve deficit.     Sensory: No sensory deficit.     Motor: No weakness.   Psychiatric:        Mood and Affect: Mood and affect and mood normal.        Behavior: Behavior normal.        Thought Content: Thought content normal.     ED Results / Procedures / Treatments   Labs (all labs ordered are listed, but only abnormal results are displayed) Labs Reviewed  CBC WITH DIFFERENTIAL/PLATELET - Abnormal; Notable for the following components:      Result Value   WBC 3.2 (*)    All other components within normal limits  COMPREHENSIVE METABOLIC PANEL - Abnormal; Notable  for the following components:   Calcium 8.3 (*)    Total Protein 6.2 (*)    Albumin 3.4 (*)    All other components within normal limits  URINE CULTURE  URINALYSIS, ROUTINE W REFLEX MICROSCOPIC  CBG MONITORING, ED  TROPONIN I (HIGH SENSITIVITY)  TROPONIN I (HIGH SENSITIVITY)    EKG EKG Interpretation  Date/Time:  Wednesday June 12 2020 11:38:12 EST Ventricular Rate:  65 PR Interval:    QRS Duration: 133 QT Interval:  385 QTC Calculation: 401 R Axis:   52 Text Interpretation: Sinus rhythm Multiple premature complexes, vent & supraven Right bundle branch block No previous tracing Confirmed by Blanchie Dessert [phone]) on 06/12/2020 1:03:35 PM   Radiology CT Angio Chest PE W and/or Wo Contrast  Result Date: 06/12/2020 CLINICAL DATA:  Weakness and shortness of breath EXAM: CT ANGIOGRAPHY CHEST WITH CONTRAST TECHNIQUE: Multidetector CT imaging of the chest was performed using the standard protocol during bolus administration of intravenous contrast. Multiplanar CT image reconstructions and MIPs were obtained to evaluate the vascular anatomy. CONTRAST:  51mL OMNIPAQUE IOHEXOL 350 MG/ML SOLN COMPARISON:  None. FINDINGS: Cardiovascular: There is a optimal opacification of the pulmonary arteries. There is no central,segmental, or subsegmental filling defects within the pulmonary arteries. The heart is normal in size. No pericardial effusion or thickening. No evidence right heart strain. There is  normal three-vessel brachiocephalic anatomy without proximal stenosis. Aortic atherosclerosis is noted. Coronary artery calcifications are seen. Mediastinum/Nodes: No hilar, mediastinal, or axillary adenopathy. Thyroid gland, trachea, and esophagus demonstrate no significant findings. Centrilobular emphysematous changes seen within both lung apices. There is minimal peripheral patchy airspace opacity seen within the right upper lobe and anterior left upper lobe with honeycombing as on prior exam. There is a rounded ill-defined airspace opacity measuring 3.1 cm at the posterior right lung base. Ground-glass opacities seen at both lung bases. Upper Abdomen: No acute abnormalities present in the visualized portions of the upper abdomen. Musculoskeletal: No chest wall abnormality. No acute or significant osseous findings. Review of the MIP images confirms the above findings. IMPRESSION: 1. No central, segmental, or subsegmental embolism. 2. Rounded airspace consolidation in the posterior right lung base, likely due to pneumonia. However short interval follow-up is recommended in 3-4 weeks following trial of antibiotic therapy to ensure resolution and exclude underlying malignancy. 3.  Emphysema (ICD10-J43.9). 4.  Aortic Atherosclerosis (ICD10-I70.0). Electronically Signed   By: Prudencio Pair M.D.   On: 06/12/2020 15:16    Procedures Procedures   Medications Ordered in ED Medications - No data to display  ED Course  I have reviewed the triage vital signs and the nursing notes.  Pertinent labs & imaging results that were available during my care of the patient were reviewed by me and considered in my medical decision making (see chart for details).    MDM Rules/Calculators/A&P                          Pleasant elderly female presenting today after a near syncopal event at home prior to arrival but a syncopal event 2 days prior.  Patient has complained of just generally being sore and mildly weak but no  other significant complaints.  Patient was recently diagnosed with a spiculated mass concerning for lung cancer that is getting further work-up in the future.  She denies any chest pain or shortness of breath.  Oxygen saturation is consistently in the low 90s but patient is in no acute distress here.  She does have a history of complex partial seizures but daughter witnessed the event today and she had no focal shaking or jerking.  Symptoms improved when she laid down and it sounds more near syncopal than it does seizure.  Patient had no postictal.  And is mentating normally here.  She did recently start hydroxyzine in the last few weeks but unclear if that would be causing her symptoms.  She had no blood pressure medication today and vital signs here have been reassuring. Patient had an EKG today that showed a right bundle branch block with some ectopy without old to compare.  Family requested that she have a CT of the chest since she was going to be getting that anyway.  Given concern for new mass possible cancer and syncope also feel that a PE would be high risk.  She denies any infectious symptoms at this time with lower suspicion for Covid or pneumonia. CBC today with a leukopenia of 3.2 but otherwise normal, CMP within normal limits and troponin is 12.  UA and CT are still pending.  CBG is normal at 83.  3:53 PM CTA negative for PE today.  Still noted the mass in the right lower lung.  Patient is planning on getting a PET scan next week for further evaluation.  She is not displaying any pneumonialike symptoms such as productive cough fever or shortness of breath.  Concerned that the hydroxyzine 3 times daily may be causing some of her symptoms as she has had more weakness, drowsiness in shakiness.  We will have her stop hydroxyzine and start taking Zyrtec instead for the itching.  She could also use Sarna cream as needed.  UA is pending and patient checked out to Dr. Karle Starch  Final Clinical  Impression(s) / ED Diagnoses Final diagnoses:  None    Rx / DC Orders ED Discharge Orders    None       Blanchie Dessert, MD 06/12/20 1557

## 2020-06-12 NOTE — ED Notes (Signed)
Placed pt on purewick  

## 2020-06-12 NOTE — ED Provider Notes (Signed)
Care of the patient assumed at the change of shift pending Urinalysis results.  Physical Exam  BP (!) 157/69   Pulse 79   Temp (!) 97.5 F (36.4 C) (Rectal)   Resp 15   Ht 4\' 11"  (1.499 m)   Wt 47.8 kg   SpO2 91%   BMI 21.28 kg/m   Physical Exam Awake and alert No distress Resp even and unlabored ED Course/Procedures     Procedures  MDM  UA with signs of infection. Daughter states she has responded well to Cipro in the past. Will send for culture as well. Recommend PCP follow up.       Truddie Hidden, MD 06/12/20 [phone]

## 2020-06-14 LAB — URINE CULTURE

## 2020-06-17 ENCOUNTER — Inpatient Hospital Stay: Admission: RE | Admit: 2020-06-17 | Payer: Medicare HMO | Source: Ambulatory Visit

## 2020-06-20 ENCOUNTER — Emergency Department (HOSPITAL_COMMUNITY): Payer: Medicare HMO

## 2020-06-20 ENCOUNTER — Inpatient Hospital Stay (HOSPITAL_COMMUNITY)
Admission: EM | Admit: 2020-06-20 | Discharge: 2020-07-02 | DRG: 177 | Disposition: A | Discharge status: Skilled Nursing Facility | Payer: Medicare HMO | Attending: Internal Medicine | Admitting: Internal Medicine

## 2020-06-20 ENCOUNTER — Encounter (HOSPITAL_COMMUNITY): Payer: Self-pay | Admitting: Emergency Medicine

## 2020-06-20 ENCOUNTER — Other Ambulatory Visit: Payer: Self-pay

## 2020-06-20 DIAGNOSIS — C3491 Malignant neoplasm of unspecified part of right bronchus or lung: Secondary | ICD-10-CM | POA: Diagnosis not present

## 2020-06-20 DIAGNOSIS — R9431 Abnormal electrocardiogram [ECG] [EKG]: Secondary | ICD-10-CM | POA: Diagnosis not present

## 2020-06-20 DIAGNOSIS — M2550 Pain in unspecified joint: Secondary | ICD-10-CM | POA: Diagnosis not present

## 2020-06-20 DIAGNOSIS — M50323 Other cervical disc degeneration at C6-C7 level: Secondary | ICD-10-CM | POA: Diagnosis not present

## 2020-06-20 DIAGNOSIS — W19XXXA Unspecified fall, initial encounter: Secondary | ICD-10-CM | POA: Diagnosis not present

## 2020-06-20 DIAGNOSIS — Z91040 Latex allergy status: Secondary | ICD-10-CM

## 2020-06-20 DIAGNOSIS — J9600 Acute respiratory failure, unspecified whether with hypoxia or hypercapnia: Secondary | ICD-10-CM | POA: Diagnosis present

## 2020-06-20 DIAGNOSIS — R531 Weakness: Secondary | ICD-10-CM | POA: Diagnosis not present

## 2020-06-20 DIAGNOSIS — R498 Other voice and resonance disorders: Secondary | ICD-10-CM | POA: Diagnosis not present

## 2020-06-20 DIAGNOSIS — R1310 Dysphagia, unspecified: Secondary | ICD-10-CM | POA: Diagnosis not present

## 2020-06-20 DIAGNOSIS — J432 Centrilobular emphysema: Secondary | ICD-10-CM | POA: Diagnosis not present

## 2020-06-20 DIAGNOSIS — J90 Pleural effusion, not elsewhere classified: Secondary | ICD-10-CM | POA: Diagnosis not present

## 2020-06-20 DIAGNOSIS — I10 Essential (primary) hypertension: Secondary | ICD-10-CM | POA: Diagnosis present

## 2020-06-20 DIAGNOSIS — J9601 Acute respiratory failure with hypoxia: Secondary | ICD-10-CM | POA: Diagnosis present

## 2020-06-20 DIAGNOSIS — I6782 Cerebral ischemia: Secondary | ICD-10-CM | POA: Diagnosis not present

## 2020-06-20 DIAGNOSIS — U071 COVID-19: Principal | ICD-10-CM | POA: Diagnosis present

## 2020-06-20 DIAGNOSIS — R2689 Other abnormalities of gait and mobility: Secondary | ICD-10-CM | POA: Diagnosis not present

## 2020-06-20 DIAGNOSIS — G319 Degenerative disease of nervous system, unspecified: Secondary | ICD-10-CM | POA: Diagnosis not present

## 2020-06-20 DIAGNOSIS — Z79899 Other long term (current) drug therapy: Secondary | ICD-10-CM | POA: Diagnosis not present

## 2020-06-20 DIAGNOSIS — G40209 Localization-related (focal) (partial) symptomatic epilepsy and epileptic syndromes with complex partial seizures, not intractable, without status epilepticus: Secondary | ICD-10-CM | POA: Diagnosis present

## 2020-06-20 DIAGNOSIS — Z882 Allergy status to sulfonamides status: Secondary | ICD-10-CM

## 2020-06-20 DIAGNOSIS — I959 Hypotension, unspecified: Secondary | ICD-10-CM | POA: Diagnosis not present

## 2020-06-20 DIAGNOSIS — R296 Repeated falls: Secondary | ICD-10-CM | POA: Diagnosis present

## 2020-06-20 DIAGNOSIS — N139 Obstructive and reflux uropathy, unspecified: Secondary | ICD-10-CM | POA: Diagnosis present

## 2020-06-20 DIAGNOSIS — Z888 Allergy status to other drugs, medicaments and biological substances status: Secondary | ICD-10-CM | POA: Diagnosis not present

## 2020-06-20 DIAGNOSIS — G9341 Metabolic encephalopathy: Secondary | ICD-10-CM | POA: Diagnosis present

## 2020-06-20 DIAGNOSIS — R338 Other retention of urine: Secondary | ICD-10-CM | POA: Diagnosis present

## 2020-06-20 DIAGNOSIS — Z881 Allergy status to other antibiotic agents status: Secondary | ICD-10-CM | POA: Diagnosis not present

## 2020-06-20 DIAGNOSIS — Z7401 Bed confinement status: Secondary | ICD-10-CM | POA: Diagnosis not present

## 2020-06-20 DIAGNOSIS — J439 Emphysema, unspecified: Secondary | ICD-10-CM | POA: Diagnosis present

## 2020-06-20 DIAGNOSIS — M50322 Other cervical disc degeneration at C5-C6 level: Secondary | ICD-10-CM | POA: Diagnosis not present

## 2020-06-20 DIAGNOSIS — M4312 Spondylolisthesis, cervical region: Secondary | ICD-10-CM | POA: Diagnosis not present

## 2020-06-20 DIAGNOSIS — C3431 Malignant neoplasm of lower lobe, right bronchus or lung: Secondary | ICD-10-CM | POA: Diagnosis present

## 2020-06-20 DIAGNOSIS — R918 Other nonspecific abnormal finding of lung field: Secondary | ICD-10-CM

## 2020-06-20 DIAGNOSIS — R9082 White matter disease, unspecified: Secondary | ICD-10-CM | POA: Diagnosis not present

## 2020-06-20 DIAGNOSIS — J1282 Pneumonia due to coronavirus disease 2019: Secondary | ICD-10-CM | POA: Diagnosis present

## 2020-06-20 DIAGNOSIS — M6281 Muscle weakness (generalized): Secondary | ICD-10-CM | POA: Diagnosis not present

## 2020-06-20 DIAGNOSIS — Z887 Allergy status to serum and vaccine status: Secondary | ICD-10-CM | POA: Diagnosis not present

## 2020-06-20 DIAGNOSIS — R262 Difficulty in walking, not elsewhere classified: Secondary | ICD-10-CM | POA: Diagnosis not present

## 2020-06-20 DIAGNOSIS — R0902 Hypoxemia: Secondary | ICD-10-CM | POA: Diagnosis not present

## 2020-06-20 DIAGNOSIS — J449 Chronic obstructive pulmonary disease, unspecified: Secondary | ICD-10-CM | POA: Diagnosis not present

## 2020-06-20 DIAGNOSIS — R2681 Unsteadiness on feet: Secondary | ICD-10-CM | POA: Diagnosis not present

## 2020-06-20 DIAGNOSIS — I700 Atherosclerosis of aorta: Secondary | ICD-10-CM | POA: Diagnosis not present

## 2020-06-20 DIAGNOSIS — J984 Other disorders of lung: Secondary | ICD-10-CM | POA: Diagnosis not present

## 2020-06-20 DIAGNOSIS — R0602 Shortness of breath: Secondary | ICD-10-CM | POA: Diagnosis not present

## 2020-06-20 DIAGNOSIS — R41841 Cognitive communication deficit: Secondary | ICD-10-CM | POA: Diagnosis not present

## 2020-06-20 HISTORY — DX: Malignant (primary) neoplasm, unspecified: C80.1

## 2020-06-20 LAB — COMPREHENSIVE METABOLIC PANEL
ALT: 34 U/L (ref 0–44)
AST: 59 U/L — ABNORMAL HIGH (ref 15–41)
Albumin: 3.1 g/dL — ABNORMAL LOW (ref 3.5–5.0)
Alkaline Phosphatase: 87 U/L (ref 38–126)
Anion gap: 9 (ref 5–15)
BUN: 15 mg/dL (ref 8–23)
CO2: 23 mmol/L (ref 22–32)
Calcium: 8.2 mg/dL — ABNORMAL LOW (ref 8.9–10.3)
Chloride: 100 mmol/L (ref 98–111)
Creatinine, Ser: 0.66 mg/dL (ref 0.44–1.00)
GFR, Estimated: >60 mL/min (ref >60)
Glucose, Bld: 88 mg/dL (ref 70–99)
Potassium: 3.8 mmol/L (ref 3.5–5.1)
Sodium: 132 mmol/L — ABNORMAL LOW (ref 135–145)
Total Bilirubin: 1.5 mg/dL — ABNORMAL HIGH (ref 0.3–1.2)
Total Protein: 6.3 g/dL — ABNORMAL LOW (ref 6.5–8.1)

## 2020-06-20 LAB — CBC WITH DIFFERENTIAL/PLATELET
Abs Immature Granulocytes: 0.03 10*3/uL (ref 0.00–0.07)
Basophils Absolute: 0 10*3/uL (ref 0.0–0.1)
Basophils Relative: 1 %
Eosinophils Absolute: 0 10*3/uL (ref 0.0–0.5)
Eosinophils Relative: 1 %
HCT: 45.1 % (ref 36.0–46.0)
Hemoglobin: 14.9 g/dL (ref 12.0–15.0)
Immature Granulocytes: 1 %
Lymphocytes Relative: 21 %
Lymphs Abs: 0.8 10*3/uL (ref 0.7–4.0)
MCH: 29.6 pg (ref 26.0–34.0)
MCHC: 33 g/dL (ref 30.0–36.0)
MCV: 89.7 fL (ref 80.0–100.0)
Monocytes Absolute: 0.3 10*3/uL (ref 0.1–1.0)
Monocytes Relative: 9 %
Neutro Abs: 2.4 10*3/uL (ref 1.7–7.7)
Neutrophils Relative %: 67 %
Platelets: 314 10*3/uL (ref 150–400)
RBC: 5.03 MIL/uL (ref 3.87–5.11)
RDW: 13.8 % (ref 11.5–15.5)
WBC: 3.5 10*3/uL — ABNORMAL LOW (ref 4.0–10.5)
nRBC: 0 % (ref 0.0–0.2)

## 2020-06-20 LAB — LACTIC ACID, PLASMA: Lactic Acid, Venous: 1 mmol/L (ref 0.5–1.9)

## 2020-06-20 LAB — RESP PANEL BY RT-PCR (FLU A&B, COVID) ARPGX2
Influenza A by PCR: NEGATIVE
Influenza B by PCR: NEGATIVE
SARS Coronavirus 2 by RT PCR: POSITIVE — AB

## 2020-06-20 LAB — MAGNESIUM: Magnesium: 2.1 mg/dL (ref 1.7–2.4)

## 2020-06-20 LAB — CBG MONITORING, ED: Glucose-Capillary: 82 mg/dL (ref 70–99)

## 2020-06-20 LAB — PROTIME-INR
INR: 0.9 (ref 0.8–1.2)
Prothrombin Time: 12 seconds (ref 11.4–15.2)

## 2020-06-20 LAB — PROCALCITONIN: Procalcitonin: <0.1 ng/mL

## 2020-06-20 MED ORDER — ONDANSETRON HCL 4 MG/2ML IJ SOLN
4.0000 mg | Freq: Once | INTRAMUSCULAR | Status: DC
  Filled 2020-06-20: qty 2

## 2020-06-20 MED ORDER — SODIUM CHLORIDE 0.9 % IV SOLN
2.0000 g | Freq: Once | INTRAVENOUS | Status: AC
  Administered 2020-06-20: 2 g via INTRAVENOUS
  Filled 2020-06-20: qty 2

## 2020-06-20 MED ORDER — FENTANYL CITRATE (PF) 100 MCG/2ML IJ SOLN
50.0000 ug | Freq: Once | INTRAMUSCULAR | Status: AC
Start: 2020-06-20 — End: 2020-06-21
  Administered 2020-06-21: 50 ug via INTRAVENOUS
  Filled 2020-06-20 (×2): qty 2

## 2020-06-20 MED ORDER — ACETAMINOPHEN 325 MG PO TABS
650.0000 mg | ORAL_TABLET | Freq: Once | ORAL | Status: AC
  Administered 2020-06-20: 650 mg via ORAL
  Filled 2020-06-20: qty 2

## 2020-06-20 MED ORDER — VANCOMYCIN HCL IN DEXTROSE 1-5 GM/200ML-% IV SOLN
1000.0000 mg | Freq: Once | INTRAVENOUS | Status: AC
  Administered 2020-06-20: 1000 mg via INTRAVENOUS
  Filled 2020-06-20: qty 200

## 2020-06-20 MED ORDER — SODIUM CHLORIDE 0.9 % IV SOLN: INTRAVENOUS | Status: DC

## 2020-06-20 NOTE — ED Notes (Addendum)
Placed external female catheter on patient Warm blankets  Recliner placed in patients room for daughter

## 2020-06-20 NOTE — Progress Notes (Signed)
A consult was received from an ED physician for vancomycin per pharmacy dosing.  The patient's profile has been reviewed for ht/wt/allergies/indication/available labs.   A one time order has been placed for vancomycin 1g.  Further antibiotics/pharmacy consults should be ordered by admitting physician if indicated.                       Thank you, Peggyann Juba, PharmD, BCPS 06/20/2020  5:58 PM

## 2020-06-20 NOTE — ED Triage Notes (Signed)
Patient presents from home with weakness post fall last week. Syncope lead to the fall. She now complains of general body aches and shortness of breath. On room air the patient sats were 84%. They increased to 95% once placed on O2. She is A&O x4. HX: Lung cancer, neuropathy, hard of hearing   EMS vitals:  150/80 BP 84 HR 18 Resp Rate 102 CBG 98.4 Temp 95% SPO2 on 3L O2.

## 2020-06-20 NOTE — ED Provider Notes (Signed)
[place] DEPT Provider Note   CSN: [id number] Arrival date & time: 06/20/20  1501     History Chief Complaint  Patient presents with   Shortness of Breath   Weakness    Patient is an 87 year old female with a history of abnormal gait, hypertension,, complex partial seizures who is presenting today for a near syncopal events.  She was seen on  06/12/20 and at that time "Patient reports on Sunday she was walking out of the bathroom with her walker and had no preceding symptoms and woke up on the floor.  She reports she was able to finally stand and had been okay but just generally sore until today her daughter was there and she came out of the bathroom and her daughter reported she just stopped and started staring in the distance and was not responding.  Daughter helped her to the floor and she reports that she came around quickly after being lowered to the floor."  Hx is taken by the patient and EMS at bedside. According to EMS the patient has had steady decline, progressive weakness and inability to ambulate since her fall.  She was found to be tachypneic into the 30s with oxygen saturations in the high 80s and was placed on 4 L of oxygen via nasal cannula with improvement in her oxygen saturations.  She is unable to tell me if she injured anything but does state that she has pain "all over my body" which has been going on for about 2 months.  Patient also reports that she was diagnosed with lung cancer recently.She denies chest pain,shortness of breath. She is unsure if she hit her head.   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     Family History  Problem Relation Age of Onset   Heart Problems Mother    Heart Problems Father     Social History   Tobacco Use   Smoking status: Never Smoker   Smokeless tobacco: Never Used  Substance Use Topics   Alcohol use: No    Alcohol/week: 0.0 standard drinks   Drug use: No    Home Medications Prior to Admission medications   Medication Sig Start Date End Date Taking? Authorizing Provider  amLODipine (NORVASC) 5 MG tablet Take 5 mg by mouth daily. 06/20/18  Yes [provider]  cetirizine (ZYRTEC ALLERGY) 10 MG tablet Take 1 tablet (10 mg total) by mouth daily. 06/12/20  Yes Plunkett, Loree Fee, MD  Multiple Vitamins-Minerals (SENTRY SENIOR PO) Take 1 tablet by mouth daily.   Yes [provider]  ciprofloxacin (CIPRO) 500 MG tablet Take 1 tablet (500 mg  total) by mouth 2 (two) times daily. Patient not taking: Reported on 06/20/2020 06/12/20   Truddie Hidden, MD  diclofenac sodium (VOLTAREN) 1 % GEL Apply 2 g topically 4 (four) times daily. Rub into affected area of foot 2 to 4 times daily Patient not taking: No sig reported 09/01/18   Trula Slade, DPM  lidocaine-prilocaine (EMLA) cream Apply to feet as needed. Patient not taking: No sig reported 04/14/18   Marcial Pacas, MD  MELATONIN PO Take 1 tablet by mouth at bedtime as needed (sleep).    [provider]  zonisamide (ZONEGRAN) 100 MG capsule TAKE 1 CAPSULE TWICE DAILY. Patient not taking: No sig reported 04/25/18   Marcial Pacas, MD     Allergies    Biaxin [clarithromycin], Boniva [ibandronic acid], Ceftin [cefuroxime axetil], Dilantin [phenytoin sodium extended], Fosamax [alendronate sodium], Guaifenesin & derivatives, Imitrex [sumatriptan], Keflex [cephalexin], Macrobid [nitrofurantoin macrocrystal], Montelukast, Other, Penicillins, Premarin [conjugated estrogens], Sulfa antibiotics, Hctz [hydrochlorothiazide], and Latex  Review of Systems   Review of Systems Ten systems reviewed and are negative for acute change, except as noted in the HPI.   Physical Exam Updated Vital Signs BP 130/67 (BP Location: Right Arm)    Pulse 68    Temp 98.1 F (36.7 C) (Oral)    Resp (!) 24    Ht 4\' 11"  (1.499 m)    Wt 46.4 kg    SpO2 90%    BMI 20.66 kg/m   Physical Exam Vitals and nursing note reviewed.  Constitutional:      General: She is not in acute distress.    Appearance: She is well-developed. She is not diaphoretic.  HENT:     Head: Normocephalic and atraumatic.  Eyes:     General: No scleral icterus.    Conjunctiva/sclera: Conjunctivae normal.  Cardiovascular:     Rate and Rhythm: Normal rate and regular rhythm.     Heart sounds: Normal heart sounds. No murmur heard. No friction rub. No gallop.   Pulmonary:     Effort: Pulmonary effort is normal. Tachypnea present. No respiratory distress.     Breath sounds: Normal breath sounds. No rales.  Abdominal:     General: Bowel sounds are normal. There is no distension.     Palpations: Abdomen is soft. There is no mass.     Tenderness: There is no abdominal tenderness. There is no guarding.  Musculoskeletal:     Cervical back: Normal range of motion.  Skin:    General: Skin is warm and dry.  Neurological:     Mental Status: She is alert and oriented to person, place, and time.  Psychiatric:        Behavior: Behavior normal.     ED Results / Procedures / Treatments   Labs (all labs ordered are listed, but only abnormal results are displayed) Labs Reviewed  RESP  PANEL BY RT-PCR (FLU A&B, COVID) ARPGX2 - Abnormal; Notable for the following components:      Result Value   SARS Coronavirus 2 by RT PCR POSITIVE (*)    All other components within normal limits  CBC WITH DIFFERENTIAL/PLATELET - Abnormal; Notable for the following components:   WBC 3.5 (*)    All other components within normal limits  COMPREHENSIVE METABOLIC PANEL - Abnormal; Notable for the following components:   Sodium 132 (*)    Calcium 8.2 (*)    Total Protein 6.3 (*)    Albumin 3.1 (*)    AST 59 (*)  Total Bilirubin 1.5 (*)    All other components within normal limits  URINALYSIS, ROUTINE W REFLEX MICROSCOPIC - Abnormal; Notable for the following components:   Ketones, ur 20 (*)    All other components within normal limits  CBC WITH DIFFERENTIAL/PLATELET - Abnormal; Notable for the following components:   Lymphs Abs 0.5 (*)    Abs Immature Granulocytes 0.08 (*)    All other components within normal limits  COMPREHENSIVE METABOLIC PANEL - Abnormal; Notable for the following components:   Glucose, Bld 136 (*)    Calcium 7.7 (*)    Total Protein 5.2 (*)    Albumin 2.5 (*)    All other components within normal limits  C-REACTIVE PROTEIN - Abnormal; Notable for the following components:   CRP 2.0 (*)    All other components within normal limits  D-DIMER, QUANTITATIVE - Abnormal; Notable for the following components:   D-Dimer, Quant 3.76 (*)    All other components within normal limits  CULTURE, BLOOD (ROUTINE X 2)  CULTURE, BLOOD (ROUTINE X 2)  PROTIME-INR  LACTIC ACID, PLASMA  MAGNESIUM  PROCALCITONIN  MAGNESIUM  CBG MONITORING, ED    EKG EKG Interpretation  Date/Time:  Thursday June 20 2020 15:44:55 EDT Ventricular Rate:  70 PR Interval:    QRS Duration: 128 QT Interval:  387 QTC Calculation: 418 R Axis:   31 Text Interpretation: Sinus rhythm Atrial premature complexes Right bundle branch block 12 Lead; [place]-L[place]ar No STEMI Confirmed by Octaviano Glow  [phone]) on 06/20/2020 3:49:42 PM   Radiology CT HEAD WO CONTRAST  Result Date: 06/20/2020 CLINICAL DATA:  Weakness. EXAM: CT HEAD WITHOUT CONTRAST CT CERVICAL SPINE WITHOUT CONTRAST TECHNIQUE: Multidetector CT imaging of the head and cervical spine was performed following the standard protocol without intravenous contrast. Multiplanar CT image reconstructions of the cervical spine were also generated. COMPARISON:  None. FINDINGS: CT HEAD FINDINGS Brain: Mild diffuse cortical atrophy is noted. Mild chronic ischemic Mcnorton matter disease is noted. No mass effect or midline shift is noted. Ventricular size is within normal limits. There is no evidence of mass lesion, hemorrhage or acute infarction. Vascular: No hyperdense vessel or unexpected calcification. Skull: Normal. Negative for fracture or focal lesion. Sinuses/Orbits: No acute finding. Other: None. CT CERVICAL SPINE FINDINGS Alignment: Minimal grade 1 anterolisthesis of C4-5 is noted secondary to posterior facet joint hypertrophy. Skull base and vertebrae: No acute fracture. No primary bone lesion or focal pathologic process. Soft tissues and spinal canal: No prevertebral fluid or swelling. No visible canal hematoma. Disc levels: Severe degenerative disc disease is noted at C5-6. Moderate degenerative disc disease is noted at C6-7. Upper chest: Negative. Other: None. IMPRESSION: 1. Mild diffuse cortical atrophy. Mild chronic ischemic [place] matter disease. No acute intracranial abnormality seen. 2. Multilevel degenerative disc disease. No acute abnormality seen in the cervical spine. Electronically Signed   By: Marijo Conception M.D.   On: 06/20/2020 17:24   CT Chest Wo Contrast  Result Date: 06/20/2020 CLINICAL DATA:  Weakness. EXAM: CT CHEST WITHOUT CONTRAST TECHNIQUE: Multidetector CT imaging of the chest was performed following the standard protocol without IV contrast. COMPARISON:  June 12, 2020 FINDINGS: Cardiovascular: There is moderate severity  calcification of the aortic arch and descending thoracic aorta, without evidence of aneurysmal dilatation. Normal heart size. No pericardial effusion. Mediastinum/Nodes: No enlarged mediastinal or axillary lymph nodes. Thyroid gland, trachea, and esophagus demonstrate no significant findings. Lungs/Pleura: Centrilobular emphysematous lung disease is seen, bilaterally. A stable 4 mm calcified lung nodule  is seen within the posterior aspect of the left upper lobe. Marked severity peripheral, patchy airspace disease is again seen within the bilateral upper lobes and bilateral lower lobes. This is increased in severity when compared to the prior study. A 3.3 cm x 2.4 cm ill-defined mass-like area of airspace disease is seen within the right lung base. There is a very small right pleural effusion. No pneumothorax is identified. Upper Abdomen: Subcentimeter gallstones are seen within a contracted gallbladder. Musculoskeletal: Degenerative changes are seen throughout the thoracic spine. IMPRESSION: 1. Marked severity bilateral peripheral infiltrates, increased in severity when compared to the prior study. 2. Area of right lower lobe mass-like consolidation which may represent an area of pneumonia. Follow-up to resolution is recommended, as an underlying neoplastic process cannot be excluded. 3. Very small right pleural effusion. 4. Cholelithiasis. 5. Emphysema and aortic atherosclerosis. Aortic Atherosclerosis (ICD10-I70.0) and Emphysema (ICD10-J43.9). Electronically Signed   By: Virgina Norfolk M.D.   On: 06/20/2020 17:25   CT CERVICAL SPINE WO CONTRAST  Result Date: 06/20/2020 CLINICAL DATA:  Weakness. EXAM: CT HEAD WITHOUT CONTRAST CT CERVICAL SPINE WITHOUT CONTRAST TECHNIQUE: Multidetector CT imaging of the head and cervical spine was performed following the standard protocol without intravenous contrast. Multiplanar CT image reconstructions of the cervical spine were also generated. COMPARISON:  None. FINDINGS:  CT HEAD FINDINGS Brain: Mild diffuse cortical atrophy is noted. Mild chronic ischemic [place] matter disease is noted. No mass effect or midline shift is noted. Ventricular size is within normal limits. There is no evidence of mass lesion, hemorrhage or acute infarction. Vascular: No hyperdense vessel or unexpected calcification. Skull: Normal. Negative for fracture or focal lesion. Sinuses/Orbits: No acute finding. Other: None. CT CERVICAL SPINE FINDINGS Alignment: Minimal grade 1 anterolisthesis of C4-5 is noted secondary to posterior facet joint hypertrophy. Skull base and vertebrae: No acute fracture. No primary bone lesion or focal pathologic process. Soft tissues and spinal canal: No prevertebral fluid or swelling. No visible canal hematoma. Disc levels: Severe degenerative disc disease is noted at C5-6. Moderate degenerative disc disease is noted at C6-7. Upper chest: Negative. Other: None. IMPRESSION: 1. Mild diffuse cortical atrophy. Mild chronic ischemic [place]gar matter disease. No acute intracranial abnormality seen. 2. Multilevel degenerative disc disease. No acute abnormality seen in the cervical spine. Electronically Signed   By: Marijo Conception M.D.   On: 06/20/2020 17:24   DG Chest Port 1 View  Result Date: 06/20/2020 CLINICAL DATA:  Weakness. EXAM: PORTABLE CHEST 1 VIEW COMPARISON:  January 01, 2016 FINDINGS: The lungs are hyperinflated. Emphysematous lung disease is seen with chronic appearing increased interstitial lung markings. There is no evidence of focal consolidation, pleural effusion or pneumothorax. The heart size and mediastinal contours are within normal limits. There is mild calcification of the aortic arch. The visualized skeletal structures are unremarkable. IMPRESSION: COPD without acute or active cardiopulmonary disease. Electronically Signed   By: Virgina Norfolk M.D.   On: 06/20/2020 16:21    Procedures .Critical Care Performed by: Margarita Mail, PA-C Authorized by:  Margarita Mail, PA-C   Critical care provider statement:    Critical care time (minutes):  60   Critical care time was exclusive of:  Separately billable procedures and treating other patients   Critical care was necessary to treat or prevent imminent or life-threatening deterioration of the following conditions:  Respiratory failure   Critical care was time spent personally by me on the following activities:  Discussions with consultants, evaluation of patient's response to treatment, examination  of patient, ordering and performing treatments and interventions, ordering and review of laboratory studies, ordering and review of radiographic studies, pulse oximetry, re-evaluation of patient's condition, obtaining history from patient or surrogate and review of old charts   {  Medications Ordered in ED Medications  amLODipine (NORVASC) tablet 5 mg (5 mg Oral Given 06/22/20 1009)  melatonin tablet 10 mg (has no administration in time range)  loratadine (CLARITIN) tablet 10 mg (10 mg Oral Given 06/22/20 1009)  enoxaparin (LOVENOX) injection 40 mg (40 mg Subcutaneous Given 06/22/20 1013)  ascorbic acid (VITAMIN C) tablet 500 mg (500 mg Oral Given 06/22/20 1009)  zinc sulfate capsule 220 mg (220 mg Oral Given 06/22/20 1009)  acetaminophen (TYLENOL) tablet 650 mg (has no administration in time range)  polyethylene glycol (MIRALAX / GLYCOLAX) packet 17 g (has no administration in time range)  ondansetron (ZOFRAN) tablet 4 mg ( Oral See Alternative 06/21/20 2330)    Or  ondansetron (ZOFRAN) injection 4 mg (4 mg Intravenous Given 06/21/20 2330)  methylPREDNISolone sodium succinate (SOLU-MEDROL) 125 mg/2 mL injection 47.5 mg (47.5 mg Intravenous Given 06/22/20 1003)    Followed by  predniSONE (DELTASONE) tablet 50 mg (has no administration in time range)  chlorpheniramine-HYDROcodone (TUSSIONEX) 10-8 MG/5ML suspension 5 mL (has no administration in time range)  remdesivir 100 mg in sodium chloride 0.9 % 100  mL IVPB (0 mg Intravenous Stopped 06/21/20 0330)    Followed by  remdesivir 100 mg in sodium chloride 0.9 % 100 mL IVPB (0 mg Intravenous Stopped 06/21/20 0406)    Followed by  remdesivir 100 mg in sodium chloride 0.9 % 100 mL IVPB (100 mg Intravenous New Bag/Given 06/22/20 1008)  hydrALAZINE (APRESOLINE) injection 10 mg (has no administration in time range)  baricitinib (OLUMIANT) tablet 2 mg (2 mg Oral Given 06/22/20 1009)  Ipratropium-Albuterol (COMBIVENT) respimat 1 puff (1 puff Inhalation Given 06/22/20 1317)  aztreonam (AZACTAM) 2 g in sodium chloride 0.9 % 100 mL IVPB (0 g Intravenous Stopped 06/20/20 2015)  vancomycin (VANCOCIN) IVPB 1000 mg/200 mL premix (0 mg Intravenous Stopped 06/21/20 0326)  fentaNYL (SUBLIMAZE) injection 50 mcg (50 mcg Intravenous Given 06/21/20 2214)  acetaminophen (TYLENOL) tablet 650 mg (650 mg Oral Given 06/20/20 2016)    ED Course  I have reviewed the triage vital signs and the nursing notes.  Pertinent labs & imaging results that were available during my care of the patient were reviewed by me and considered in my medical decision making (see chart for details).  Clinical Course as of 06/22/20 1512  Thu Jun 20, 2020  1549 87 yo female here with worsening weakness, unable to ambulate with walker anymore, diagnosed with lung cancer this year, not on active treatment.  Several falls at home, unable to get out of bed.  Hypoxic at home, here requiring 4L . [MT]    Clinical Course User Index [MT] Trifan, Carola Rhine, MD   MDM Rules/Calculators/A&P                          87 year old female here with weakness and hypoxia.The differential diagnosis of weakness includes but is not limited to neurologic causes (GBS, myasthenia gravis, CVA, MS, ALS, transverse myelitis, spinal cord injury, CVA, botulism, ) and other causes: ACS, Arrhythmia, syncope, orthostatic hypotension, sepsis, hypoglycemia, electrolyte disturbance, hypothyroidism, respiratory failure, symptomatic  anemia, dehydration, heat injury, polypharmacy, malignancy. Patient arrives with hypoxia and tachypnea on 4 L via nasal cannula which is a new oxygen requirement.  The patient had a CT angiogram just a few days ago when she was here for a previous fall.  She does have a lung cancer.  I ordered and reviewed labs which include CBC which shows a mild leukopenia, CMP with mild hyponatremia and low calcium levels, mildly elevated bilirubin, PT/INR magnesium lactic acid and urine appear within normal limits.  Respiratory panel is positive for Covid.  I ordered and reviewed images which included CT chest without contrast, CT head, C-spine and portable 1 view chest x-ray.  The CT head, sleep and C-spine show no significant abnormalities.  Portable 1 view chest x-ray shows known lung mass and CT chest shows worsening infiltrates as compared to previous CT angiogram of the chest.  Patient appears to have Covid pneumonia as the underlying cause of her weakness and hypoxia.  She was covered with Azactam and vancomycin and will be admitted to the hospitalist service.  Case discussed with Dr. Marlyce Huge.  Marland KitchenSULA FETTERLY was evaluated in Emergency Department on 06/22/2020 for the symptoms described in the history of present illness. She was evaluated in the context of the global COVID-19 pandemic, which necessitated consideration that the patient might be at risk for infection with the SARS-CoV-2 virus that causes COVID-19. Institutional protocols and algorithms that pertain to the evaluation of patients at risk for COVID-19 are in a state of rapid change based on information released by regulatory bodies including the CDC and federal and state organizations. These policies and algorithms were followed during the patient's care in the ED.  Final Clinical Impression(s) / ED Diagnoses Final diagnoses:  Multilobar lung infiltrate  Acute respiratory failure with hypoxia ([place])  Generalized weakness    Rx / DC Orders ED  Discharge Orders    None       Margarita Mail, PA-C 06/22/20 1516    Wyvonnia Dusky, MD 06/23/20 1315

## 2020-06-21 ENCOUNTER — Encounter (HOSPITAL_COMMUNITY): Payer: Self-pay | Admitting: Internal Medicine

## 2020-06-21 ENCOUNTER — Encounter (HOSPITAL_COMMUNITY): Payer: Medicare HMO

## 2020-06-21 DIAGNOSIS — R338 Other retention of urine: Secondary | ICD-10-CM | POA: Diagnosis present

## 2020-06-21 DIAGNOSIS — Z91040 Latex allergy status: Secondary | ICD-10-CM | POA: Diagnosis not present

## 2020-06-21 DIAGNOSIS — Z881 Allergy status to other antibiotic agents status: Secondary | ICD-10-CM | POA: Diagnosis not present

## 2020-06-21 DIAGNOSIS — U071 COVID-19: Secondary | ICD-10-CM | POA: Diagnosis present

## 2020-06-21 DIAGNOSIS — R296 Repeated falls: Secondary | ICD-10-CM | POA: Diagnosis present

## 2020-06-21 DIAGNOSIS — N139 Obstructive and reflux uropathy, unspecified: Secondary | ICD-10-CM | POA: Diagnosis present

## 2020-06-21 DIAGNOSIS — I10 Essential (primary) hypertension: Secondary | ICD-10-CM | POA: Diagnosis present

## 2020-06-21 DIAGNOSIS — Z888 Allergy status to other drugs, medicaments and biological substances status: Secondary | ICD-10-CM | POA: Diagnosis not present

## 2020-06-21 DIAGNOSIS — C3431 Malignant neoplasm of lower lobe, right bronchus or lung: Secondary | ICD-10-CM | POA: Diagnosis present

## 2020-06-21 DIAGNOSIS — G40209 Localization-related (focal) (partial) symptomatic epilepsy and epileptic syndromes with complex partial seizures, not intractable, without status epilepticus: Secondary | ICD-10-CM | POA: Diagnosis present

## 2020-06-21 DIAGNOSIS — G9341 Metabolic encephalopathy: Secondary | ICD-10-CM | POA: Diagnosis present

## 2020-06-21 DIAGNOSIS — J9601 Acute respiratory failure with hypoxia: Secondary | ICD-10-CM | POA: Diagnosis present

## 2020-06-21 DIAGNOSIS — J439 Emphysema, unspecified: Secondary | ICD-10-CM | POA: Diagnosis present

## 2020-06-21 DIAGNOSIS — C3491 Malignant neoplasm of unspecified part of right bronchus or lung: Secondary | ICD-10-CM | POA: Diagnosis present

## 2020-06-21 DIAGNOSIS — Z887 Allergy status to serum and vaccine status: Secondary | ICD-10-CM | POA: Diagnosis not present

## 2020-06-21 DIAGNOSIS — Z79899 Other long term (current) drug therapy: Secondary | ICD-10-CM | POA: Diagnosis not present

## 2020-06-21 DIAGNOSIS — J1282 Pneumonia due to coronavirus disease 2019: Secondary | ICD-10-CM | POA: Diagnosis present

## 2020-06-21 DIAGNOSIS — Z882 Allergy status to sulfonamides status: Secondary | ICD-10-CM | POA: Diagnosis not present

## 2020-06-21 DIAGNOSIS — J9600 Acute respiratory failure, unspecified whether with hypoxia or hypercapnia: Secondary | ICD-10-CM | POA: Diagnosis not present

## 2020-06-21 LAB — URINALYSIS, ROUTINE W REFLEX MICROSCOPIC
Bilirubin Urine: NEGATIVE
Glucose, UA: NEGATIVE mg/dL
Hgb urine dipstick: NEGATIVE
Ketones, ur: 20 mg/dL — AB
Leukocytes,Ua: NEGATIVE
Nitrite: NEGATIVE
Protein, ur: NEGATIVE mg/dL
Specific Gravity, Urine: 1.009 (ref 1.005–1.030)
pH: 6 (ref 5.0–8.0)

## 2020-06-21 MED ORDER — IPRATROPIUM-ALBUTEROL 20-100 MCG/ACT IN AERS
1.0000 | INHALATION_SPRAY | Freq: Four times a day (QID) | RESPIRATORY_TRACT | Status: DC
  Administered 2020-06-21 – 2020-06-23 (×10): 1 via RESPIRATORY_TRACT
  Filled 2020-06-21: qty 4

## 2020-06-21 MED ORDER — HYDRALAZINE HCL 20 MG/ML IJ SOLN: 10.0000 mg | Freq: Four times a day (QID) | INTRAMUSCULAR | Status: DC | PRN

## 2020-06-21 MED ORDER — MELATONIN 5 MG PO TABS
10.0000 mg | ORAL_TABLET | Freq: Every evening | ORAL | Status: DC | PRN
  Administered 2020-06-22 – 2020-07-01 (×9): 10 mg via ORAL
  Filled 2020-06-21 (×9): qty 2

## 2020-06-21 MED ORDER — BARICITINIB 2 MG PO TABS
2.0000 mg | ORAL_TABLET | Freq: Every day | ORAL | Status: DC
  Administered 2020-06-21 – 2020-06-23 (×3): 2 mg via ORAL
  Filled 2020-06-21 (×3): qty 1

## 2020-06-21 MED ORDER — ONDANSETRON HCL 4 MG/2ML IJ SOLN
4.0000 mg | Freq: Four times a day (QID) | INTRAMUSCULAR | Status: DC | PRN
  Administered 2020-06-21: 4 mg via INTRAVENOUS
  Filled 2020-06-21: qty 2

## 2020-06-21 MED ORDER — ONDANSETRON HCL 4 MG PO TABS
4.0000 mg | ORAL_TABLET | Freq: Four times a day (QID) | ORAL | Status: DC | PRN
  Administered 2020-06-25: 4 mg via ORAL
  Filled 2020-06-21: qty 1

## 2020-06-21 MED ORDER — SODIUM CHLORIDE 0.9 % IV SOLN
100.0000 mg | Freq: Every day | INTRAVENOUS | Status: AC
  Administered 2020-06-22 – 2020-06-25 (×4): 100 mg via INTRAVENOUS
  Filled 2020-06-21 (×4): qty 20

## 2020-06-21 MED ORDER — ZINC SULFATE 220 (50 ZN) MG PO CAPS
220.0000 mg | ORAL_CAPSULE | Freq: Every day | ORAL | Status: DC
  Administered 2020-06-21 – 2020-07-02 (×12): 220 mg via ORAL
  Filled 2020-06-21 (×12): qty 1

## 2020-06-21 MED ORDER — ACETAMINOPHEN 325 MG PO TABS
650.0000 mg | ORAL_TABLET | Freq: Four times a day (QID) | ORAL | Status: DC | PRN
  Administered 2020-06-27 – 2020-07-01 (×2): 650 mg via ORAL
  Filled 2020-06-21 (×5): qty 2

## 2020-06-21 MED ORDER — HYDROCOD POLST-CPM POLST ER 10-8 MG/5ML PO SUER: 5.0000 mL | Freq: Two times a day (BID) | ORAL | Status: DC | PRN

## 2020-06-21 MED ORDER — ALBUTEROL SULFATE HFA 108 (90 BASE) MCG/ACT IN AERS: 2.0000 | INHALATION_SPRAY | RESPIRATORY_TRACT | Status: DC | PRN

## 2020-06-21 MED ORDER — METHYLPREDNISOLONE SODIUM SUCC 125 MG IJ SOLR
1.0000 mg/kg | Freq: Two times a day (BID) | INTRAMUSCULAR | Status: AC
  Administered 2020-06-21 – 2020-06-23 (×6): 47.5 mg via INTRAVENOUS
  Filled 2020-06-21 (×6): qty 2

## 2020-06-21 MED ORDER — PREDNISONE 50 MG PO TABS
50.0000 mg | ORAL_TABLET | Freq: Every day | ORAL | Status: DC
  Administered 2020-06-24 – 2020-06-27 (×4): 50 mg via ORAL
  Filled 2020-06-21 (×4): qty 1

## 2020-06-21 MED ORDER — LORATADINE 10 MG PO TABS
10.0000 mg | ORAL_TABLET | Freq: Every day | ORAL | Status: DC
  Administered 2020-06-21 – 2020-07-02 (×12): 10 mg via ORAL
  Filled 2020-06-21 (×12): qty 1

## 2020-06-21 MED ORDER — ASCORBIC ACID 500 MG PO TABS
500.0000 mg | ORAL_TABLET | Freq: Every day | ORAL | Status: DC
  Administered 2020-06-21 – 2020-07-02 (×12): 500 mg via ORAL
  Filled 2020-06-21 (×12): qty 1

## 2020-06-21 MED ORDER — SODIUM CHLORIDE 0.9 % IV SOLN: 100.0000 mg | Freq: Every day | INTRAVENOUS | Status: DC

## 2020-06-21 MED ORDER — POLYETHYLENE GLYCOL 3350 17 G PO PACK
17.0000 g | PACK | Freq: Every day | ORAL | Status: DC | PRN
  Administered 2020-06-23: 17 g via ORAL
  Filled 2020-06-21: qty 1

## 2020-06-21 MED ORDER — SODIUM CHLORIDE 0.9 % IV SOLN
100.0000 mg | Freq: Once | INTRAVENOUS | Status: AC
  Administered 2020-06-21: 100 mg via INTRAVENOUS
  Filled 2020-06-21: qty 20

## 2020-06-21 MED ORDER — ENOXAPARIN SODIUM 40 MG/0.4ML ~~LOC~~ SOLN
40.0000 mg | SUBCUTANEOUS | Status: DC
  Administered 2020-06-21 – 2020-07-02 (×12): 40 mg via SUBCUTANEOUS
  Filled 2020-06-21 (×12): qty 0.4

## 2020-06-21 MED ORDER — SODIUM CHLORIDE 0.9 % IV SOLN: 200.0000 mg | Freq: Once | INTRAVENOUS | Status: DC

## 2020-06-21 MED ORDER — AMLODIPINE BESYLATE 5 MG PO TABS
5.0000 mg | ORAL_TABLET | Freq: Every day | ORAL | Status: DC
  Administered 2020-06-22 – 2020-06-27 (×6): 5 mg via ORAL
  Filled 2020-06-21 (×7): qty 1

## 2020-06-21 NOTE — ED Notes (Signed)
Placed patient on bedpan

## 2020-06-21 NOTE — Progress Notes (Signed)
PROGRESS NOTE    Melanie Walker  EPP:[id number] DOB: Feb 11, 1931 DOA: 06/20/2020 PCP: Lajean Manes, MD    Brief Narrative:  Melanie Walker is an 87 year old female with past medical history significant for COPD, essential hypertension who presented to the ED with progressive generalized weakness, shortness of breath with multiple falls and confusion.  Patient is a poor historian from suspect encephalopathy and history obtained from both the patient and daughter who is present at bedside as well as discussions with emergency department staff.  Dyspnea worse with exertion and improved with rest.  Also patient with lower abdominal pain with difficulty urinating.  Recently diagnosed with a urinary tract infection and completed course of ciprofloxacin.  Although believe her symptoms are related to urinary retention.  In the ED, temperature 98.5 F, HR 72, RR 22, BP 156/78, SPO2 96% on 4 L nasal cannula (not oxygen dependent at baseline).  Sodium 132, potassium 3.8, chloride 100, CO2 23, glucose 88, BUN 15, creatinine 0.66, magnesium 2.1, AST 59, ALT 34, total bilirubin 1.5.  Lactic acid 1.0.  Procalcitonin less than 0.10.  WBC 3.5, hemoglobin 14.9, platelets 314.  Influenza A/B PCR negative.  SARS-CoV-2/Covid-19 PCR positive.  Chest x-ray with COPD without acute cardiopulmonary disease process.  CT head/C-spine without contrast with mild diffuse cortical atrophy and chronic ischemic Rae matter disease without acute intracranial abnormality and multilevel degenerative disc disease with no acute abnormality in the cervical spine.  CT chest with marked severity bilateral peripheral infiltrates with area of right lower lobe masslike consolidation consistent with pneumonia versus atelectasis with small right pleural effusion.  Hospitalist service consulted for further evaluation of acute hypoxic respite failure secondary to Covid-19 viral pneumonia.   Assessment & Plan:   Principal Problem:   Acute respiratory  failure due to COVID-19 [facility]) Active Problems:   Complex partial seizure ([place])   Essential hypertension   Acute urinary retention   Acute metabolic encephalopathy   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HCC)   COPD with emphysema (HCC)   Acute hypoxic respiratory failure secondary to acute Covid-19 viral pneumonia during the ongoing Covid 19 Pandemic - POA Patient presents with several week history of progressive weakness, shortness of breath.  Patient is unvaccinated.  On arrival to the ED, patient with significant hypoxia with SPO2 in the 80s on room air.  Patient was found to have multifocal infiltrates on CT chest.  Covid-19 PCR positive.  --COVID test: + PCR 06/20/20 --CRP pending --ddimer pending --Remdesivir, plan 5-day course (Day #1/5) --Baricitinib 4mg  PO daily (Day #1/14 ) --Continue Solumedrol 47.5mg  IV BID --prone for 2-3hrs every 12hrs if able --Continue supplemental oxygen, titrate to maintain SPO2 greater than 88%, on 8L [place] w/ SpO2 96% --Combivent MDI 1 puff every 6 hours --Continue supportive care with vitamin C, zinc, Tylenol, antitussives  --Follow CBC, CMP, D-dimer, and CRP daily --Continue airborne/contact isolation precautions   The treatment plan and use of medications and known side effects were discussed with patient/family. Some of the medications used are based on case reports/anecdotal data.  All other medications being used in the management of COVID-19 based on limited study data.  Complete risks and long-term side effects are unknown, however in the best clinical judgment they seem to be of some benefit.  Patient wanted to proceed with treatment options provided.  Essential hypertension --Amlodipine 5 mg p.o. daily   DVT prophylaxis: Lovenox   Code Status: Full Code Family Communication: Updated patient's daughter who is present at bedside  Disposition  Plan:  Level of care: Progressive Status is: Inpatient  Remains inpatient appropriate  because:Ongoing diagnostic testing needed not appropriate for outpatient work up, Unsafe d/c plan, IV treatments appropriate due to intensity of illness or inability to take PO and Inpatient level of care appropriate due to severity of illness   Dispo: The patient is from: Home              Anticipated d/c is to: Home              Patient currently is not medically stable to d/c.   Difficult to place patient No  Consultants:   none  Procedures:   none  Antimicrobials:   none   Subjective: Patient seen and examined bedside, resting comfortably.  Daughter present.  Oxygen requirements increasing up to 8 L nasal cannula this morning.  Daughter reports mother unvaccinated due to history of "multiple allergies".  Discussed addition of baricitinib for further immunosuppression given worsening respiratory status, and agree to proceed with treatment.  Continues with weakness, fatigue.  Mental status much improved per daughter's report now that oxygenation improved with supplemental oxygen.  Patient denies headache, no visual changes, no chest pain, palpitations, no abdominal pain, no fever/chills/night sweats, no acute concerns this morning per nursing staff.  Objective: Vitals:   06/21/20 0815 06/21/20 0830 06/21/20 1000 06/21/20 1100  BP:   (!) 154/78 140/77  Pulse: (!) 59 68 69 74  Resp: (!) 25 13 14 14   Temp:      TempSrc:      SpO2: 93% 93% 91% 94%    Intake/Output Summary (Last 24 hours) at 06/21/2020 1113 Last data filed at 06/21/2020 0406 Gross per 24 hour  Intake 1446.37 ml  Output 850 ml  Net 596.37 ml   There were no vitals filed for this visit.  Examination:  General exam: Appears calm and comfortable, ill in appearance Respiratory system: Clear to auscultation. Respiratory effort normal.  On 8 L nasal cannula SPO2 96% Cardiovascular system: S1 & S2 heard, RRR. No JVD, murmurs, rubs, gallops or clicks. No pedal edema. Gastrointestinal system: Abdomen is  nondistended, soft and nontender. No organomegaly or masses felt. Normal bowel sounds heard. Central nervous system: Alert and oriented. No focal neurological deficits. Extremities: Symmetric 5 x 5 power. Skin: No rashes, lesions or ulcers Psychiatry: Judgement and insight appear poor. Mood & affect appropriate.     Data Reviewed: I have personally reviewed following labs and imaging studies  CBC: Recent Labs  Lab 06/20/20 1600  WBC 3.5*  NEUTROABS 2.4  HGB 14.9  HCT 45.1  MCV 89.7  PLT 382   Basic Metabolic Panel: Recent Labs  Lab 06/20/20 1600  NA 132*  K 3.8  CL 100  CO2 23  GLUCOSE 88  BUN 15  CREATININE 0.66  CALCIUM 8.2*  MG 2.1   GFR: Estimated Creatinine Clearance: 33.8 mL/min (by C-G formula based on SCr of 0.66 mg/dL). Liver Function Tests: Recent Labs  Lab 06/20/20 1600  AST 59*  ALT 34  ALKPHOS 87  BILITOT 1.5*  PROT 6.3*  ALBUMIN 3.1*   No results for input(s): LIPASE, AMYLASE in the last 168 hours. No results for input(s): AMMONIA in the last 168 hours. Coagulation Profile: Recent Labs  Lab 06/20/20 1600  INR 0.9   Cardiac Enzymes: No results for input(s): CKTOTAL, CKMB, CKMBINDEX, TROPONINI in the last 168 hours. BNP (last 3 results) No results for input(s): PROBNP in the last 8760 hours. HbA1C: No results  for input(s): HGBA1C in the last 72 hours. CBG: Recent Labs  Lab 06/20/20 1720  GLUCAP 82   Lipid Profile: No results for input(s): CHOL, HDL, LDLCALC, TRIG, CHOLHDL, LDLDIRECT in the last 72 hours. Thyroid Function Tests: No results for input(s): TSH, T4TOTAL, FREET4, T3FREE, THYROIDAB in the last 72 hours. Anemia Panel: No results for input(s): VITAMINB12, FOLATE, FERRITIN, TIBC, IRON, RETICCTPCT in the last 72 hours. Sepsis Labs: Recent Labs  Lab 06/20/20 1600  PROCALCITON <0.10  LATICACIDVEN 1.0    Recent Results (from the past 240 hour(s))  Urine Culture     Status: Abnormal   Collection Time: 06/12/20  5:16 PM    Specimen: Urine, Random  Result Value Ref Range Status   Specimen Description   Final    URINE, RANDOM Performed at [facility] [address], [place], [place] [address]    Special Requests   Final    NONE Performed at [facility], [place] [address], [place], [place] [address]    Culture MULTIPLE SPECIES PRESENT, SUGGEST RECOLLECTION (A)  Final   Report Status 06/14/2020 FINAL  Final  Blood Culture (routine x 2)     Status: None (Preliminary result)   Collection Time: 06/20/20  3:57 PM   Specimen: BLOOD  Result Value Ref Range Status   Specimen Description   Final    BLOOD SITE NOT SPECIFIED Performed at [facility] [address] [place], [place] [address]    Special Requests   Final    BOTTLES DRAWN AEROBIC AND ANAEROBIC Blood Culture adequate volume Performed at [facility] [address] [place],  [address]    Culture PENDING  Incomplete   Report Status PENDING  Incomplete  Resp Panel by RT-PCR (Flu A&B, Covid) Nasopharyngeal Swab     Status: Abnormal   Collection Time: 06/20/20  5:23 PM   Specimen: Nasopharyngeal Swab; Nasopharyngeal(NP) swabs in vial transport medium  Result Value Ref Range Status   SARS Coronavirus 2 by RT PCR POSITIVE (A) NEGATIVE Final    Comment: RESULT CALLED TO, READ BACK BY AND VERIFIED WITH: BROOKS B. 03.17.22 @ 2144 BY MECAIL J. (NOTE) SARS-CoV-2 target nucleic acids are DETECTED.  The SARS-CoV-2 RNA is generally detectable in upper respiratory specimens during the acute phase of infection. Positive results are indicative of the presence of the identified virus, but do not rule out bacterial infection or co-infection with other pathogens not detected by the test. Clinical correlation with patient history and other diagnostic information is necessary to determine patient infection status. The expected result is Negative.  Fact Sheet for  Patients: EntrepreneurPulse.com.au  Fact Sheet for Healthcare Providers: IncredibleEmployment.be  This test is not yet approved or cleared by the [place] FDA and  has been authorized for detection and/or diagnosis of SARS-CoV-2 by FDA under an Emergency Use Authorization (EUA).  This EUA will remain in effect (meaning this test c an be used) for the duration of  the COVID-19 declaration under Section 564(b)(1) of the Act, 21 U.S.C. section 360bbb-3(b)(1), unless the authorization is terminated or revoked sooner.     Influenza A by PCR NEGATIVE NEGATIVE Final   Influenza B by PCR NEGATIVE NEGATIVE Final    Comment: (NOTE) The Xpert Xpress SARS-CoV-2/FLU/RSV plus assay is intended as an aid in the diagnosis of influenza from Nasopharyngeal swab specimens and should not be used as a sole basis for treatment. Nasal washings and aspirates are unacceptable for Xpert Xpress SARS-CoV-2/FLU/RSV  testing.  Fact Sheet for Patients: EntrepreneurPulse.com.au  Fact Sheet for Healthcare Providers: IncredibleEmployment.be  This test is not yet approved or cleared by the [place] FDA and has been authorized for detection and/or diagnosis of SARS-CoV-2 by FDA under an Emergency Use Authorization (EUA). This EUA will remain in effect (meaning this test can be used) for the duration of the COVID-19 declaration under Section 564(b)(1) of the Act, 21 U.S.C. section 360bbb-3(b)(1), unless the authorization is terminated or revoked.  Performed at [facility], [place] [address] [place], [place] [address]          Radiology Studies: CT HEAD WO CONTRAST  Result Date: 06/20/2020 CLINICAL DATA:  Weakness. EXAM: CT HEAD WITHOUT CONTRAST CT CERVICAL SPINE WITHOUT CONTRAST TECHNIQUE: Multidetector CT imaging of the head and cervical spine was performed following the standard protocol without  intravenous contrast. Multiplanar CT image reconstructions of the cervical spine were also generated. COMPARISON:  None. FINDINGS: CT HEAD FINDINGS Brain: Mild diffuse cortical atrophy is noted. Mild chronic ischemic [place] matter disease is noted. No mass effect or midline shift is noted. Ventricular size is within normal limits. There is no evidence of mass lesion, hemorrhage or acute infarction. Vascular: No hyperdense vessel or unexpected calcification. Skull: Normal. Negative for fracture or focal lesion. Sinuses/Orbits: No acute finding. Other: None. CT CERVICAL SPINE FINDINGS Alignment: Minimal grade 1 anterolisthesis of C4-5 is noted secondary to posterior facet joint hypertrophy. Skull base and vertebrae: No acute fracture. No primary bone lesion or focal pathologic process. Soft tissues and spinal canal: No prevertebral fluid or swelling. No visible canal hematoma. Disc levels: Severe degenerative disc disease is noted at C5-6. Moderate degenerative disc disease is noted at C6-7. Upper chest: Negative. Other: None. IMPRESSION: 1. Mild diffuse cortical atrophy. Mild chronic ischemic Friedly matter disease. No acute intracranial abnormality seen. 2. Multilevel degenerative disc disease. No acute abnormality seen in the cervical spine. Electronically Signed   By: Marijo Conception M.D.   On: 06/20/2020 17:24   CT Chest Wo Contrast  Result Date: 06/20/2020 CLINICAL DATA:  Weakness. EXAM: CT CHEST WITHOUT CONTRAST TECHNIQUE: Multidetector CT imaging of the chest was performed following the standard protocol without IV contrast. COMPARISON:  June 12, 2020 FINDINGS: Cardiovascular: There is moderate severity calcification of the aortic arch and descending thoracic aorta, without evidence of aneurysmal dilatation. Normal heart size. No pericardial effusion. Mediastinum/Nodes: No enlarged mediastinal or axillary lymph nodes. Thyroid gland, trachea, and esophagus demonstrate no significant findings. Lungs/Pleura:  Centrilobular emphysematous lung disease is seen, bilaterally. A stable 4 mm calcified lung nodule is seen within the posterior aspect of the left upper lobe. Marked severity peripheral, patchy airspace disease is again seen within the bilateral upper lobes and bilateral lower lobes. This is increased in severity when compared to the prior study. A 3.3 cm x 2.4 cm ill-defined mass-like area of airspace disease is seen within the right lung base. There is a very small right pleural effusion. No pneumothorax is identified. Upper Abdomen: Subcentimeter gallstones are seen within a contracted gallbladder. Musculoskeletal: Degenerative changes are seen throughout the thoracic spine. IMPRESSION: 1. Marked severity bilateral peripheral infiltrates, increased in severity when compared to the prior study. 2. Area of right lower lobe mass-like consolidation which may represent an area of pneumonia. Follow-up to resolution is recommended, as an underlying neoplastic process cannot be excluded. 3. Very small right pleural effusion. 4. Cholelithiasis. 5. Emphysema and aortic atherosclerosis. Aortic Atherosclerosis (ICD10-I70.0) and Emphysema (ICD10-J43.9). Electronically Signed  By: Virgina Norfolk M.D.   On: 06/20/2020 17:25   CT CERVICAL SPINE WO CONTRAST  Result Date: 06/20/2020 CLINICAL DATA:  Weakness. EXAM: CT HEAD WITHOUT CONTRAST CT CERVICAL SPINE WITHOUT CONTRAST TECHNIQUE: Multidetector CT imaging of the head and cervical spine was performed following the standard protocol without intravenous contrast. Multiplanar CT image reconstructions of the cervical spine were also generated. COMPARISON:  None. FINDINGS: CT HEAD FINDINGS Brain: Mild diffuse cortical atrophy is noted. Mild chronic ischemic [place] matter disease is noted. No mass effect or midline shift is noted. Ventricular size is within normal limits. There is no evidence of mass lesion, hemorrhage or acute infarction. Vascular: No hyperdense vessel or  unexpected calcification. Skull: Normal. Negative for fracture or focal lesion. Sinuses/Orbits: No acute finding. Other: None. CT CERVICAL SPINE FINDINGS Alignment: Minimal grade 1 anterolisthesis of C4-5 is noted secondary to posterior facet joint hypertrophy. Skull base and vertebrae: No acute fracture. No primary bone lesion or focal pathologic process. Soft tissues and spinal canal: No prevertebral fluid or swelling. No visible canal hematoma. Disc levels: Severe degenerative disc disease is noted at C5-6. Moderate degenerative disc disease is noted at C6-7. Upper chest: Negative. Other: None. IMPRESSION: 1. Mild diffuse cortical atrophy. Mild chronic ischemic Le[place]y matter disease. No acute intracranial abnormality seen. 2. Multilevel degenerative disc disease. No acute abnormality seen in the cervical spine. Electronically Signed   By: Marijo Conception M.D.   On: 06/20/2020 17:24   DG Chest Port 1 View  Result Date: 06/20/2020 CLINICAL DATA:  Weakness. EXAM: PORTABLE CHEST 1 VIEW COMPARISON:  January 01, 2016 FINDINGS: The lungs are hyperinflated. Emphysematous lung disease is seen with chronic appearing increased interstitial lung markings. There is no evidence of focal consolidation, pleural effusion or pneumothorax. The heart size and mediastinal contours are within normal limits. There is mild calcification of the aortic arch. The visualized skeletal structures are unremarkable. IMPRESSION: COPD without acute or active cardiopulmonary disease. Electronically Signed   By: Virgina Norfolk M.D.   On: 06/20/2020 16:21        Scheduled Meds: . amLODipine  5 mg Oral Daily  . vitamin C  500 mg Oral Daily  . baricitinib  2 mg Oral Daily  . enoxaparin (LOVENOX) injection  40 mg Subcutaneous Q24H  . fentaNYL (SUBLIMAZE) injection  50 mcg Intravenous Once  . loratadine  10 mg Oral Daily  . methylPREDNISolone (SOLU-MEDROL) injection  1 mg/kg Intravenous Q12H   Followed by  . [START ON 06/24/2020]  predniSONE  50 mg Oral Daily  . ondansetron (ZOFRAN) IV  4 mg Intravenous Once  . zinc sulfate  220 mg Oral Daily   Continuous Infusions: . [START ON 06/22/2020] remdesivir 100 mg in NS 100 mL       LOS: 0 days    Time spent: 39 minutes spent on chart review, discussion with nursing staff, consultants, updating family and interview/physical exam; more than 50% of that time was spent in counseling and/or coordination of care.    Eric J [place] DO [facility] Available via Epic secure chat 7am-7pm After these hours, please refer to coverage provider listed on [email or web address] 06/21/2020, 11:13 AM

## 2020-06-21 NOTE — H&P (Signed)
History and Physical    Melanie Walker FVC:[id number] DOB: 1930-04-16 DOA: 06/20/2020  PCP: Lajean Manes, MD  Patient coming from: Home    Chief Complaint:  Chief Complaint  Patient presents with  . Shortness of Breath  . Weakness     HPI:    87 year old female with past medical history of complex partial seizures, emphysema, hypertension who presents to [facility] emergency department with multiple complaints including progressive generalized weakness, mild shortness of breath and multiple falls with loss of consciousness.  Patient is a somewhat poor historian due to his suspected encephalopathy and the history has been obtained from both the patient, daughter who is at the bedside as well as discussions with the emergency department staff.  Is been experiencing weakness for at least 2 weeks.  Weakness initially was mild in intensity but progressively has become more and more severe.  Weakness is associated with mild to moderate dyspnea, worse with exertion and improved with rest.  Weakness and dyspnea have been progressively worsening over the span of time.  On March 9, patient also was experiencing lower abdominal pain and difficulty with urination.  Presented to our emergency department where she was diagnosed with a urinary tract infection and was sent home with a course of ciprofloxacin.  Unfortunately, urine culture at that time was contaminated and the species associated with infection could not be determined.  In the days that followed, patient completed her course of antibiotic therapy and states that her lower abdominal pain did not improve.  Furthermore her weakness and shortness breath continued to worsen to the point where she lost consciousness and fell at home on at least 2 separate occasions.  Patient now presents to [facility] emergency department with progressive symptoms.  Upon evaluation in the emergency department patient was found to be quite  hypoxic on arrival with oxygen saturations in the mid 80s.  This was required 4 L of oxygen via nasal cannula.  CT imaging of the head and neck was performed considering the recent fall revealing no evidence of acute injury.  Noncontrast CT chest was performed revealing severe bilateral peripheral infiltrates with redemonstration of the right lower lobe mass that has been followed since December.  The hospitalist group was then called to assess the patient for admission to the hospital  Review of Systems:   Review of Systems  Unable to perform ROS: Mental status change    Past Medical History:  Diagnosis Date  . Cancer ([place])   . Seizure [facility])     Past Surgical History:  Procedure Laterality Date  . none       reports that she has never smoked. She has never used smokeless tobacco. She reports that she does not drink alcohol and does not use drugs.  Allergies  Allergen Reactions  . Biaxin [Clarithromycin]   . Boniva [Ibandronic Acid] Other (See Comments)    Upset stomach  . Ceftin [Cefuroxime Axetil]   . Dilantin [Phenytoin Sodium Extended]   . Fosamax [Alendronate Sodium] Other (See Comments)    Upset stomach  . Guaifenesin & Derivatives   . Imitrex [Sumatriptan]   . Keflex [Cephalexin]   . Macrobid [Nitrofurantoin Macrocrystal] Hives and Itching  . Montelukast   . Other     Flu shot made rash on arm and caused itching to arm  . Penicillins   . Premarin [Conjugated Estrogens] Itching  . Sulfa Antibiotics   . Hctz [Hydrochlorothiazide] Rash  . Latex Rash  Family History  Problem Relation Age of Onset  . Heart Problems Mother   . Heart Problems Father      Prior to Admission medications   Medication Sig Start Date End Date Taking? Authorizing Provider  amLODipine (NORVASC) 5 MG tablet Take 5 mg by mouth daily. 06/20/18  Yes [provider]  cetirizine (ZYRTEC ALLERGY) 10 MG tablet Take 1 tablet (10 mg total) by mouth daily. 06/12/20  Yes Plunkett, Loree Fee,  MD  Multiple Vitamins-Minerals (SENTRY SENIOR PO) Take 1 tablet by mouth daily.   Yes [provider]  ciprofloxacin (CIPRO) 500 MG tablet Take 1 tablet (500 mg total) by mouth 2 (two) times daily. Patient not taking: Reported on 06/20/2020 06/12/20   Truddie Hidden, MD  diclofenac sodium (VOLTAREN) 1 % GEL Apply 2 g topically 4 (four) times daily. Rub into affected area of foot 2 to 4 times daily Patient not taking: No sig reported 09/01/18   Trula Slade, DPM  lidocaine-prilocaine (EMLA) cream Apply to feet as needed. Patient not taking: No sig reported 04/14/18   Marcial Pacas, MD  MELATONIN PO Take 1 tablet by mouth at bedtime as needed (sleep).    [provider]  zonisamide (ZONEGRAN) 100 MG capsule TAKE 1 CAPSULE TWICE DAILY. Patient not taking: No sig reported 04/25/18   Marcial Pacas, MD    Physical Exam: Vitals:   06/20/20 2130 06/20/20 2300 06/20/20 2313 06/21/20 0151  BP: 120/89 122/61 122/61 (!) 169/83  Pulse: 65 (!) 57 (!) 56 84  Resp: (!) 22 20 (!) 24 20  Temp:      TempSrc:      SpO2: 92% 90% 93% 94%    Constitutional: Lethargic but arousable, intermittently following commands, in mild distress Skin: no rashes, no lesions, poor skin turgor noted.   Eyes: Pupils are equally reactive to light.  No evidence of scleral icterus or conjunctival pallor.  ENMT: Dry mucous membranes noted.  Posterior pharynx clear of any exudate or lesions.  Neck: normal, supple, no masses, no thyromegaly.  No evidence of jugular venous distension.   Respiratory: Coarse rales noted in the bilateral mid and lower fields.  No significant wheezing noted.  Increased respiratory effort with accessory muscle use. Cardiovascular: Regular rate and rhythm, no murmurs / rubs / gallops. No extremity edema. 2+ pedal pulses. No carotid bruits.  Chest:   Nontender without crepitus or deformity.   Back:   Nontender without crepitus or deformity. Abdomen: Severe lower abdominal tenderness with  notable lower abdominal fullness on palpation.  Positive bowel sounds noted in all quadrants.   Musculoskeletal: Diffuse tenderness noted out of proportion with exam of all extremities.  No joint deformity upper and lower extremities. Good ROM, no contractures. Normal muscle tone.  Neurologic: Patient is visibly anxious and somewhat confused throughout the interview.  CN 2-12 grossly intact. Sensation intact.  Patient moving all 4 extremities spontaneously.  Patient is following all commands.  Patient is responsive to verbal stimuli.   Psychiatric: Patient exhibits an anxious mood with labile affect.  Patient currently does not seem to possess insight as to her current situation.    Labs on Admission: I have personally reviewed following labs and imaging studies -   CBC: Recent Labs  Lab 06/20/20 1600  WBC 3.5*  NEUTROABS 2.4  HGB 14.9  HCT 45.1  MCV 89.7  PLT 409   Basic Metabolic Panel: Recent Labs  Lab 06/20/20 1600  NA 132*  K 3.8  CL 100  CO2 23  GLUCOSE 88  BUN 15  CREATININE 0.66  CALCIUM 8.2*  MG 2.1   GFR: Estimated Creatinine Clearance: 33.8 mL/min (by C-G formula based on SCr of 0.66 mg/dL). Liver Function Tests: Recent Labs  Lab 06/20/20 1600  AST 59*  ALT 34  ALKPHOS 87  BILITOT 1.5*  PROT 6.3*  ALBUMIN 3.1*   No results for input(s): LIPASE, AMYLASE in the last 168 hours. No results for input(s): AMMONIA in the last 168 hours. Coagulation Profile: Recent Labs  Lab 06/20/20 1600  INR 0.9   Cardiac Enzymes: No results for input(s): CKTOTAL, CKMB, CKMBINDEX, TROPONINI in the last 168 hours. BNP (last 3 results) No results for input(s): PROBNP in the last 8760 hours. HbA1C: No results for input(s): HGBA1C in the last 72 hours. CBG: Recent Labs  Lab 06/20/20 1720  GLUCAP 82   Lipid Profile: No results for input(s): CHOL, HDL, LDLCALC, TRIG, CHOLHDL, LDLDIRECT in the last 72 hours. Thyroid Function Tests: No results for input(s): TSH,  T4TOTAL, FREET4, T3FREE, THYROIDAB in the last 72 hours. Anemia Panel: No results for input(s): VITAMINB12, FOLATE, FERRITIN, TIBC, IRON, RETICCTPCT in the last 72 hours. Urine analysis:    Component Value Date/Time   COLORURINE YELLOW 06/12/2020 1716   APPEARANCEUR CLEAR 06/12/2020 1716   LABSPEC 1.035 (H) 06/12/2020 1716   PHURINE 6.0 06/12/2020 1716   GLUCOSEU NEGATIVE 06/12/2020 1716   HGBUR NEGATIVE 06/12/2020 1716   BILIRUBINUR NEGATIVE 06/12/2020 1716   KETONESUR 20 (A) 06/12/2020 1716   PROTEINUR NEGATIVE 06/12/2020 1716   NITRITE NEGATIVE 06/12/2020 1716   LEUKOCYTESUR MODERATE (A) 06/12/2020 1716    Radiological Exams on Admission - Personally Reviewed: CT HEAD WO CONTRAST  Result Date: 06/20/2020 CLINICAL DATA:  Weakness. EXAM: CT HEAD WITHOUT CONTRAST CT CERVICAL SPINE WITHOUT CONTRAST TECHNIQUE: Multidetector CT imaging of the head and cervical spine was performed following the standard protocol without intravenous contrast. Multiplanar CT image reconstructions of the cervical spine were also generated. COMPARISON:  None. FINDINGS: CT HEAD FINDINGS Brain: Mild diffuse cortical atrophy is noted. Mild chronic ischemic Lipscomb matter disease is noted. No mass effect or midline shift is noted. Ventricular size is within normal limits. There is no evidence of mass lesion, hemorrhage or acute infarction. Vascular: No hyperdense vessel or unexpected calcification. Skull: Normal. Negative for fracture or focal lesion. Sinuses/Orbits: No acute finding. Other: None. CT CERVICAL SPINE FINDINGS Alignment: Minimal grade 1 anterolisthesis of C4-5 is noted secondary to posterior facet joint hypertrophy. Skull base and vertebrae: No acute fracture. No primary bone lesion or focal pathologic process. Soft tissues and spinal canal: No prevertebral fluid or swelling. No visible canal hematoma. Disc levels: Severe degenerative disc disease is noted at C5-6. Moderate degenerative disc disease is noted at  C6-7. Upper chest: Negative. Other: None. IMPRESSION: 1. Mild diffuse cortical atrophy. Mild chronic ischemic Bruno matter disease. No acute intracranial abnormality seen. 2. Multilevel degenerative disc disease. No acute abnormality seen in the cervical spine. Electronically Signed   By: Marijo Conception M.D.   On: 06/20/2020 17:24   CT Chest Wo Contrast  Result Date: 06/20/2020 CLINICAL DATA:  Weakness. EXAM: CT CHEST WITHOUT CONTRAST TECHNIQUE: Multidetector CT imaging of the chest was performed following the standard protocol without IV contrast. COMPARISON:  June 12, 2020 FINDINGS: Cardiovascular: There is moderate severity calcification of the aortic arch and descending thoracic aorta, without evidence of aneurysmal dilatation. Normal heart size. No pericardial effusion. Mediastinum/Nodes: No enlarged mediastinal or axillary lymph nodes. Thyroid  gland, trachea, and esophagus demonstrate no significant findings. Lungs/Pleura: Centrilobular emphysematous lung disease is seen, bilaterally. A stable 4 mm calcified lung nodule is seen within the posterior aspect of the left upper lobe. Marked severity peripheral, patchy airspace disease is again seen within the bilateral upper lobes and bilateral lower lobes. This is increased in severity when compared to the prior study. A 3.3 cm x 2.4 cm ill-defined mass-like area of airspace disease is seen within the right lung base. There is a very small right pleural effusion. No pneumothorax is identified. Upper Abdomen: Subcentimeter gallstones are seen within a contracted gallbladder. Musculoskeletal: Degenerative changes are seen throughout the thoracic spine. IMPRESSION: 1. Marked severity bilateral peripheral infiltrates, increased in severity when compared to the prior study. 2. Area of right lower lobe mass-like consolidation which may represent an area of pneumonia. Follow-up to resolution is recommended, as an underlying neoplastic process cannot be excluded. 3.  Very small right pleural effusion. 4. Cholelithiasis. 5. Emphysema and aortic atherosclerosis. Aortic Atherosclerosis (ICD10-I70.0) and Emphysema (ICD10-J43.9). Electronically Signed   By: Virgina Norfolk M.D.   On: 06/20/2020 17:25   CT CERVICAL SPINE WO CONTRAST  Result Date: 06/20/2020 CLINICAL DATA:  Weakness. EXAM: CT HEAD WITHOUT CONTRAST CT CERVICAL SPINE WITHOUT CONTRAST TECHNIQUE: Multidetector CT imaging of the head and cervical spine was performed following the standard protocol without intravenous contrast. Multiplanar CT image reconstructions of the cervical spine were also generated. COMPARISON:  None. FINDINGS: CT HEAD FINDINGS Brain: Mild diffuse cortical atrophy is noted. Mild chronic ischemic Magar matter disease is noted. No mass effect or midline shift is noted. Ventricular size is within normal limits. There is no evidence of mass lesion, hemorrhage or acute infarction. Vascular: No hyperdense vessel or unexpected calcification. Skull: Normal. Negative for fracture or focal lesion. Sinuses/Orbits: No acute finding. Other: None. CT CERVICAL SPINE FINDINGS Alignment: Minimal grade 1 anterolisthesis of C4-5 is noted secondary to posterior facet joint hypertrophy. Skull base and vertebrae: No acute fracture. No primary bone lesion or focal pathologic process. Soft tissues and spinal canal: No prevertebral fluid or swelling. No visible canal hematoma. Disc levels: Severe degenerative disc disease is noted at C5-6. Moderate degenerative disc disease is noted at C6-7. Upper chest: Negative. Other: None. IMPRESSION: 1. Mild diffuse cortical atrophy. Mild chronic ischemic F[place]ley matter disease. No acute intracranial abnormality seen. 2. Multilevel degenerative disc disease. No acute abnormality seen in the cervical spine. Electronically Signed   By: Marijo Conception M.D.   On: 06/20/2020 17:24   DG Chest Port 1 View  Result Date: 06/20/2020 CLINICAL DATA:  Weakness. EXAM: PORTABLE CHEST 1 VIEW  COMPARISON:  January 01, 2016 FINDINGS: The lungs are hyperinflated. Emphysematous lung disease is seen with chronic appearing increased interstitial lung markings. There is no evidence of focal consolidation, pleural effusion or pneumothorax. The heart size and mediastinal contours are within normal limits. There is mild calcification of the aortic arch. The visualized skeletal structures are unremarkable. IMPRESSION: COPD without acute or active cardiopulmonary disease. Electronically Signed   By: Virgina Norfolk M.D.   On: 06/20/2020 16:21    EKG: Personally reviewed.  Rhythm is normal sinus rhythm with heart rate of 70 bpm.  Right bundle branch block noted.  No dynamic ST segment changes appreciated.  Assessment/Plan Principal Problem:   Acute hypoxemic respiratory failure due to COVID-19 [facility])   Patient exhibiting evidence of acute hypoxic respiratory failure presenting saturating in the 80s on room air now requiring 6 L of oxygen via  nasal cannula to G of oxygen saturations of 94%.    COVID-19 PCR testing positive which is the etiology of the patient's significant bilateral peripheral infiltrates noted on CT imaging  Patient is at high risk of rapid clinical decompensation due to suspected concurrent bronchogenic pneumonia  Initiating intravenous remdesivir and dexamethasone.  If patient continues to clinically deteriorate and have increasing oxygen requirements then tocilizumab or baricitinib will need to be initiated.  Providing patient with as needed bronchodilator therapy for shortness breath and wheezing  Provide patient with as needed antitussives for cough  Providing patient with zinc and vitamin C supplementation  Placing patient in progressive care unit  Contact and airborne precautions  Active Problems:   Acute metabolic encephalopathy   Patient exhibiting progressively worsening confusion and agitation throughout the evening  This is likely secondary to  worsening encephalopathy secondary to COVID-19 infection  Treating underlying COVID-19 infection as noted above  Otherwise, supportive care  Encouraging family to remain at bedside is much as possible    Essential hypertension  . Resume patients home regimen or oral antihypertensives . Titrate antihypertensive regimen as necessary to achieve adequate BP control . PRN intravenous antihypertensives for excessively elevated blood pressure    Acute urinary retention   Patient's 10-day history of worsening lower abdominal pain and difficulty with urination was likely not secondary to urinary tract infection but secondary to ongoing urinary retention hence the reason why she did not improve with a course of antibiotic  Intermittent urinary catheterization has been performed here in the emergency department revealing 850 cc of immediate urine output.  Due to patient being significantly confused in the emergency department will not leave an indwelling Foley catheter but will continue to monitor closely and if patient exhibits urinary retention again will repeat catheterization.    Bronchogenic cancer of right lung [facility])   Identified via CT imaging in December  Following with Dr. Valeta Harms in neurology clinic  Patient has yet to receive a tissue diagnosis although pulmonary feels that this is most likely bronchogenic carcinoma  Outpatient follow-up at time of discharge    COPD with emphysema ([place])    Significant evidence of emphysema on CT imaging  No clinical evidence of COPD exacerbation  As needed bronchodilator therapy for shortness breath and wheezing.   Code Status:  Full code Family Communication: Daughter is at bedside who has been updated on plan of care  Status is: Observation  The patient remains OBS appropriate and will d/c before 2 midnights.  Dispo: The patient is from: Home              Anticipated d/c is to: Home              Patient currently is not medically  stable to d/c.   Difficult to place patient No        Vernelle Emerald MD [facility] Pager [phone]  If 7PM-7AM, please contact night-coverage [email or web address] Use universal [place] password for that web site. If you do not have the password, please call the hospital operator.  06/21/2020, 2:09 AM

## 2020-06-21 NOTE — ED Notes (Signed)
Secure message sent to   [facility]

## 2020-06-21 NOTE — ED Notes (Signed)
Daughter [name] is going home to take shower and will be back [name] [phone]

## 2020-06-22 DIAGNOSIS — U071 COVID-19: Secondary | ICD-10-CM | POA: Diagnosis not present

## 2020-06-22 DIAGNOSIS — J9600 Acute respiratory failure, unspecified whether with hypoxia or hypercapnia: Secondary | ICD-10-CM | POA: Diagnosis not present

## 2020-06-22 LAB — CBC WITH DIFFERENTIAL/PLATELET
Abs Immature Granulocytes: 0.08 10*3/uL — ABNORMAL HIGH (ref 0.00–0.07)
Basophils Absolute: 0 10*3/uL (ref 0.0–0.1)
Basophils Relative: 0 %
Eosinophils Absolute: 0 10*3/uL (ref 0.0–0.5)
Eosinophils Relative: 0 %
HCT: 41.7 % (ref 36.0–46.0)
Hemoglobin: 13.9 g/dL (ref 12.0–15.0)
Immature Granulocytes: 2 %
Lymphocytes Relative: 11 %
Lymphs Abs: 0.5 10*3/uL — ABNORMAL LOW (ref 0.7–4.0)
MCH: 29.8 pg (ref 26.0–34.0)
MCHC: 33.3 g/dL (ref 30.0–36.0)
MCV: 89.3 fL (ref 80.0–100.0)
Monocytes Absolute: 0.2 10*3/uL (ref 0.1–1.0)
Monocytes Relative: 4 %
Neutro Abs: 4.1 10*3/uL (ref 1.7–7.7)
Neutrophils Relative %: 83 %
Platelets: 347 10*3/uL (ref 150–400)
RBC: 4.67 MIL/uL (ref 3.87–5.11)
RDW: 13.9 % (ref 11.5–15.5)
WBC: 4.9 10*3/uL (ref 4.0–10.5)
nRBC: 0 % (ref 0.0–0.2)

## 2020-06-22 LAB — COMPREHENSIVE METABOLIC PANEL
ALT: 26 U/L (ref 0–44)
AST: 37 U/L (ref 15–41)
Albumin: 2.5 g/dL — ABNORMAL LOW (ref 3.5–5.0)
Alkaline Phosphatase: 69 U/L (ref 38–126)
Anion gap: 7 (ref 5–15)
BUN: 16 mg/dL (ref 8–23)
CO2: 24 mmol/L (ref 22–32)
Calcium: 7.7 mg/dL — ABNORMAL LOW (ref 8.9–10.3)
Chloride: 107 mmol/L (ref 98–111)
Creatinine, Ser: 0.46 mg/dL (ref 0.44–1.00)
GFR, Estimated: >60 mL/min (ref >60)
Glucose, Bld: 136 mg/dL — ABNORMAL HIGH (ref 70–99)
Potassium: 3.9 mmol/L (ref 3.5–5.1)
Sodium: 138 mmol/L (ref 135–145)
Total Bilirubin: 0.6 mg/dL (ref 0.3–1.2)
Total Protein: 5.2 g/dL — ABNORMAL LOW (ref 6.5–8.1)

## 2020-06-22 LAB — D-DIMER, QUANTITATIVE: D-Dimer, Quant: 3.76 ug/mL-FEU — ABNORMAL HIGH (ref 0.00–0.50)

## 2020-06-22 LAB — C-REACTIVE PROTEIN: CRP: 2 mg/dL — ABNORMAL HIGH (ref <1.0)

## 2020-06-22 LAB — MAGNESIUM: Magnesium: 2 mg/dL (ref 1.7–2.4)

## 2020-06-22 NOTE — Progress Notes (Signed)
PROGRESS NOTE    Melanie Walker  QVZ:[id number] DOB: 02-05-1931 DOA: 06/20/2020 PCP: Melanie Manes, MD    Brief Narrative:  Melanie Walker is an 87 year old female with past medical history significant for COPD, essential hypertension who presented to the ED with progressive generalized weakness, shortness of breath with multiple falls and confusion.  Patient is a poor historian from suspect encephalopathy and history obtained from both the patient and daughter who is present at bedside as well as discussions with emergency department staff.  Dyspnea worse with exertion and improved with rest.  Also patient with lower abdominal pain with difficulty urinating.  Recently diagnosed with a urinary tract infection and completed course of ciprofloxacin.  Although believe her symptoms are related to urinary retention.  In the ED, temperature 98.5 F, HR 72, RR 22, BP 156/78, SPO2 96% on 4 L nasal cannula (not oxygen dependent at baseline).  Sodium 132, potassium 3.8, chloride 100, CO2 23, glucose 88, BUN 15, creatinine 0.66, magnesium 2.1, AST 59, ALT 34, total bilirubin 1.5.  Lactic acid 1.0.  Procalcitonin less than 0.10.  WBC 3.5, hemoglobin 14.9, platelets 314.  Influenza A/B PCR negative.  SARS-CoV-2/Covid-19 PCR positive.  Chest x-ray with COPD without acute cardiopulmonary disease process.  CT head/C-spine without contrast with mild diffuse cortical atrophy and chronic ischemic Lagman matter disease without acute intracranial abnormality and multilevel degenerative disc disease with no acute abnormality in the cervical spine.  CT chest with marked severity bilateral peripheral infiltrates with area of right lower lobe masslike consolidation consistent with pneumonia versus atelectasis with small right pleural effusion.  Hospitalist service consulted for further evaluation of acute hypoxic respite failure secondary to Covid-19 viral pneumonia.   Assessment & Plan:   Principal Problem:   Acute respiratory  failure due to COVID-19 Melanie Walker) Active Problems:   Complex partial seizure (Melanie Walker)   Essential hypertension   Acute urinary retention   Acute metabolic encephalopathy   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HCC)   COPD with emphysema (HCC)   Acute hypoxic respiratory failure secondary to acute Covid-19 viral pneumonia during the ongoing Covid 19 Pandemic - POA Patient presents with several week history of progressive weakness, shortness of breath.  Patient is unvaccinated.  On arrival to the ED, patient with significant hypoxia with SPO2 in the 80s on room air.  Patient was found to have multifocal infiltrates on CT chest.  Covid-19 PCR positive.  --COVID test: + PCR 06/20/20 --CRP 2.0 --ddimer 3.76 --Remdesivir, plan 5-day course (Day #2/5) --Baricitinib 4mg  PO daily (Day #2/14 ) --Continue Solumedrol 47.5mg  IV BID --prone for 2-3hrs every 12hrs if able --Continue supplemental oxygen, titrate to maintain SPO2 greater than 88%, on 3L [place] w/ SpO2 95% --Combivent MDI 1 puff every 6 hours --Continue supportive care with vitamin C, zinc, Tylenol, antitussives  --Follow CBC, CMP, D-dimer, and CRP daily --Continue airborne/contact isolation precautions   The treatment plan and use of medications and known side effects were discussed with patient/family. Some of the medications used are based on case reports/anecdotal data.  All other medications being used in the management of COVID-19 based on limited study data.  Complete risks and long-term side effects are unknown, however in the best clinical judgment they seem to be of some benefit.  Patient wanted to proceed with treatment options provided.  Essential hypertension --Amlodipine 5 mg p.o. daily  COPD Lung nodules Not oxygen dependent at baseline.  Recently diagnosed with spiculated lung nodule right lower lobe measuring 2.6 x 2.6  cm, spiculated nodule medial right lower lobe measuring 1.6 x 1.5 cm without evidence of hilar/mediastinal  lymphadenopathy.  Is currently being followed by pulmonary outpatient, Dr. Valeta Walker.  Pending PET CT and CT super D chest without contrast. --Outpatient follow-up with pulmonology   DVT prophylaxis: Lovenox   Code Status: Full Code Family Communication: Attempted to update patient's daughter, Melanie Walker via telephone, went straight to voicemail.  Disposition Plan:  Level of care: Progressive Status is: Inpatient  Remains inpatient appropriate because:Ongoing diagnostic testing needed not appropriate for outpatient work up, Unsafe d/c plan, IV treatments appropriate due to intensity of illness or inability to take PO and Inpatient level of care appropriate due to severity of illness   Dispo: The patient is from: Home              Anticipated d/c is to: Home              Patient currently is not medically stable to d/c.   Difficult to place patient No  Consultants:   none  Procedures:   none  Antimicrobials:   none   Subjective: Patient seen and examined bedside, resting comfortably.  No family present.  Patient reports fatigue and weakness, otherwise no other concerns this morning.  Oxygen titrated down from 8 to 3 L this morning.  No other questions or concerns at this time.  Denies headache, no visual changes, no chest pain, no palpitations, no abdominal pain.  No acute concerns overnight per nursing staff.  Objective: Vitals:   06/22/20 0609 06/22/20 0700 06/22/20 0743 06/22/20 1100  BP:    130/67  Pulse:      Resp:      Temp: 97.6 F (36.4 C)     TempSrc: Oral     SpO2:  97% 96%   Weight:      Height:        Intake/Output Summary (Last 24 hours) at 06/22/2020 1300 Last data filed at 06/22/2020 0743 Gross per 24 hour  Intake -  Output 1469 ml  Net -1469 ml   Filed Weights   06/21/20 1514  Weight: 46.4 kg    Examination:  General exam: Appears calm and comfortable, ill in appearance Respiratory system: Clear to auscultation. Respiratory effort normal.  On 3 L  nasal cannula SPO2 95% Cardiovascular system: S1 & S2 heard, RRR. No JVD, murmurs, rubs, gallops or clicks. No pedal edema. Gastrointestinal system: Abdomen is nondistended, soft and nontender. No organomegaly or masses felt. Normal bowel sounds heard. Central nervous system: Alert and oriented. No focal neurological deficits. Extremities: Symmetric 5 x 5 power. Skin: No rashes, lesions or ulcers Psychiatry: Judgement and insight appear poor. Mood & affect appropriate.     Data Reviewed: I have personally reviewed following labs and imaging studies  CBC: Recent Labs  Lab 06/20/20 1600 06/22/20 0422  WBC 3.5* 4.9  NEUTROABS 2.4 4.1  HGB 14.9 13.9  HCT 45.1 41.7  MCV 89.7 89.3  PLT 314 269   Basic Metabolic Panel: Recent Labs  Lab 06/20/20 1600 06/22/20 0422  NA 132* 138  K 3.8 3.9  CL 100 107  CO2 23 24  GLUCOSE 88 136*  BUN 15 16  CREATININE 0.66 0.46  CALCIUM 8.2* 7.7*  MG 2.1 2.0   GFR: Estimated Creatinine Clearance: 33.8 mL/min (by C-G formula based on SCr of 0.46 mg/dL). Liver Function Tests: Recent Labs  Lab 06/20/20 1600 06/22/20 0422  AST 59* 37  ALT 34 26  ALKPHOS 87 69  BILITOT 1.5* 0.6  PROT 6.3* 5.2*  ALBUMIN 3.1* 2.5*   No results for input(s): LIPASE, AMYLASE in the last 168 hours. No results for input(s): AMMONIA in the last 168 hours. Coagulation Profile: Recent Labs  Lab 06/20/20 1600  INR 0.9   Cardiac Enzymes: No results for input(s): CKTOTAL, CKMB, CKMBINDEX, TROPONINI in the last 168 hours. BNP (last 3 results) No results for input(s): PROBNP in the last 8760 hours. HbA1C: No results for input(s): HGBA1C in the last 72 hours. CBG: Recent Labs  Lab 06/20/20 1720  GLUCAP 82   Lipid Profile: No results for input(s): CHOL, HDL, LDLCALC, TRIG, CHOLHDL, LDLDIRECT in the last 72 hours. Thyroid Function Tests: No results for input(s): TSH, T4TOTAL, FREET4, T3FREE, THYROIDAB in the last 72 hours. Anemia Panel: No results for  input(s): VITAMINB12, FOLATE, FERRITIN, TIBC, IRON, RETICCTPCT in the last 72 hours. Sepsis Labs: Recent Labs  Lab 06/20/20 1600  PROCALCITON <0.10  LATICACIDVEN 1.0    Recent Results (from the past 240 hour(s))  Urine Culture     Status: Abnormal   Collection Time: 06/12/20  5:16 PM   Specimen: Urine, Random  Result Value Ref Range Status   Specimen Description   Final    URINE, RANDOM Performed at [facility] [address], [place], [place] [address]    Special Requests   Final    NONE Performed at [facility], [place] [address], [place], [place] [address]    Culture MULTIPLE SPECIES PRESENT, SUGGEST RECOLLECTION (A)  Final   Report Status 06/14/2020 FINAL  Final  Blood Culture (routine x 2)     Status: None (Preliminary result)   Collection Time: 06/20/20  3:57 PM   Specimen: BLOOD  Result Value Ref Range Status   Specimen Description   Final    BLOOD SITE NOT SPECIFIED Performed at [facility] [address], [place], [place] [address]    Special Requests   Final    BOTTLES DRAWN AEROBIC AND ANAEROBIC Blood Culture adequate volume Performed at [facility] [address] [place], [place] [address]    Culture   Final    NO GROWTH 1 DAY Performed at [facility], [place] [address] [place],  [address]    Report Status PENDING  Incomplete  Resp Panel by RT-PCR (Flu A&B, Covid) Nasopharyngeal Swab     Status: Abnormal   Collection Time: 06/20/20  5:23 PM   Specimen: Nasopharyngeal Swab; Nasopharyngeal(NP) swabs in vial transport medium  Result Value Ref Range Status   SARS Coronavirus 2 by RT PCR POSITIVE (A) NEGATIVE Final    Comment: RESULT CALLED TO, READ BACK BY AND VERIFIED WITH: BROOKS B. 03.17.22 @ 2144 BY MECAIL J. (NOTE) SARS-CoV-2 target nucleic acids are DETECTED.  The SARS-CoV-2 RNA is generally detectable in upper respiratory specimens during the acute phase of infection.  Positive results are indicative of the presence of the identified virus, but do not rule out bacterial infection or co-infection with other pathogens not detected by the test. Clinical correlation with patient history and other diagnostic information is necessary to determine patient infection status. The expected result is Negative.  Fact Sheet for Patients: EntrepreneurPulse.com.au  Fact Sheet for Healthcare Providers: IncredibleEmployment.be  This test is not yet approved or cleared by the [place] FDA and  has been authorized for detection and/or diagnosis of SARS-CoV-2 by FDA under an Emergency Use Authorization (EUA).  This EUA will remain in  effect (meaning this test c an be used) for the duration of  the COVID-19 declaration under Section 564(b)(1) of the Act, 21 U.S.C. section 360bbb-3(b)(1), unless the authorization is terminated or revoked sooner.     Influenza A by PCR NEGATIVE NEGATIVE Final   Influenza B by PCR NEGATIVE NEGATIVE Final    Comment: (NOTE) The Xpert Xpress SARS-CoV-2/FLU/RSV plus assay is intended as an aid in the diagnosis of influenza from Nasopharyngeal swab specimens and should not be used as a sole basis for treatment. Nasal washings and aspirates are unacceptable for Xpert Xpress SARS-CoV-2/FLU/RSV testing.  Fact Sheet for Patients: EntrepreneurPulse.com.au  Fact Sheet for Healthcare Providers: IncredibleEmployment.be  This test is not yet approved or cleared by the [place] FDA and has been authorized for detection and/or diagnosis of SARS-CoV-2 by FDA under an Emergency Use Authorization (EUA). This EUA will remain in effect (meaning this test can be used) for the duration of the COVID-19 declaration under Section 564(b)(1) of the Act, 21 U.S.C. section 360bbb-3(b)(1), unless the authorization is terminated or revoked.  Performed at [facility], [place] [address] [place], [place] [address]   Blood Culture (routine x 2)     Status: None (Preliminary result)   Collection Time: 06/20/20  8:25 PM   Specimen: BLOOD RIGHT HAND  Result Value Ref Range Status   Specimen Description   Final    BLOOD RIGHT HAND Performed at [facility] [address] [place], [place] [address]    Special Requests   Final    BOTTLES DRAWN AEROBIC AND ANAEROBIC Blood Culture results may not be optimal due to an inadequate volume of blood received in culture bottles Performed at [facility] [address] [place], [place] [address]    Culture   Final    NO GROWTH 1 DAY Performed at [facility], [place] [address] [place], [place] [address]    Report Status PENDING  Incomplete         Radiology Studies: CT HEAD WO CONTRAST  Result Date: 06/20/2020 CLINICAL DATA:  Weakness. EXAM: CT HEAD WITHOUT CONTRAST CT CERVICAL SPINE WITHOUT CONTRAST TECHNIQUE: Multidetector CT imaging of the head and cervical spine was performed following the standard protocol without intravenous contrast. Multiplanar CT image reconstructions of the cervical spine were also generated. COMPARISON:  None. FINDINGS: CT HEAD FINDINGS Brain: Mild diffuse cortical atrophy is noted. Mild chronic ischemic Deberry matter disease is noted. No mass effect or midline shift is noted. Ventricular size is within normal limits. There is no evidence of mass lesion, hemorrhage or acute infarction. Vascular: No hyperdense vessel or unexpected calcification. Skull: Normal. Negative for fracture or focal lesion. Sinuses/Orbits: No acute finding. Other: None. CT CERVICAL SPINE FINDINGS Alignment: Minimal grade 1 anterolisthesis of C4-5 is noted secondary to posterior facet joint hypertrophy. Skull base and vertebrae: No acute fracture. No primary bone lesion or focal pathologic process. Soft tissues and spinal canal: No prevertebral fluid or swelling. No  visible canal hematoma. Disc levels: Severe degenerative disc disease is noted at C5-6. Moderate degenerative disc disease is noted at C6-7. Upper chest: Negative. Other: None. IMPRESSION: 1. Mild diffuse cortical atrophy. Mild chronic ischemic [place]illern matter disease. No acute intracranial abnormality seen. 2. Multilevel degenerative disc disease. No acute abnormality seen in the cervical spine. Electronically Signed   By: Marijo Conception M.D.   On: 06/20/2020 17:24   CT Chest Wo Contrast  Result Date: 06/20/2020 CLINICAL DATA:  Weakness. EXAM: CT CHEST WITHOUT CONTRAST TECHNIQUE: Multidetector CT imaging of the chest was performed following the standard protocol without IV contrast. COMPARISON:  June 12, 2020 FINDINGS: Cardiovascular: There is moderate severity calcification of the aortic arch and descending thoracic aorta, without evidence of aneurysmal dilatation. Normal heart size. No pericardial effusion. Mediastinum/Nodes: No enlarged mediastinal or axillary lymph nodes. Thyroid gland, trachea, and esophagus demonstrate no significant findings. Lungs/Pleura: Centrilobular emphysematous lung disease is seen, bilaterally. A stable 4 mm calcified lung nodule is seen within the posterior aspect of the left upper lobe. Marked severity peripheral, patchy airspace disease is again seen within the bilateral upper lobes and bilateral lower lobes. This is increased in severity when compared to the prior study. A 3.3 cm x 2.4 cm ill-defined mass-like area of airspace disease is seen within the right lung base. There is a very small right pleural effusion. No pneumothorax is identified. Upper Abdomen: Subcentimeter gallstones are seen within a contracted gallbladder. Musculoskeletal: Degenerative changes are seen throughout the thoracic spine. IMPRESSION: 1. Marked severity bilateral peripheral infiltrates, increased in severity when compared to the prior study. 2. Area of right lower lobe mass-like consolidation which  may represent an area of pneumonia. Follow-up to resolution is recommended, as an underlying neoplastic process cannot be excluded. 3. Very small right pleural effusion. 4. Cholelithiasis. 5. Emphysema and aortic atherosclerosis. Aortic Atherosclerosis (ICD10-I70.0) and Emphysema (ICD10-J43.9). Electronically Signed   By: Virgina Norfolk M.D.   On: 06/20/2020 17:25   CT CERVICAL SPINE WO CONTRAST  Result Date: 06/20/2020 CLINICAL DATA:  Weakness. EXAM: CT HEAD WITHOUT CONTRAST CT CERVICAL SPINE WITHOUT CONTRAST TECHNIQUE: Multidetector CT imaging of the head and cervical spine was performed following the standard protocol without intravenous contrast. Multiplanar CT image reconstructions of the cervical spine were also generated. COMPARISON:  None. FINDINGS: CT HEAD FINDINGS Brain: Mild diffuse cortical atrophy is noted. Mild chronic ischemic Kempfer matter disease is noted. No mass effect or midline shift is noted. Ventricular size is within normal limits. There is no evidence of mass lesion, hemorrhage or acute infarction. Vascular: No hyperdense vessel or unexpected calcification. Skull: Normal. Negative for fracture or focal lesion. Sinuses/Orbits: No acute finding. Other: None. CT CERVICAL SPINE FINDINGS Alignment: Minimal grade 1 anterolisthesis of C4-5 is noted secondary to posterior facet joint hypertrophy. Skull base and vertebrae: No acute fracture. No primary bone lesion or focal pathologic process. Soft tissues and spinal canal: No prevertebral fluid or swelling. No visible canal hematoma. Disc levels: Severe degenerative disc disease is noted at C5-6. Moderate degenerative disc disease is noted at C6-7. Upper chest: Negative. Other: None. IMPRESSION: 1. Mild diffuse cortical atrophy. Mild chronic ischemic Kutzer matter disease. No acute intracranial abnormality seen. 2. Multilevel degenerative disc disease. No acute abnormality seen in the cervical spine. Electronically Signed   By: Marijo Conception  M.D.   On: 06/20/2020 17:24   DG Chest Port 1 View  Result Date: 06/20/2020 CLINICAL DATA:  Weakness. EXAM: PORTABLE CHEST 1 VIEW COMPARISON:  January 01, 2016 FINDINGS: The lungs are hyperinflated. Emphysematous lung disease is seen with chronic appearing increased interstitial lung markings. There is no evidence of focal consolidation, pleural effusion or pneumothorax. The heart size and mediastinal contours are within normal limits. There is mild calcification of the aortic arch. The visualized skeletal structures are unremarkable. IMPRESSION: COPD without acute or active cardiopulmonary disease. Electronically Signed   By: Virgina Norfolk M.D.   On: 06/20/2020 16:21        Scheduled  Meds: . amLODipine  5 mg Oral Daily  . vitamin C  500 mg Oral Daily  . baricitinib  2 mg Oral Daily  . enoxaparin (LOVENOX) injection  40 mg Subcutaneous Q24H  . Ipratropium-Albuterol  1 puff Inhalation Q6H  . loratadine  10 mg Oral Daily  . methylPREDNISolone (SOLU-MEDROL) injection  1 mg/kg Intravenous Q12H   Followed by  . [START ON 06/24/2020] predniSONE  50 mg Oral Daily  . zinc sulfate  220 mg Oral Daily   Continuous Infusions: . remdesivir 100 mg in NS 100 mL 100 mg (06/22/20 1008)     LOS: 1 day    Time spent: 39 minutes spent on chart review, discussion with nursing staff, consultants, updating family and interview/physical exam; more than 50% of that time was spent in counseling and/or coordination of care.    Eric J [place] DO [facility] Available via Epic secure chat 7am-7pm After these hours, please refer to coverage provider listed on [email or web address] 06/22/2020, 1:00 PM

## 2020-06-22 NOTE — Plan of Care (Signed)
  Problem: Education: Goal: Knowledge of General Education information will improve Description: Including pain rating scale, medication(s)/side effects and non-pharmacologic comfort measures Outcome: Progressing   Problem: Coping: Goal: Level of anxiety will decrease Outcome: Progressing   Problem: Safety: Goal: Ability to remain free from injury will improve Outcome: Progressing   

## 2020-06-22 NOTE — Plan of Care (Signed)
Patient weaned from 8 liters to 2 liters on 7 a to 7 p shift, maintains oxygen saturation in the mid 90's until up to bedside commode when she will desat into the mid 80s.  Bladder scan performed after each urination, some retention noted (less than 200 mls each time).  Daughter does state that mother was having difficulty emptying bladder prior to admission.  Patient up with 1 max assist to [facility], very fearful of falling.  Daughter at bedside for majority of shift.

## 2020-06-23 DIAGNOSIS — J9600 Acute respiratory failure, unspecified whether with hypoxia or hypercapnia: Secondary | ICD-10-CM | POA: Diagnosis not present

## 2020-06-23 DIAGNOSIS — U071 COVID-19: Secondary | ICD-10-CM | POA: Diagnosis not present

## 2020-06-23 LAB — CBC WITH DIFFERENTIAL/PLATELET
Abs Immature Granulocytes: 0.12 10*3/uL — ABNORMAL HIGH (ref 0.00–0.07)
Basophils Absolute: 0 10*3/uL (ref 0.0–0.1)
Basophils Relative: 0 %
Eosinophils Absolute: 0 10*3/uL (ref 0.0–0.5)
Eosinophils Relative: 0 %
HCT: 43.2 % (ref 36.0–46.0)
Hemoglobin: 14.1 g/dL (ref 12.0–15.0)
Immature Granulocytes: 2 %
Lymphocytes Relative: 12 %
Lymphs Abs: 0.7 10*3/uL (ref 0.7–4.0)
MCH: 29.1 pg (ref 26.0–34.0)
MCHC: 32.6 g/dL (ref 30.0–36.0)
MCV: 89.3 fL (ref 80.0–100.0)
Monocytes Absolute: 0.3 10*3/uL (ref 0.1–1.0)
Monocytes Relative: 5 %
Neutro Abs: 5 10*3/uL (ref 1.7–7.7)
Neutrophils Relative %: 81 %
Platelets: 411 10*3/uL — ABNORMAL HIGH (ref 150–400)
RBC: 4.84 MIL/uL (ref 3.87–5.11)
RDW: 13.8 % (ref 11.5–15.5)
WBC: 6.2 10*3/uL (ref 4.0–10.5)
nRBC: 0 % (ref 0.0–0.2)

## 2020-06-23 LAB — COMPREHENSIVE METABOLIC PANEL
ALT: 26 U/L (ref 0–44)
AST: 36 U/L (ref 15–41)
Albumin: 2.5 g/dL — ABNORMAL LOW (ref 3.5–5.0)
Alkaline Phosphatase: 67 U/L (ref 38–126)
Anion gap: 9 (ref 5–15)
BUN: 18 mg/dL (ref 8–23)
CO2: 25 mmol/L (ref 22–32)
Calcium: 8 mg/dL — ABNORMAL LOW (ref 8.9–10.3)
Chloride: 104 mmol/L (ref 98–111)
Creatinine, Ser: 0.47 mg/dL (ref 0.44–1.00)
GFR, Estimated: >60 mL/min (ref >60)
Glucose, Bld: 141 mg/dL — ABNORMAL HIGH (ref 70–99)
Potassium: 3.8 mmol/L (ref 3.5–5.1)
Sodium: 138 mmol/L (ref 135–145)
Total Bilirubin: 0.7 mg/dL (ref 0.3–1.2)
Total Protein: 5.1 g/dL — ABNORMAL LOW (ref 6.5–8.1)

## 2020-06-23 LAB — C-REACTIVE PROTEIN: CRP: 0.9 mg/dL (ref <1.0)

## 2020-06-23 LAB — MAGNESIUM: Magnesium: 2.2 mg/dL (ref 1.7–2.4)

## 2020-06-23 LAB — D-DIMER, QUANTITATIVE: D-Dimer, Quant: 2.65 ug/mL-FEU — ABNORMAL HIGH (ref 0.00–0.50)

## 2020-06-23 MED ORDER — IPRATROPIUM-ALBUTEROL 20-100 MCG/ACT IN AERS
1.0000 | INHALATION_SPRAY | Freq: Two times a day (BID) | RESPIRATORY_TRACT | Status: DC
  Administered 2020-06-24 – 2020-06-26 (×5): 1 via RESPIRATORY_TRACT
  Filled 2020-06-23: qty 4

## 2020-06-23 MED ORDER — BARICITINIB 2 MG PO TABS
2.0000 mg | ORAL_TABLET | Freq: Once | ORAL | Status: AC
  Administered 2020-06-23: 2 mg via ORAL
  Filled 2020-06-23: qty 1

## 2020-06-23 MED ORDER — IPRATROPIUM-ALBUTEROL 20-100 MCG/ACT IN AERS
1.0000 | INHALATION_SPRAY | Freq: Four times a day (QID) | RESPIRATORY_TRACT | Status: DC | PRN
  Filled 2020-06-23: qty 4

## 2020-06-23 MED ORDER — BARICITINIB 2 MG PO TABS
4.0000 mg | ORAL_TABLET | Freq: Every day | ORAL | Status: DC
  Administered 2020-06-24 – 2020-07-02 (×9): 4 mg via ORAL
  Filled 2020-06-23 (×9): qty 2

## 2020-06-23 NOTE — Plan of Care (Signed)

## 2020-06-23 NOTE — Evaluation (Signed)
Occupational Therapy Evaluation Patient Details Name: Melanie Walker MRN: [id number] DOB: [birth year] Today's Date: 06/23/2020    History of Present Illness 87 year old female with past medical history significant for COPD, essential hypertension who presented to the ED with progressive generalized weakness, shortness of breath with multiple falls and confusion.  Patient is a poor historian from suspect encephalopathy and history obtained from both the patient and daughter who is present at bedside as well as discussions with emergency department staff.  Dyspnea worse with exertion and improved with rest.  Also patient with lower abdominal pain with difficulty urinating.  Recently diagnosed with a urinary tract infection and completed course of ciprofloxacin.  Although believe her symptoms are related to urinary retention.  Pt tested positive for COVID.   Clinical Impression   Patient is currently requiring assistance with ADLs including moderate assist with toileting, minimal assist with LE dressing, moderate to maximum assist with bathing, and minimal assist with UE dressing and grooming, all of which is below patient's typical baseline of being Modified independent.  During this evaluation, patient was limited by anxiety/fear of falling which is decreasing her ability to follow instructions for safety, impaired activity tolerance with desat to 84% on 2L via [place] during UE testing, chronic LE pain from neuropathy, and decreased standing balance, all of which has the potential to impact patient's safety and independence during functional mobility, as well as performance for ADLs. [place] "6-clicks" Daily Activity Inpatient Short Form score of 16/24 indicates 53.32% ADL impairment this session. Patient lives alone with PRN supervision and assistance from her daughter at baseline.  Patient demonstrates good rehab potential, and should benefit from continued skilled occupational therapy services  while in acute care to maximize safety, independence and quality of life at home.  Continued occupational therapy services in a SNF setting prior to return home is recommended.  ?    Follow Up Recommendations  SNF    Equipment Recommendations       Recommendations for Other Services       Precautions / Restrictions Precautions Precautions: Fall Precaution Comments: Airborn/Contact, Mon O2 Restrictions Weight Bearing Restrictions: No      Mobility Bed Mobility Overal bed mobility: Needs Assistance Bed Mobility: Supine to Sit     Supine to sit: Min guard          Transfers Overall transfer level: Needs assistance Equipment used: Rolling walker (2 wheeled) Transfers: Sit to/from [facility] Sit to Stand: Min assist;From elevated surface Stand pivot transfers: Min assist       General transfer comment: Pt insisted on having floor mat under her feet before agreeing to stand and pivot back to bed.  Pt placed feet anteriorly trying to reach feet with mat because she believes floor to be too slippery. Pt with decreased awareness of fall risk activities and decreased ability to follow instructions for safety possibly due to anxiety from recent falls.  With stand to sit, pt cues to reach back for bed rail, but not following.    Balance Overall balance assessment: Needs assistance   Sitting balance-Leahy Scale: Fair Sitting balance - Comments: Briefly at EOB and unable to make good sitting balance determination.     Standing balance-Leahy Scale: Poor Standing balance comment: Unsteady with need of BUE support on RW.                           ADL either performed or assessed with clinical  judgement   ADL Overall ADL's : Needs assistance/impaired Eating/Feeding: Supervision/ safety;Set up Eating/Feeding Details (indicate cue type and reason): Noted hand tremor when holding cup to mouth. Daughter reports that she has observed the tremor during  tooth brushing very recently. Grooming: Min guard;Sitting;Set up Grooming Details (indicate cue type and reason): Min guard and full setup due to new onset tremor and need of cues for pacing/breathing. Upper Body Bathing: Minimal assistance;Sitting   Lower Body Bathing: Moderate assistance;Sit to/from stand;Sitting/lateral leans   Upper Body Dressing : Minimal assistance;Sitting   Lower Body Dressing: Minimal assistance;Sitting/lateral leans;Sit to/from stand Lower Body Dressing Details (indicate cue type and reason): Pt able to don her pull on slippers with setup and cues after to rest and breath to bring SpO2 back up. Desat to 87% Toilet Transfer: Conservation officer, nature Details (indicate cue type and reason): Pt used RW and required Min As to safely pivot. Please also see Mobility section. 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 Pain Intervention(s): Limited activity within patient's tolerance;Monitored during session;Repositioned (Pt's slippers donned to cushion feet.)     Hand Dominance Right   Extremity/Trunk Assessment Upper Extremity Assessment Upper Extremity Assessment: Overall WFL for tasks assessed   Lower Extremity Assessment Lower Extremity Assessment: Defer to PT evaluation   Cervical / Trunk Assessment Cervical / Trunk Assessment: Normal   *Pt desaturated to 87% during MMT, and began cues to rest and begin pursed lip breathing. Pt able to demo PLB back with multimodal cues including  continued demonstration. Pt continued to desat to 84% during rest in chair, then returned to 88% and 90% after ~42min of breathing.  Pt/daughter educated on importance of pacing selves and resting before overly tired. Pt and daughter were oriented to pulse ox and O2 sats for biofeedback purposes and to know when to stop, rest and breath.     Communication Communication Communication: No difficulties   Cognition Arousal/Alertness: Awake/alert Behavior During Therapy: Anxious;Flat affect Overall Cognitive Status: Impaired/Different from baseline Area of Impairment: Safety/judgement                         Safety/Judgement: Decreased awareness of safety         General Comments       Exercises     Shoulder Instructions      Home Living Family/patient expects to be discharged to:: Skilled nursing facility Living Arrangements: Alone Available Help at Discharge: Family;Available PRN/intermittently (Family is working on it.) Type of Home: House Home Access: Stairs to enter Technical brewer of Steps: 1 + 1 Entrance Stairs-Rails: None Home Layout: One level     Bathroom Shower/Tub: Occupational psychologist: Standard Bathroom Accessibility: Yes How Accessible: Accessible via walker Home Equipment: Grab bars - tub/shower;Cane - single point;Walker - standard;Cane - quad;Shower seat;Adaptive equipment Adaptive Equipment: Reacher;Long-handled sponge        Prior Functioning/Environment Level of Independence: Needs assistance  Gait / Transfers Assistance Needed: Uses standard walker as needed for foot/LE pain and balance. ADL's / Homemaking Assistance Needed: Daughter  provides transporation.  Pt has a regular cleaning lady. Daughter brings groceries and performs some cooking.            OT Problem List: Impaired balance (sitting and/or standing);Decreased knowledge of use of DME or AE;Decreased knowledge of precautions;Impaired UE functional  use;Decreased safety awareness;Decreased activity tolerance;Cardiopulmonary status limiting activity;Pain      OT Treatment/Interventions: Self-care/ADL training;Therapeutic exercise;Therapeutic activities;Cognitive remediation/compensation;Energy conservation;DME and/or AE instruction;Patient/family education;Balance training    OT Goals(Current goals can be found in the care plan section) Acute Rehab OT Goals Patient Stated Goal: Daughter: For pt to go to Rehab. OT Goal Formulation: With patient/family Time For Goal Achievement: 07/07/20 Potential to Achieve Goals: Good ADL Goals Pt Will Perform Lower Body Bathing: with adaptive equipment;sitting/lateral leans;with supervision Pt Will Perform Lower Body Dressing: with adaptive equipment;with supervision;sitting/lateral leans;sit to/from stand Pt Will Transfer to Toilet: with modified independence;ambulating;bedside commode Pt Will Perform Toileting - Clothing Manipulation and hygiene: with modified independence;with adaptive equipment;sitting/lateral leans;sit to/from stand Additional ADL Goal #1: Pt or daughter will identify 3 energy conservation strategies that pt can employ during ADLs and functional mobility to avoid relapse and rehospitalization, including demonstrating pursed lip breathing correctly without need of cues.  OT Frequency: Min 2X/week   Barriers to D/C:    Lives alone.  Daughter with multiple family stressors and working on increasing available supervision, but unsure what can be provided at this time.       Co-evaluation              AM-PAC OT "6 Clicks" Daily Activity     Outcome Measure Help from another person eating meals?: A Little Help from another person taking care of personal grooming?: A Little Help from another person toileting, which includes using toliet, bedpan, or urinal?: A Lot Help from another person bathing (including washing, rinsing, drying)?: A Lot Help from another person to put on and  taking off regular upper body clothing?: A Little Help from another person to put on and taking off regular lower body clothing?: A Little 6 Click Score: 16   End of Session Equipment Utilized During Treatment: Rolling walker;Oxygen Nurse Communication: Mobility status (O2 sats)  Activity Tolerance: Patient limited by fatigue;Patient limited by pain Patient left: in bed;with call bell/phone within reach;with bed alarm set;with family/visitor present  OT Visit Diagnosis: Unsteadiness on feet (R26.81);Pain;History of falling (Z91.81);Repeated falls (R29.6);Other symptoms and signs involving the nervous system (R29.898) Pain - Right/Left:  (Bilateral) Pain - part of body: Leg;Ankle and joints of foot                Time: 6213-0865 OT Time Calculation (min): 38 min Charges:  OT General Charges $OT Visit: 1 Visit OT Evaluation $OT Eval Moderate Complexity: 1 Mod OT Treatments $Self Care/Home Management : 8-22 mins  Anderson Malta, OT Acute Rehab Services Office: [phone] 06/23/2020  Julien Girt 06/23/2020, 3:25 PM

## 2020-06-23 NOTE — Progress Notes (Signed)
Bladder scan performed, patient with 298 mls after urinating 100 mls.  Patient with problems with urinary retention prior to admission.  Discussed this with daughter.  Will give patient a little time to rest then get patient back up to [facility] to see if more urine will drain and repeat bladder scan.  Protocol to in and out cath if greater than 350 mls retained.  Will continue to monitor.

## 2020-06-23 NOTE — Progress Notes (Signed)
PROGRESS NOTE    Melanie Walker  PIR:[id number] DOB: 01/02/1931 DOA: 06/20/2020 PCP: Lajean Manes, MD    Brief Narrative:  Melanie Walker is an 87 year old female with past medical history significant for COPD, essential hypertension who presented to the ED with progressive generalized weakness, shortness of breath with multiple falls and confusion.  Patient is a poor historian from suspect encephalopathy and history obtained from both the patient and daughter who is present at bedside as well as discussions with emergency department staff.  Dyspnea worse with exertion and improved with rest.  Also patient with lower abdominal pain with difficulty urinating.  Recently diagnosed with a urinary tract infection and completed course of ciprofloxacin.  Although believe her symptoms are related to urinary retention.  In the ED, temperature 98.5 F, HR 72, RR 22, BP 156/78, SPO2 96% on 4 L nasal cannula (not oxygen dependent at baseline).  Sodium 132, potassium 3.8, chloride 100, CO2 23, glucose 88, BUN 15, creatinine 0.66, magnesium 2.1, AST 59, ALT 34, total bilirubin 1.5.  Lactic acid 1.0.  Procalcitonin less than 0.10.  WBC 3.5, hemoglobin 14.9, platelets 314.  Influenza A/B PCR negative.  SARS-CoV-2/Covid-19 PCR positive.  Chest x-ray with COPD without acute cardiopulmonary disease process.  CT head/C-spine without contrast with mild diffuse cortical atrophy and chronic ischemic Azzarello matter disease without acute intracranial abnormality and multilevel degenerative disc disease with no acute abnormality in the cervical spine.  CT chest with marked severity bilateral peripheral infiltrates with area of right lower lobe masslike consolidation consistent with pneumonia versus atelectasis with small right pleural effusion.  Hospitalist service consulted for further evaluation of acute hypoxic respite failure secondary to Covid-19 viral pneumonia.   Assessment & Plan:   Principal Problem:   Acute respiratory  failure due to COVID-19 [facility]) Active Problems:   Complex partial seizure ([place])   Essential hypertension   Acute urinary retention   Acute metabolic encephalopathy   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HCC)   COPD with emphysema (HCC)   Acute hypoxic respiratory failure secondary to acute Covid-19 viral pneumonia during the ongoing Covid 19 Pandemic - POA Patient presents with several week history of progressive weakness, shortness of breath.  Patient is unvaccinated.  On arrival to the ED, patient with significant hypoxia with SPO2 in the 80s on room air.  Patient was found to have multifocal infiltrates on CT chest.  Covid-19 PCR positive.  --COVID test: + PCR 06/20/20 --CRP 2.0>0.9 --ddimer 3.76>2.65 --Remdesivir, plan 5-day course (Day #3/5) --Baricitinib 4mg  PO daily (Day #3/14 ) --Continue Solumedrol 47.5mg  IV BID --prone for 2-3hrs every 12hrs if able --Continue supplemental oxygen, titrate to maintain SPO2 greater than 88%, on 2L [place] w/ SpO2 95% --Combivent MDI 1 puff every 6 hours --Continue supportive care with vitamin C, zinc, Tylenol, antitussives  --Follow CBC, CMP, D-dimer, and CRP daily --Continue airborne/contact isolation precautions   The treatment plan and use of medications and known side effects were discussed with patient/family. Some of the medications used are based on case reports/anecdotal data.  All other medications being used in the management of COVID-19 based on limited study data.  Complete risks and long-term side effects are unknown, however in the best clinical judgment they seem to be of some benefit.  Patient wanted to proceed with treatment options provided.  Essential hypertension --Amlodipine 5 mg p.o. daily  COPD Lung nodules Not oxygen dependent at baseline.  Recently diagnosed with spiculated lung nodule right lower lobe measuring 2.6 x 2.6  cm, spiculated nodule medial right lower lobe measuring 1.6 x 1.5 cm without evidence of hilar/mediastinal  lymphadenopathy.  Is currently being followed by pulmonary outpatient, Dr. Valeta Harms.  Pending PET CT and CT super D chest without contrast. --Outpatient follow-up with pulmonology   DVT prophylaxis: Lovenox   Code Status: Full Code Family Communication: No family present at bedside this morning  Disposition Plan:  Level of care: Progressive Status is: Inpatient  Remains inpatient appropriate because:Ongoing diagnostic testing needed not appropriate for outpatient work up, Unsafe d/c plan, IV treatments appropriate due to intensity of illness or inability to take PO and Inpatient level of care appropriate due to severity of illness   Dispo: The patient is from: Home              Anticipated d/c is to: Home              Patient currently is not medically stable to d/c.   Difficult to place patient No  Consultants:   none  Procedures:   none  Antimicrobials:   none   Subjective: Patient seen and examined bedside, resting comfortably.  No family present.  Patient reports fatigue and weakness, otherwise no other concerns this morning.  Oxygen titrated down from 3 to 2 L this morning.  No other questions or concerns at this time.  Denies headache, no visual changes, no chest pain, no palpitations, no abdominal pain.  No acute concerns overnight per nursing staff.  Objective: Vitals:   06/22/20 1400 06/22/20 2100 06/23/20 0316 06/23/20 1231  BP:  108/66 (!) 161/76 118/65  Pulse:    64  Resp:   18 18  Temp:  98.6 F (37 C) (!) 97.3 F (36.3 C) 98.2 F (36.8 C)  TempSrc:  Oral Oral Oral  SpO2: 90%  93% 90%  Weight:      Height:        Intake/Output Summary (Last 24 hours) at 06/23/2020 1240 Last data filed at 06/23/2020 1000 Gross per 24 hour  Intake 580 ml  Output 1598 ml  Net -1018 ml   Filed Weights   06/21/20 1514  Weight: 46.4 kg    Examination:  General exam: Appears calm and comfortable, ill in appearance Respiratory system: Clear to auscultation.  Respiratory effort normal.  On 2 L nasal cannula SPO2 95% Cardiovascular system: S1 & S2 heard, RRR. No JVD, murmurs, rubs, gallops or clicks. No pedal edema. Gastrointestinal system: Abdomen is nondistended, soft and nontender. No organomegaly or masses felt. Normal bowel sounds heard. Central nervous system: Alert and oriented. No focal neurological deficits. Extremities: Symmetric 5 x 5 power. Skin: No rashes, lesions or ulcers Psychiatry: Judgement and insight appear poor. Mood & affect appropriate.     Data Reviewed: I have personally reviewed following labs and imaging studies  CBC: Recent Labs  Lab 06/20/20 1600 06/22/20 0422 06/23/20 0416  WBC 3.5* 4.9 6.2  NEUTROABS 2.4 4.1 5.0  HGB 14.9 13.9 14.1  HCT 45.1 41.7 43.2  MCV 89.7 89.3 89.3  PLT 314 347 735*   Basic Metabolic Panel: Recent Labs  Lab 06/20/20 1600 06/22/20 0422 06/23/20 0416  NA 132* 138 138  K 3.8 3.9 3.8  CL 100 107 104  CO2 23 24 25   GLUCOSE 88 136* 141*  BUN 15 16 18   CREATININE 0.66 0.46 0.47  CALCIUM 8.2* 7.7* 8.0*  MG 2.1 2.0 2.2   GFR: Estimated Creatinine Clearance: 33.8 mL/min (by C-G formula based on SCr of 0.47 mg/dL).  Liver Function Tests: Recent Labs  Lab 06/20/20 1600 06/22/20 0422 06/23/20 0416  AST 59* 37 36  ALT 34 26 26  ALKPHOS 87 69 67  BILITOT 1.5* 0.6 0.7  PROT 6.3* 5.2* 5.1*  ALBUMIN 3.1* 2.5* 2.5*   No results for input(s): LIPASE, AMYLASE in the last 168 hours. No results for input(s): AMMONIA in the last 168 hours. Coagulation Profile: Recent Labs  Lab 06/20/20 1600  INR 0.9   Cardiac Enzymes: No results for input(s): CKTOTAL, CKMB, CKMBINDEX, TROPONINI in the last 168 hours. BNP (last 3 results) No results for input(s): PROBNP in the last 8760 hours. HbA1C: No results for input(s): HGBA1C in the last 72 hours. CBG: Recent Labs  Lab 06/20/20 1720  GLUCAP 82   Lipid Profile: No results for input(s): CHOL, HDL, LDLCALC, TRIG, CHOLHDL, LDLDIRECT  in the last 72 hours. Thyroid Function Tests: No results for input(s): TSH, T4TOTAL, FREET4, T3FREE, THYROIDAB in the last 72 hours. Anemia Panel: No results for input(s): VITAMINB12, FOLATE, FERRITIN, TIBC, IRON, RETICCTPCT in the last 72 hours. Sepsis Labs: Recent Labs  Lab 06/20/20 1600  PROCALCITON <0.10  LATICACIDVEN 1.0    Recent Results (from the past 240 hour(s))  Blood Culture (routine x 2)     Status: None (Preliminary result)   Collection Time: 06/20/20  3:57 PM   Specimen: BLOOD  Result Value Ref Range Status   Specimen Description   Final    BLOOD SITE NOT SPECIFIED Performed at [facility], [address] [place], [place] [address]    Special Requests   Final    BOTTLES DRAWN AEROBIC AND ANAEROBIC Blood Culture adequate volume Performed at [facility] [address], [place], [place] [address]    Culture   Final    NO GROWTH 1 DAY Performed at [facility], [place] [address], [place], [place] [address]    Report Status PENDING  Incomplete  Resp Panel by RT-PCR (Flu A&B, Covid) Nasopharyngeal Swab     Status: Abnormal   Collection Time: 06/20/20  5:23 PM   Specimen: Nasopharyngeal Swab; Nasopharyngeal(NP) swabs in vial transport medium  Result Value Ref Range Status   SARS Coronavirus 2 by RT PCR POSITIVE (A) NEGATIVE Final    Comment: RESULT CALLED TO, READ BACK BY AND VERIFIED WITH: BROOKS B. 03.17.22 @ 2144 BY MECAIL J. (NOTE) SARS-CoV-2 target nucleic acids are DETECTED.  The SARS-CoV-2 RNA is generally detectable in upper respiratory specimens during the acute phase of infection. Positive results are indicative of the presence of the identified virus, but do not rule out bacterial infection or co-infection with other pathogens not detected by the test. Clinical correlation with patient history and other diagnostic information is necessary to determine patient infection status. The expected result is Negative.  Fact  Sheet for Patients: EntrepreneurPulse.com.au  Fact Sheet for Healthcare Providers: IncredibleEmployment.be  This test is not yet approved or cleared by the [place] FDA and  has been authorized for detection and/or diagnosis of SARS-CoV-2 by FDA under an Emergency Use Authorization (EUA).  This EUA will remain in effect (meaning this test c an be used) for the duration of  the COVID-19 declaration under Section 564(b)(1) of the Act, 21 U.S.C. section 360bbb-3(b)(1), unless the authorization is terminated or revoked sooner.     Influenza A by PCR NEGATIVE NEGATIVE Final   Influenza B by PCR NEGATIVE NEGATIVE Final    Comment: (NOTE) The Xpert Xpress SARS-CoV-2/FLU/RSV plus assay is  intended as an aid in the diagnosis of influenza from Nasopharyngeal swab specimens and should not be used as a sole basis for treatment. Nasal washings and aspirates are unacceptable for Xpert Xpress SARS-CoV-2/FLU/RSV testing.  Fact Sheet for Patients: EntrepreneurPulse.com.au  Fact Sheet for Healthcare Providers: IncredibleEmployment.be  This test is not yet approved or cleared by the [place] FDA and has been authorized for detection and/or diagnosis of SARS-CoV-2 by FDA under an Emergency Use Authorization (EUA). This EUA will remain in effect (meaning this test can be used) for the duration of the COVID-19 declaration under Section 564(b)(1) of the Act, 21 U.S.C. section 360bbb-3(b)(1), unless the authorization is terminated or revoked.  Performed at [facility], [place] [address], [place], [place] [address]   Blood Culture (routine x 2)     Status: None (Preliminary result)   Collection Time: 06/20/20  8:25 PM   Specimen: BLOOD RIGHT HAND  Result Value Ref Range Status   Specimen Description   Final    BLOOD RIGHT HAND Performed at [facility] [address],  [place], [place] [address]    Special Requests   Final    BOTTLES DRAWN AEROBIC AND ANAEROBIC Blood Culture results may not be optimal due to an inadequate volume of blood received in culture bottles Performed at [facility] [address], [place], [place] [address]    Culture   Final    NO GROWTH 1 DAY Performed at [facility], [place] [address], [place], [place] [address]    Report Status PENDING  Incomplete         Radiology Studies: No results found.      Scheduled Meds: . amLODipine  5 mg Oral Daily  . vitamin C  500 mg Oral Daily  . baricitinib  2 mg Oral Daily  . enoxaparin (LOVENOX) injection  40 mg Subcutaneous Q24H  . Ipratropium-Albuterol  1 puff Inhalation Q6H  . loratadine  10 mg Oral Daily  . methylPREDNISolone (SOLU-MEDROL) injection  1 mg/kg Intravenous Q12H   Followed by  . [START ON 06/24/2020] predniSONE  50 mg Oral Daily  . zinc sulfate  220 mg Oral Daily   Continuous Infusions: . remdesivir 100 mg in NS 100 mL 100 mg (06/23/20 0957)     LOS: 2 days    Time spent: 37 minutes spent on chart review, discussion with nursing staff, consultants, updating family and interview/physical exam; more than 50% of that time was spent in counseling and/or coordination of care.    Athalee Esterline J [place] DO [facility] Available via Epic secure chat 7am-7pm After these hours, please refer to coverage provider listed on [email or web address] 06/23/2020, 12:40 PM

## 2020-06-23 NOTE — Plan of Care (Signed)
Patient remains on 2 liters with oxygen saturations in the 90's. Continued to monitor patient this shift for retention, patient retaining small amounts not enough for catheterization.  Patient up to chair with one max assist.  Daughter at bedside.

## 2020-06-23 NOTE — Evaluation (Signed)
Physical Therapy Evaluation Patient Details Name: Melanie Walker MRN: [id number] DOB: 01/10/1931 Today's Date: 06/23/2020   History of Present Illness  87 year old female with past medical history significant for COPD, essential hypertension who presented to the ED with progressive generalized weakness, shortness of breath with multiple falls and confusion.  Patient is a poor historian from suspect encephalopathy and history obtained from both the patient and daughter who is present at bedside as well as discussions with emergency department staff.  Dyspnea worse with exertion and improved with rest.  Also patient with lower abdominal pain with difficulty urinating.  Recently diagnosed with a urinary tract infection and completed course of ciprofloxacin.  Although believe her symptoms are related to urinary retention.  Pt tested positive for COVID.  Clinical Impression  Pt admitted with above diagnosis.  Pt currently on 2L [facility] O2. SpO2=84-92% during activity on 2L throughout PT session. Pt with limited activity tolerance and decr balance, recommend SNF post acute to allow incr independence prior to return home with dtr.   Pt currently with functional limitations due to the deficits listed below (see PT Problem List). Pt will benefit from skilled PT to increase their independence and safety with mobility to allow discharge to the venue listed below.       Follow Up Recommendations SNF    Equipment Recommendations  None recommended by PT    Recommendations for Other Services       Precautions / Restrictions Precautions Precautions: Fall Precaution Comments: monitor O2 Restrictions Weight Bearing Restrictions: No      Mobility  Bed Mobility Overal bed mobility: Needs Assistance Bed Mobility: Sit to Supine     Supine to sit: Min guard Sit to supine: Min guard   General bed mobility comments: for safety    Transfers Overall transfer level: Needs assistance Equipment used: Rolling  walker (2 wheeled) Transfers: Sit to/from [facility] Sit to Stand: Min assist Stand pivot transfers: Min assist       General transfer comment: Pt insisted on having floor mat under her feet before agreeing to stand and pivot back to bed.  Pt placed feet anteriorly trying to reach feet with mat because she believes floor to be too slippery. Pt with decreased awareness of fall risk activities and decreased ability to follow instructions for safety possibly due to anxiety from recent falls.  With stand to sit, pt cues to reach back for bed rail, but not following.  Ambulation/Gait             General Gait Details: pivotal steps back to bed  Stairs            Wheelchair Mobility    Modified Rankin (Stroke Patients Only)       Balance Overall balance assessment: Needs assistance   Sitting balance-Leahy Scale: Fair Sitting balance - Comments: Briefly at EOB and unable to make good sitting balance determination.     Standing balance-Leahy Scale: Poor Standing balance comment: Unsteady with need of BUE support on RW.                             Pertinent Vitals/Pain Pain Assessment: 0-10 Pain Score: 5  Pain Location: Bil knees to feet due to chronic neuropathy. Pain Descriptors / Indicators: Tingling;Tender Pain Intervention(s): Limited activity within patient's tolerance;Monitored during session;Repositioned    Home Living Family/patient expects to be discharged to:: Skilled nursing facility Living Arrangements: Alone Available Help at Discharge:  Family;Available PRN/intermittently (Family is working on it.) Type of Home: House Home Access: Stairs to enter Entrance Stairs-Rails: None Entrance Stairs-Number of Steps: 1 + 1 Home Layout: One level Home Equipment: Grab bars - tub/shower;Cane - single point;Walker - standard;Cane - quad;Shower seat;Adaptive equipment      Prior Function Level of Independence: Needs assistance   Gait /  Transfers Assistance Needed: Uses standard walker as needed for foot/LE pain and balance.  ADL's / Homemaking Assistance Needed: Daughter provides transporation.  Pt has a regular cleaning lady. Daughter brings groceries and performs some cooking.        Hand Dominance   Dominant Hand: Right    Extremity/Trunk Assessment   Upper Extremity Assessment Upper Extremity Assessment: Overall WFL for tasks assessed;Defer to OT evaluation    Lower Extremity Assessment Lower Extremity Assessment: Generalized weakness    Cervical / Trunk Assessment Cervical / Trunk Assessment: Normal  Communication   Communication: No difficulties  Cognition Arousal/Alertness: Awake/alert Behavior During Therapy: Anxious;Flat affect Overall Cognitive Status: Impaired/Different from baseline Area of Impairment: Safety/judgement                         Safety/Judgement: Decreased awareness of safety            General Comments      Exercises     Assessment/Plan    PT Assessment Patient needs continued PT services  PT Problem List Decreased strength;Decreased mobility;Decreased activity tolerance;Decreased balance;Decreased safety awareness;Decreased knowledge of use of DME       PT Treatment Interventions DME instruction;Therapeutic activities;Gait training;Functional mobility training;Therapeutic exercise;Patient/family education;Balance training    PT Goals (Current goals can be found in the Care Plan section)  Acute Rehab PT Goals Patient Stated Goal: Daughter: For pt to go to Rehab. PT Goal Formulation: With family Time For Goal Achievement: 07/07/20 Potential to Achieve Goals: Good    Frequency Min 2X/week   Barriers to discharge        Co-evaluation PT/OT/SLP Co-Evaluation/Treatment: Yes   PT goals addressed during session: Mobility/safety with mobility;Proper use of DME         AM-PAC PT "6 Clicks" Mobility  Outcome Measure Help needed turning from your  back to your side while in a flat bed without using bedrails?: A Little Help needed moving from lying on your back to sitting on the side of a flat bed without using bedrails?: A Little Help needed moving to and from a bed to a chair (including a wheelchair)?: A Little Help needed standing up from a chair using your arms (e.g., wheelchair or bedside chair)?: A Little Help needed to walk in hospital room?: A Little Help needed climbing 3-5 steps with a railing? : A Lot 6 Click Score: 17    End of Session   Activity Tolerance: Patient tolerated treatment well;Patient limited by fatigue Patient left: with call bell/phone within reach;with family/visitor present;in bed;with bed alarm set Nurse Communication: Mobility status PT Visit Diagnosis: Other abnormalities of gait and mobility (R26.89)    Time: 9892-1194 PT Time Calculation (min) (ACUTE ONLY): 23 min   Charges:   PT Evaluation $PT Eval Low Complexity: 1 Low          Tara, PT  Acute Rehab Dept ([place]) [phone] Pager [phone]  06/23/2020   [facility] 06/23/2020, 4:00 PM

## 2020-06-24 DIAGNOSIS — U071 COVID-19: Secondary | ICD-10-CM | POA: Diagnosis not present

## 2020-06-24 DIAGNOSIS — J9600 Acute respiratory failure, unspecified whether with hypoxia or hypercapnia: Secondary | ICD-10-CM | POA: Diagnosis not present

## 2020-06-24 LAB — COMPREHENSIVE METABOLIC PANEL
ALT: 29 U/L (ref 0–44)
AST: 36 U/L (ref 15–41)
Albumin: 2.4 g/dL — ABNORMAL LOW (ref 3.5–5.0)
Alkaline Phosphatase: 68 U/L (ref 38–126)
Anion gap: 7 (ref 5–15)
BUN: 23 mg/dL (ref 8–23)
CO2: 26 mmol/L (ref 22–32)
Calcium: 7.8 mg/dL — ABNORMAL LOW (ref 8.9–10.3)
Chloride: 102 mmol/L (ref 98–111)
Creatinine, Ser: 0.66 mg/dL (ref 0.44–1.00)
GFR, Estimated: >60 mL/min (ref >60)
Glucose, Bld: 141 mg/dL — ABNORMAL HIGH (ref 70–99)
Potassium: 3.9 mmol/L (ref 3.5–5.1)
Sodium: 135 mmol/L (ref 135–145)
Total Bilirubin: 0.7 mg/dL (ref 0.3–1.2)
Total Protein: 4.9 g/dL — ABNORMAL LOW (ref 6.5–8.1)

## 2020-06-24 LAB — CBC WITH DIFFERENTIAL/PLATELET
Abs Immature Granulocytes: 0.07 10*3/uL (ref 0.00–0.07)
Basophils Absolute: 0 10*3/uL (ref 0.0–0.1)
Basophils Relative: 0 %
Eosinophils Absolute: 0 10*3/uL (ref 0.0–0.5)
Eosinophils Relative: 0 %
HCT: 42 % (ref 36.0–46.0)
Hemoglobin: 13.9 g/dL (ref 12.0–15.0)
Immature Granulocytes: 1 %
Lymphocytes Relative: 14 %
Lymphs Abs: 0.8 10*3/uL (ref 0.7–4.0)
MCH: 29.6 pg (ref 26.0–34.0)
MCHC: 33.1 g/dL (ref 30.0–36.0)
MCV: 89.6 fL (ref 80.0–100.0)
Monocytes Absolute: 0.3 10*3/uL (ref 0.1–1.0)
Monocytes Relative: 5 %
Neutro Abs: 4.8 10*3/uL (ref 1.7–7.7)
Neutrophils Relative %: 80 %
Platelets: 391 10*3/uL (ref 150–400)
RBC: 4.69 MIL/uL (ref 3.87–5.11)
RDW: 13.7 % (ref 11.5–15.5)
WBC: 6 10*3/uL (ref 4.0–10.5)
nRBC: 0 % (ref 0.0–0.2)

## 2020-06-24 LAB — MAGNESIUM: Magnesium: 2.2 mg/dL (ref 1.7–2.4)

## 2020-06-24 LAB — C-REACTIVE PROTEIN: CRP: 0.6 mg/dL (ref <1.0)

## 2020-06-24 LAB — D-DIMER, QUANTITATIVE: D-Dimer, Quant: 1.86 ug/mL-FEU — ABNORMAL HIGH (ref 0.00–0.50)

## 2020-06-24 NOTE — [place] FL2 (Signed)
[place] MEDICAID FL2 LEVEL OF CARE SCREENING TOOL     IDENTIFICATION  Patient Name: Melanie Walker Birthdate: [birth year] Sex: female Admission Date (Current Location): 06/20/2020  [facility] and [place] Number:  Herbalist and Address:  [facility],  [address] [place], [place]      Provider Number: [id number]  Attending Physician Name and Address:  [place] Eric J, DO  Relative Name and Phone Number:  Edinger,Terri Daughter [phone]    Current Level of Care: Hospital Recommended Level of Care: [place] Prior Approval Number:    Date Approved/Denied:   PASRR Number: [id number]  Discharge Plan: SNF    Current Diagnoses: Patient Active Problem List   Diagnosis Date Noted  . Acute urinary retention 06/21/2020  . Acute metabolic encephalopathy 09/32/6712  . 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[place]) 06/21/2020  . COPD with emphysema ([place]) 06/21/2020  . Acute respiratory failure due to COVID-19 ([place]) 06/20/2020  . Mild traumatic brain injury ([place]) 03/29/2018  . Lumbar radiculopathy 09/30/2017  . Paresthesia 02/12/2016  . Abnormality of gait 03/12/2015  . Risk for falls 01/25/2015  . Essential hypertension 01/25/2015  . Seizure ([place])   . Complex partial seizure ([place]) 03/08/2013  . Numbness 03/08/2013  . Low back pain 03/08/2013    Orientation RESPIRATION BLADDER Height & Weight     Self,Time,Situation,Place  O2 (2L O2) Continent Weight: 46.4 kg Height:  4\' 11"  (149.9 cm)  BEHAVIORAL SYMPTOMS/MOOD NEUROLOGICAL BOWEL NUTRITION STATUS    Convulsions/Seizures Continent Diet (Heart Healthy)  AMBULATORY STATUS COMMUNICATION OF NEEDS Skin   Limited Assist Verbally Normal                       Personal Care Assistance Level of Assistance  Bathing,Feeding,Dressing Bathing Assistance: Maximum assistance Feeding assistance: Independent Dressing Assistance: Maximum assistance     Functional Limitations Info   Sight,Hearing,Speech Sight Info: Impaired Hearing Info: Adequate Speech Info: Adequate    SPECIAL CARE FACTORS FREQUENCY  PT (By licensed PT),OT (By licensed OT)     PT Frequency: 5x week OT Frequency: 5x week            Contractures Contractures Info: Not present    Additional Factors Info  Code Status,Allergies Code Status Info: FULL Allergies Info: Biaxin (Clarithromycin), Boniva (Ibandronic Acid), Ceftin (Cefuroxime Axetil), Dilantin (Phenytoin Sodium Extended), Fosamax (Alendronate Sodium), Guaifenesin & Derivatives, Imitrex (Sumatriptan), Keflex (Cephalexin), Macrobid (Nitrofurantoin Macrocrystal), Montelukast, Other, Penicillins, Premarin (Conjugated Estrogens), Sulfa Antibiotics, Hctz (Hydrochlorothiazide), Latex           Current Medications (06/24/2020):  This is the current hospital active medication list Current Facility-Administered Medications  Medication Dose Route Frequency Provider Last Rate Last Admin  . acetaminophen (TYLENOL) tablet 650 mg  650 mg Oral Q6H PRN Shalhoub, Sherryll Burger, MD      . amLODipine (NORVASC) tablet 5 mg  5 mg Oral Daily Shalhoub, Sherryll Burger, MD   5 mg at 06/24/20 0811  . ascorbic acid (VITAMIN C) tablet 500 mg  500 mg Oral Daily Shalhoub, Sherryll Burger, MD   500 mg at 06/24/20 0810  . baricitinib (OLUMIANT) tablet 4 mg  4 mg Oral Daily [place] Donnamarie Poag, DO   4 mg at 06/24/20 0810  . chlorpheniramine-HYDROcodone (TUSSIONEX) 10-8 MG/5ML suspension 5 mL  5 mL Oral Q12H PRN Shalhoub, Sherryll Burger, MD      . enoxaparin (LOVENOX) injection 40 mg  40 mg Subcutaneous Q24H Shalhoub, Sherryll Burger, MD   40  mg at 06/24/20 8182  . hydrALAZINE (APRESOLINE) injection 10 mg  10 mg Intravenous Q6H PRN Shalhoub, Sherryll Burger, MD      . Ipratropium-Albuterol (COMBIVENT) respimat 1 puff  1 puff Inhalation BID [place] Donnamarie Poag, DO      . Ipratropium-Albuterol (COMBIVENT) respimat 1 puff  1 puff Inhalation Q6H PRN [place] Donnamarie Poag, DO      . loratadine (CLARITIN) tablet 10 mg  10  mg Oral Daily Shalhoub, Sherryll Burger, MD   10 mg at 06/24/20 0810  . melatonin tablet 10 mg  10 mg Oral QHS PRN Vernelle Emerald, MD   10 mg at 06/23/20 2058  . ondansetron (ZOFRAN) tablet 4 mg  4 mg Oral Q6H PRN Shalhoub, Sherryll Burger, MD       Or  . ondansetron [facility]) injection 4 mg  4 mg Intravenous Q6H PRN Shalhoub, Sherryll Burger, MD   4 mg at 06/21/20 2330  . polyethylene glycol (MIRALAX / GLYCOLAX) packet 17 g  17 g Oral Daily PRN Vernelle Emerald, MD   17 g at 06/23/20 1001  . predniSONE (DELTASONE) tablet 50 mg  50 mg Oral Daily Shalhoub, Sherryll Burger, MD   50 mg at 06/24/20 0811  . remdesivir 100 mg in sodium chloride 0.9 % 100 mL IVPB  100 mg Intravenous Daily Shalhoub, Sherryll Burger, MD 200 mL/hr at 06/24/20 0811 100 mg at 06/24/20 0811  . zinc sulfate capsule 220 mg  220 mg Oral Daily Shalhoub, Sherryll Burger, MD   220 mg at 06/24/20 9937     Discharge Medications: Please see discharge summary for a list of discharge medications.  Relevant Imaging Results:  Relevant Lab Results:   Additional Information [phone]  Purcell Mouton, RN

## 2020-06-24 NOTE — Care Management Important Message (Deleted)
Important Message  Patient Details IM Letter given to the Patient. Name: Melanie Walker MRN: [id number] Date of Birth: 04/19/1930   Medicare Important Message Given:  Yes     Kerin Salen 06/24/2020, 2:12 PM

## 2020-06-24 NOTE — Care Management Important Message (Signed)
Important Message  Patient Details IM Letter placed in Patient's door caddy Name: Melanie Walker MRN: [id number] Date of Birth: 01/05/1931   Medicare Important Message Given:  Yes     Kerin Salen 06/24/2020, 2:13 PM

## 2020-06-24 NOTE — Progress Notes (Signed)
PROGRESS NOTE    Melanie Walker  NTI:[id number] DOB: 1930/09/01 DOA: 06/20/2020 PCP: Lajean Manes, MD    Brief Narrative:  Melanie Walker is an 87 year old female with past medical history significant for COPD, essential hypertension who presented to the ED with progressive generalized weakness, shortness of breath with multiple falls and confusion.  Patient is a poor historian from suspect encephalopathy and history obtained from both the patient and daughter who is present at bedside as well as discussions with emergency department staff.  Dyspnea worse with exertion and improved with rest.  Also patient with lower abdominal pain with difficulty urinating.  Recently diagnosed with a urinary tract infection and completed course of ciprofloxacin.  Although believe her symptoms are related to urinary retention.  In the ED, temperature 98.5 F, HR 72, RR 22, BP 156/78, SPO2 96% on 4 L nasal cannula (not oxygen dependent at baseline).  Sodium 132, potassium 3.8, chloride 100, CO2 23, glucose 88, BUN 15, creatinine 0.66, magnesium 2.1, AST 59, ALT 34, total bilirubin 1.5.  Lactic acid 1.0.  Procalcitonin less than 0.10.  WBC 3.5, hemoglobin 14.9, platelets 314.  Influenza A/B PCR negative.  SARS-CoV-2/Covid-19 PCR positive.  Chest x-ray with COPD without acute cardiopulmonary disease process.  CT head/C-spine without contrast with mild diffuse cortical atrophy and chronic ischemic [place] matter disease without acute intracranial abnormality and multilevel degenerative disc disease with no acute abnormality in the cervical spine.  CT chest with marked severity bilateral peripheral infiltrates with area of right lower lobe masslike consolidation consistent with pneumonia versus atelectasis with small right pleural effusion.  Hospitalist service consulted for further evaluation of acute hypoxic respite failure secondary to Covid-19 viral pneumonia.   Assessment & Plan:   Principal Problem:   Acute respiratory  failure due to COVID-19 [facility]) Active Problems:   Complex partial seizure ([place])   Essential hypertension   Acute urinary retention   Acute metabolic encephalopathy   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HCC)   COPD with emphysema (HCC)   Acute hypoxic respiratory failure secondary to acute Covid-19 viral pneumonia during the ongoing Covid 19 Pandemic - POA Patient presents with several week history of progressive weakness, shortness of breath.  Patient is unvaccinated.  On arrival to the ED, patient with significant hypoxia with SPO2 in the 80s on room air.  Patient was found to have multifocal infiltrates on CT chest.  Covid-19 PCR positive.  --COVID test: + PCR 06/20/20 --CRP 2.0>0.9>0.6 --ddimer 3.76>2.65>1.86 --Remdesivir, plan 5-day course (Day #4/5) --Baricitinib 4mg  PO daily (Day #4/14) --IV Solumedrol now transitioned to prednisone 50mg  PO daily 3/21 --prone for 2-3hrs every 12hrs if able --Continue supplemental oxygen, titrate to maintain SPO2 greater than 88%, on 2L [place] w/ SpO2 99% at rest, but does desaturate with movement --Combivent MDI 1 puff every 6 hours --Continue supportive care with vitamin C, zinc, Tylenol, antitussives  --Follow CBC, CMP, D-dimer, and CRP daily --Continue airborne/contact isolation precautions   The treatment plan and use of medications and known side effects were discussed with patient/family. Some of the medications used are based on case reports/anecdotal data.  All other medications being used in the management of COVID-19 based on limited study data.  Complete risks and long-term side effects are unknown, however in the best clinical judgment they seem to be of some benefit.  Patient wanted to proceed with treatment options provided.  Essential hypertension --Amlodipine 5 mg p.o. daily  COPD Lung nodules Not oxygen dependent at baseline.  Recently diagnosed  with spiculated lung nodule right lower lobe measuring 2.6 x 2.6 cm, spiculated nodule  medial right lower lobe measuring 1.6 x 1.5 cm without evidence of hilar/mediastinal lymphadenopathy.  Is currently being followed by pulmonary outpatient, Dr. Valeta Harms.  Pending PET CT and CT super D chest without contrast. --Outpatient follow-up with pulmonology   DVT prophylaxis: Lovenox   Code Status: Full Code Family Communication: Updated patients daughter Terri via telephone this am.  Disposition Plan:  Level of care: Med-Surg Status is: Inpatient  Remains inpatient appropriate because:Ongoing diagnostic testing needed not appropriate for outpatient work up, Unsafe d/c plan, IV treatments appropriate due to intensity of illness or inability to take PO and Inpatient level of care appropriate due to severity of illness   Dispo: The patient is from: Home              Anticipated d/c is to: Home              Patient currently is not medically stable to d/c.   Difficult to place patient No  Consultants:   none  Procedures:   none  Antimicrobials:   none   Subjective: Patient seen and examined bedside, resting comfortably.  No family present.  Patient reports fatigue and weakness, otherwise no other concerns this morning.  Continues on 2 L nasal cannula.  No other questions or concerns at this time.  Denies headache, no visual changes, no chest pain, no palpitations, no abdominal pain.  No acute concerns overnight per nursing staff.  Objective: Vitals:   06/23/20 0316 06/23/20 1231 06/23/20 2104 06/24/20 0417  BP: (!) 161/76 118/65 (!) 154/67 (!) 149/92  Pulse:  64 68 62  Resp: 18 18 20 20   Temp: (!) 97.3 F (36.3 C) 98.2 F (36.8 C) 97.8 F (36.6 C) 97.9 F (36.6 C)  TempSrc: Oral Oral Oral Oral  SpO2: 93% 90% 90% 99%  Weight:      Height:        Intake/Output Summary (Last 24 hours) at 06/24/2020 1031 Last data filed at 06/24/2020 0656 Gross per 24 hour  Intake 220 ml  Output 750 ml  Net -530 ml   Filed Weights   06/21/20 1514  Weight: 46.4 kg     Examination:  General exam: Appears calm and comfortable, ill in appearance Respiratory system: Clear to auscultation. Respiratory effort normal.  On 2 L nasal cannula SPO2 95% Cardiovascular system: S1 & S2 heard, RRR. No JVD, murmurs, rubs, gallops or clicks. No pedal edema. Gastrointestinal system: Abdomen is nondistended, soft and nontender. No organomegaly or masses felt. Normal bowel sounds heard. Central nervous system: Alert and oriented. No focal neurological deficits. Extremities: Symmetric 5 x 5 power. Skin: No rashes, lesions or ulcers Psychiatry: Judgement and insight appear poor. Mood & affect appropriate.     Data Reviewed: I have personally reviewed following labs and imaging studies  CBC: Recent Labs  Lab 06/20/20 1600 06/22/20 0422 06/23/20 0416 06/24/20 0436  WBC 3.5* 4.9 6.2 6.0  NEUTROABS 2.4 4.1 5.0 4.8  HGB 14.9 13.9 14.1 13.9  HCT 45.1 41.7 43.2 42.0  MCV 89.7 89.3 89.3 89.6  PLT 314 347 411* 973   Basic Metabolic Panel: Recent Labs  Lab 06/20/20 1600 06/22/20 0422 06/23/20 0416 06/24/20 0436  NA 132* 138 138 135  K 3.8 3.9 3.8 3.9  CL 100 107 104 102  CO2 23 24 25 26   GLUCOSE 88 136* 141* 141*  BUN 15 16 18 23   CREATININE 0.66  0.46 0.47 0.66  CALCIUM 8.2* 7.7* 8.0* 7.8*  MG 2.1 2.0 2.2 2.2   GFR: Estimated Creatinine Clearance: 33.8 mL/min (by C-G formula based on SCr of 0.66 mg/dL). Liver Function Tests: Recent Labs  Lab 06/20/20 1600 06/22/20 0422 06/23/20 0416 06/24/20 0436  AST 59* 37 36 36  ALT 34 26 26 29   ALKPHOS 87 69 67 68  BILITOT 1.5* 0.6 0.7 0.7  PROT 6.3* 5.2* 5.1* 4.9*  ALBUMIN 3.1* 2.5* 2.5* 2.4*   No results for input(s): LIPASE, AMYLASE in the last 168 hours. No results for input(s): AMMONIA in the last 168 hours. Coagulation Profile: Recent Labs  Lab 06/20/20 1600  INR 0.9   Cardiac Enzymes: No results for input(s): CKTOTAL, CKMB, CKMBINDEX, TROPONINI in the last 168 hours. BNP (last 3  results) No results for input(s): PROBNP in the last 8760 hours. HbA1C: No results for input(s): HGBA1C in the last 72 hours. CBG: Recent Labs  Lab 06/20/20 1720  GLUCAP 82   Lipid Profile: No results for input(s): CHOL, HDL, LDLCALC, TRIG, CHOLHDL, LDLDIRECT in the last 72 hours. Thyroid Function Tests: No results for input(s): TSH, T4TOTAL, FREET4, T3FREE, THYROIDAB in the last 72 hours. Anemia Panel: No results for input(s): VITAMINB12, FOLATE, FERRITIN, TIBC, IRON, RETICCTPCT in the last 72 hours. Sepsis Labs: Recent Labs  Lab 06/20/20 1600  PROCALCITON <0.10  LATICACIDVEN 1.0    Recent Results (from the past 240 hour(s))  Blood Culture (routine x 2)     Status: None (Preliminary result)   Collection Time: 06/20/20  3:57 PM   Specimen: BLOOD  Result Value Ref Range Status   Specimen Description   Final    BLOOD SITE NOT SPECIFIED Performed at [facility], [address], [place], [place] [address]    Special Requests   Final    BOTTLES DRAWN AEROBIC AND ANAEROBIC Blood Culture adequate volume Performed at [place] [address] [place], [place] [address]    Culture   Final    NO GROWTH 3 DAYS Performed at [facility], [place] [address], [place],  [address]    Report Status PENDING  Incomplete  Resp Panel by RT-PCR (Flu A&B, Covid) Nasopharyngeal Swab     Status: Abnormal   Collection Time: 06/20/20  5:23 PM   Specimen: Nasopharyngeal Swab; Nasopharyngeal(NP) swabs in vial transport medium  Result Value Ref Range Status   SARS Coronavirus 2 by RT PCR POSITIVE (A) NEGATIVE Final    Comment: RESULT CALLED TO, READ BACK BY AND VERIFIED WITH: BROOKS B. 03.17.22 @ 2144 BY MECAIL J. (NOTE) SARS-CoV-2 target nucleic acids are DETECTED.  The SARS-CoV-2 RNA is generally detectable in upper respiratory specimens during the acute phase of infection. Positive results are indicative of the presence of the identified virus, but do  not rule out bacterial infection or co-infection with other pathogens not detected by the test. Clinical correlation with patient history and other diagnostic information is necessary to determine patient infection status. The expected result is Negative.  Fact Sheet for Patients: EntrepreneurPulse.com.au  Fact Sheet for Healthcare Providers: IncredibleEmployment.be  This test is not yet approved or cleared by the [place] FDA and  has been authorized for detection and/or diagnosis of SARS-CoV-2 by FDA under an Emergency Use Authorization (EUA).  This EUA will remain in effect (meaning this test c an be used) for the duration of  the COVID-19 declaration under Section 564(b)(1) of the Act, 21 U.S.C. section 360bbb-3(b)(1),  unless the authorization is terminated or revoked sooner.     Influenza A by PCR NEGATIVE NEGATIVE Final   Influenza B by PCR NEGATIVE NEGATIVE Final    Comment: (NOTE) The Xpert Xpress SARS-CoV-2/FLU/RSV plus assay is intended as an aid in the diagnosis of influenza from Nasopharyngeal swab specimens and should not be used as a sole basis for treatment. Nasal washings and aspirates are unacceptable for Xpert Xpress SARS-CoV-2/FLU/RSV testing.  Fact Sheet for Patients: EntrepreneurPulse.com.au  Fact Sheet for Healthcare Providers: IncredibleEmployment.be  This test is not yet approved or cleared by the [place] FDA and has been authorized for detection and/or diagnosis of SARS-CoV-2 by FDA under an Emergency Use Authorization (EUA). This EUA will remain in effect (meaning this test can be used) for the duration of the COVID-19 declaration under Section 564(b)(1) of the Act, 21 U.S.C. section 360bbb-3(b)(1), unless the authorization is terminated or revoked.  Performed at [facility], [place] [address], [place], [place] [address]   Blood Culture (routine x  2)     Status: None (Preliminary result)   Collection Time: 06/20/20  8:25 PM   Specimen: BLOOD RIGHT HAND  Result Value Ref Range Status   Specimen Description   Final    BLOOD RIGHT HAND Performed at [place] [address], [place], [place] [address]    Special Requests   Final    BOTTLES DRAWN AEROBIC AND ANAEROBIC Blood Culture results may not be optimal due to an inadequate volume of blood received in culture bottles Performed at [place] [facility] [address] [place], [place] [address]    Culture   Final    NO GROWTH 3 DAYS Performed at [facility], [place] [address] [place], [place] [address]    Report Status PENDING  Incomplete         Radiology Studies: No results found.      Scheduled Meds: . amLODipine  5 mg Oral Daily  . vitamin C  500 mg Oral Daily  . baricitinib  4 mg Oral Daily  . enoxaparin (LOVENOX) injection  40 mg Subcutaneous Q24H  . Ipratropium-Albuterol  1 puff Inhalation BID  . loratadine  10 mg Oral Daily  . predniSONE  50 mg Oral Daily  . zinc sulfate  220 mg Oral Daily   Continuous Infusions: . remdesivir 100 mg in NS 100 mL 100 mg (06/24/20 0811)     LOS: 3 days    Time spent: 37 minutes spent on chart review, discussion with nursing staff, consultants, updating family and interview/physical exam; more than 50% of that time was spent in counseling and/or coordination of care.    Abenezer Odonell J [place] DO [facility] Available via Epic secure chat 7am-7pm After these hours, please refer to coverage provider listed on [email or web address] 06/24/2020, 10:31 AM

## 2020-06-24 NOTE — TOC Progression Note (Signed)
Transition of Care [facility]) - Progression Note    Patient Details  Name: Melanie Walker MRN: [id number] Date of Birth: 1931/02/04  Transition of [facility]) CM/SW Contact  Purcell Mouton, RN Phone Number: 06/24/2020, 12:18 PM  Clinical Narrative:    Spoke with pt's daughter who asked for pt to go to SNF. Faxed pt out to SNF. Waiting for bed offers and insurance authorization.    Expected Discharge Plan: [place] Barriers to Discharge: No Barriers Identified  Expected Discharge Plan and Services Expected Discharge Plan: [place] arrangements for the past 2 months: Single Family Home                                       Social Determinants of Health (SDOH) Interventions    Readmission Risk Interventions No flowsheet data found.

## 2020-06-25 ENCOUNTER — Encounter (HOSPITAL_COMMUNITY): Payer: Medicare HMO

## 2020-06-25 DIAGNOSIS — U071 COVID-19: Secondary | ICD-10-CM | POA: Diagnosis not present

## 2020-06-25 DIAGNOSIS — J9600 Acute respiratory failure, unspecified whether with hypoxia or hypercapnia: Secondary | ICD-10-CM | POA: Diagnosis not present

## 2020-06-25 LAB — COMPREHENSIVE METABOLIC PANEL WITH GFR
ALT: 36 U/L (ref 0–44)
AST: 44 U/L — ABNORMAL HIGH (ref 15–41)
Albumin: 2.4 g/dL — ABNORMAL LOW (ref 3.5–5.0)
Alkaline Phosphatase: 66 U/L (ref 38–126)
Anion gap: 7 (ref 5–15)
BUN: 25 mg/dL — ABNORMAL HIGH (ref 8–23)
CO2: 26 mmol/L (ref 22–32)
Calcium: 7.7 mg/dL — ABNORMAL LOW (ref 8.9–10.3)
Chloride: 103 mmol/L (ref 98–111)
Creatinine, Ser: 0.52 mg/dL (ref 0.44–1.00)
GFR, Estimated: >60 mL/min
Glucose, Bld: 117 mg/dL — ABNORMAL HIGH (ref 70–99)
Potassium: 3.6 mmol/L (ref 3.5–5.1)
Sodium: 136 mmol/L (ref 135–145)
Total Bilirubin: 0.9 mg/dL (ref 0.3–1.2)
Total Protein: 4.6 g/dL — ABNORMAL LOW (ref 6.5–8.1)

## 2020-06-25 LAB — CBC WITH DIFFERENTIAL/PLATELET
Abs Immature Granulocytes: 0.14 10*3/uL — ABNORMAL HIGH (ref 0.00–0.07)
Basophils Absolute: 0 10*3/uL (ref 0.0–0.1)
Basophils Relative: 0 %
Eosinophils Absolute: 0 10*3/uL (ref 0.0–0.5)
Eosinophils Relative: 0 %
HCT: 41 % (ref 36.0–46.0)
Hemoglobin: 13.4 g/dL (ref 12.0–15.0)
Immature Granulocytes: 2 %
Lymphocytes Relative: 17 %
Lymphs Abs: 1.3 10*3/uL (ref 0.7–4.0)
MCH: 29.1 pg (ref 26.0–34.0)
MCHC: 32.7 g/dL (ref 30.0–36.0)
MCV: 88.9 fL (ref 80.0–100.0)
Monocytes Absolute: 0.8 10*3/uL (ref 0.1–1.0)
Monocytes Relative: 10 %
Neutro Abs: 5.7 10*3/uL (ref 1.7–7.7)
Neutrophils Relative %: 71 %
Platelets: 359 10*3/uL (ref 150–400)
RBC: 4.61 MIL/uL (ref 3.87–5.11)
RDW: 13.6 % (ref 11.5–15.5)
WBC: 7.9 10*3/uL (ref 4.0–10.5)
nRBC: 0 % (ref 0.0–0.2)

## 2020-06-25 LAB — C-REACTIVE PROTEIN: CRP: 0.6 mg/dL (ref <1.0)

## 2020-06-25 LAB — MAGNESIUM: Magnesium: 2.2 mg/dL (ref 1.7–2.4)

## 2020-06-25 LAB — D-DIMER, QUANTITATIVE: D-Dimer, Quant: 1.82 ug/mL-FEU — ABNORMAL HIGH (ref 0.00–0.50)

## 2020-06-25 MED ORDER — GUAIFENESIN ER 600 MG PO TB12: 600.0000 mg | ORAL_TABLET | Freq: Two times a day (BID) | ORAL | Status: DC | PRN

## 2020-06-25 MED ORDER — LACTINEX PO CHEW
1.0000 | CHEWABLE_TABLET | Freq: Three times a day (TID) | ORAL | Status: DC
  Administered 2020-06-25 – 2020-07-02 (×15): 1 via ORAL
  Filled 2020-06-25 (×23): qty 1

## 2020-06-25 MED ORDER — BACID PO TABS
2.0000 | ORAL_TABLET | Freq: Three times a day (TID) | ORAL | Status: DC
  Filled 2020-06-25: qty 2

## 2020-06-25 NOTE — Progress Notes (Signed)
PT Cancellation Note  Patient Details Name: Melanie Walker MRN: [id number] DOB: 10-29-1930   Cancelled Treatment:    Reason Eval/Treat Not Completed:  Attempted PT tx session-pt and family declined participation at this time (pt not ready to move after eating). Will check back another day.    [facility]  Office: [phone] Pager: [phone]

## 2020-06-25 NOTE — Progress Notes (Signed)
PROGRESS NOTE    KEIANDRA SULLENGER  JJO:[id number] DOB: 04/26/1930 DOA: 06/20/2020 PCP: Lajean Manes, MD    Brief Narrative:  MCKENSEY BERGHUIS is an 87 year old female with past medical history significant for COPD, essential hypertension who presented to the ED with progressive generalized weakness, shortness of breath with multiple falls and confusion.  Patient is a poor historian from suspect encephalopathy and history obtained from both the patient and daughter who is present at bedside as well as discussions with emergency department staff.  Dyspnea worse with exertion and improved with rest.  Also patient with lower abdominal pain with difficulty urinating.  Recently diagnosed with a urinary tract infection and completed course of ciprofloxacin.  Although believe her symptoms are related to urinary retention.  In the ED, temperature 98.5 F, HR 72, RR 22, BP 156/78, SPO2 96% on 4 L nasal cannula (not oxygen dependent at baseline).  Sodium 132, potassium 3.8, chloride 100, CO2 23, glucose 88, BUN 15, creatinine 0.66, magnesium 2.1, AST 59, ALT 34, total bilirubin 1.5.  Lactic acid 1.0.  Procalcitonin less than 0.10.  WBC 3.5, hemoglobin 14.9, platelets 314.  Influenza A/B PCR negative.  SARS-CoV-2/Covid-19 PCR positive.  Chest x-ray with COPD without acute cardiopulmonary disease process.  CT head/C-spine without contrast with mild diffuse cortical atrophy and chronic ischemic Kehm matter disease without acute intracranial abnormality and multilevel degenerative disc disease with no acute abnormality in the cervical spine.  CT chest with marked severity bilateral peripheral infiltrates with area of right lower lobe masslike consolidation consistent with pneumonia versus atelectasis with small right pleural effusion.  Hospitalist service consulted for further evaluation of acute hypoxic respite failure secondary to Covid-19 viral pneumonia.   Assessment & Plan:   Principal Problem:   Acute respiratory  failure due to COVID-19 [facility]) Active Problems:   Complex partial seizure ([place])   Essential hypertension   Acute urinary retention   Acute metabolic encephalopathy   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HCC)   COPD with emphysema (HCC)   Acute hypoxic respiratory failure secondary to acute Covid-19 viral pneumonia during the ongoing Covid 19 Pandemic - POA Patient presents with several week history of progressive weakness, shortness of breath.  Patient is unvaccinated.  On arrival to the ED, patient with significant hypoxia with SPO2 in the 80s on room air.  Patient was found to have multifocal infiltrates on CT chest.  Covid-19 PCR positive.  --COVID test: + PCR 06/20/20 --CRP 2.0>0.9>0.6>0.6 --ddimer 3.76>2.65>1.86>1.82 --Remdesivir, plan 5-day course (Day #5/5) --Baricitinib 4mg  PO daily (Day #5/14) --IV Solumedrol now transitioned to prednisone 50mg  PO daily 3/21 --prone for 2-3hrs every 12hrs if able --Continue supplemental oxygen, titrate to maintain SPO2 greater than 88%, on 2L [place] w/ SpO2 93% at rest, but does desaturate with movement --Combivent MDI 1 puff every 6 hours --Continue supportive care with vitamin C, zinc, Tylenol, antitussives  --Follow CBC, CMP, D-dimer, and CRP daily --Continue airborne/contact isolation precautions   The treatment plan and use of medications and known side effects were discussed with patient/family. Some of the medications used are based on case reports/anecdotal data.  All other medications being used in the management of COVID-19 based on limited study data.  Complete risks and long-term side effects are unknown, however in the best clinical judgment they seem to be of some benefit.  Patient wanted to proceed with treatment options provided.  Essential hypertension --Amlodipine 5 mg p.o. daily  COPD Lung nodules Not oxygen dependent at baseline.  Recently diagnosed  with spiculated lung nodule right lower lobe measuring 2.6 x 2.6 cm, spiculated  nodule medial right lower lobe measuring 1.6 x 1.5 cm without evidence of hilar/mediastinal lymphadenopathy.  Is currently being followed by pulmonary outpatient, Dr. Valeta Harms.  Pending PET CT and CT super D chest without contrast. --Outpatient follow-up with pulmonology   DVT prophylaxis: Lovenox   Code Status: Full Code Family Communication: Updated patients daughter Terri via telephone this am.  Disposition Plan:  Level of care: Med-Surg Status is: Inpatient  Remains inpatient appropriate because:Ongoing diagnostic testing needed not appropriate for outpatient work up, Unsafe d/c plan, IV treatments appropriate due to intensity of illness or inability to take PO and Inpatient level of care appropriate due to severity of illness   Dispo: The patient is from: Home              Anticipated d/c is to: Home              Patient currently is not medically stable to d/c.   Difficult to place patient No  Consultants:   none  Procedures:   none  Antimicrobials:   none   Subjective: Patient seen and examined bedside, resting comfortably.  No family present.  Patient reports continued fatigue and weakness, otherwise no other concerns this morning.  Continues on 2 L nasal cannula.  No other questions or concerns at this time.  Denies headache, no visual changes, no chest pain, no palpitations, no abdominal pain.  No acute concerns overnight per nursing staff.  Objective: Vitals:   06/24/20 1742 06/24/20 2055 06/25/20 0329 06/25/20 0710  BP:  (!) 143/71 (!) 154/64   Pulse:  83 70   Resp:  20 20   Temp: 98.3 F (36.8 C) 97.9 F (36.6 C) 98 F (36.7 C)   TempSrc:  Oral Oral   SpO2:  91% (!) 87% 93%  Weight:      Height:        Intake/Output Summary (Last 24 hours) at 06/25/2020 1219 Last data filed at 06/25/2020 0359 Gross per 24 hour  Intake 328.4 ml  Output 470 ml  Net -141.6 ml   Filed Weights   06/21/20 1514  Weight: 46.4 kg    Examination:  General exam: Appears  calm and comfortable, ill in appearance Respiratory system: Clear to auscultation. Respiratory effort normal.  On 2 L nasal cannula SPO2 95% Cardiovascular system: S1 & S2 heard, RRR. No JVD, murmurs, rubs, gallops or clicks. No pedal edema. Gastrointestinal system: Abdomen is nondistended, soft and nontender. No organomegaly or masses felt. Normal bowel sounds heard. Central nervous system: Alert and oriented. No focal neurological deficits. Extremities: Symmetric 5 x 5 power. Skin: No rashes, lesions or ulcers Psychiatry: Judgement and insight appear poor. Mood & affect appropriate.     Data Reviewed: I have personally reviewed following labs and imaging studies  CBC: Recent Labs  Lab 06/20/20 1600 06/22/20 0422 06/23/20 0416 06/24/20 0436 06/25/20 0440  WBC 3.5* 4.9 6.2 6.0 7.9  NEUTROABS 2.4 4.1 5.0 4.8 5.7  HGB 14.9 13.9 14.1 13.9 13.4  HCT 45.1 41.7 43.2 42.0 41.0  MCV 89.7 89.3 89.3 89.6 88.9  PLT 314 347 411* 391 665   Basic Metabolic Panel: Recent Labs  Lab 06/20/20 1600 06/22/20 0422 06/23/20 0416 06/24/20 0436 06/25/20 0440  NA 132* 138 138 135 136  K 3.8 3.9 3.8 3.9 3.6  CL 100 107 104 102 103  CO2 23 24 25 26 26   GLUCOSE 88 136*  141* 141* 117*  BUN 15 16 18 23  25*  CREATININE 0.66 0.46 0.47 0.66 0.52  CALCIUM 8.2* 7.7* 8.0* 7.8* 7.7*  MG 2.1 2.0 2.2 2.2 2.2   GFR: Estimated Creatinine Clearance: 33.8 mL/min (by C-G formula based on SCr of 0.52 mg/dL). Liver Function Tests: Recent Labs  Lab 06/20/20 1600 06/22/20 0422 06/23/20 0416 06/24/20 0436 06/25/20 0440  AST 59* 37 36 36 44*  ALT 34 26 26 29  36  ALKPHOS 87 69 67 68 66  BILITOT 1.5* 0.6 0.7 0.7 0.9  PROT 6.3* 5.2* 5.1* 4.9* 4.6*  ALBUMIN 3.1* 2.5* 2.5* 2.4* 2.4*   No results for input(s): LIPASE, AMYLASE in the last 168 hours. No results for input(s): AMMONIA in the last 168 hours. Coagulation Profile: Recent Labs  Lab 06/20/20 1600  INR 0.9   Cardiac Enzymes: No results for  input(s): CKTOTAL, CKMB, CKMBINDEX, TROPONINI in the last 168 hours. BNP (last 3 results) No results for input(s): PROBNP in the last 8760 hours. HbA1C: No results for input(s): HGBA1C in the last 72 hours. CBG: Recent Labs  Lab 06/20/20 1720  GLUCAP 82   Lipid Profile: No results for input(s): CHOL, HDL, LDLCALC, TRIG, CHOLHDL, LDLDIRECT in the last 72 hours. Thyroid Function Tests: No results for input(s): TSH, T4TOTAL, FREET4, T3FREE, THYROIDAB in the last 72 hours. Anemia Panel: No results for input(s): VITAMINB12, FOLATE, FERRITIN, TIBC, IRON, RETICCTPCT in the last 72 hours. Sepsis Labs: Recent Labs  Lab 06/20/20 1600  PROCALCITON <0.10  LATICACIDVEN 1.0    Recent Results (from the past 240 hour(s))  Blood Culture (routine x 2)     Status: None (Preliminary result)   Collection Time: 06/20/20  3:57 PM   Specimen: BLOOD  Result Value Ref Range Status   Specimen Description   Final    BLOOD SITE NOT SPECIFIED Performed at [facility], [address] [place], [place] [address]    Special Requests   Final    BOTTLES DRAWN AEROBIC AND ANAEROBIC Blood Culture adequate volume Performed at [facility] [address], [place], [place] [address]    Culture   Final    NO GROWTH 4 DAYS Performed at [facility], [place] [address], [place], [place] [address]    Report Status PENDING  Incomplete  Resp Panel by RT-PCR (Flu A&B, Covid) Nasopharyngeal Swab     Status: Abnormal   Collection Time: 06/20/20  5:23 PM   Specimen: Nasopharyngeal Swab; Nasopharyngeal(NP) swabs in vial transport medium  Result Value Ref Range Status   SARS Coronavirus 2 by RT PCR POSITIVE (A) NEGATIVE Final    Comment: RESULT CALLED TO, READ BACK BY AND VERIFIED WITH: BROOKS B. 03.17.22 @ 2144 BY MECAIL J. (NOTE) SARS-CoV-2 target nucleic acids are DETECTED.  The SARS-CoV-2 RNA is generally detectable in upper respiratory specimens during the acute phase of  infection. Positive results are indicative of the presence of the identified virus, but do not rule out bacterial infection or co-infection with other pathogens not detected by the test. Clinical correlation with patient history and other diagnostic information is necessary to determine patient infection status. The expected result is Negative.  Fact Sheet for Patients: EntrepreneurPulse.com.au  Fact Sheet for Healthcare Providers: IncredibleEmployment.be  This test is not yet approved or cleared by the [place] FDA and  has been authorized for detection and/or diagnosis of SARS-CoV-2 by FDA under an Emergency Use Authorization (EUA).  This EUA will remain in effect (meaning  this test c an be used) for the duration of  the COVID-19 declaration under Section 564(b)(1) of the Act, 21 U.S.C. section 360bbb-3(b)(1), unless the authorization is terminated or revoked sooner.     Influenza A by PCR NEGATIVE NEGATIVE Final   Influenza B by PCR NEGATIVE NEGATIVE Final    Comment: (NOTE) The Xpert Xpress SARS-CoV-2/FLU/RSV plus assay is intended as an aid in the diagnosis of influenza from Nasopharyngeal swab specimens and should not be used as a sole basis for treatment. Nasal washings and aspirates are unacceptable for Xpert Xpress SARS-CoV-2/FLU/RSV testing.  Fact Sheet for Patients: EntrepreneurPulse.com.au  Fact Sheet for Healthcare Providers: IncredibleEmployment.be  This test is not yet approved or cleared by the [place] FDA and has been authorized for detection and/or diagnosis of SARS-CoV-2 by FDA under an Emergency Use Authorization (EUA). This EUA will remain in effect (meaning this test can be used) for the duration of the COVID-19 declaration under Section 564(b)(1) of the Act, 21 U.S.C. section 360bbb-3(b)(1), unless the authorization is terminated or revoked.  Performed at [facility], [place] [address], [place], [place] [address]   Blood Culture (routine x 2)     Status: None (Preliminary result)   Collection Time: 06/20/20  8:25 PM   Specimen: BLOOD RIGHT HAND  Result Value Ref Range Status   Specimen Description   Final    BLOOD RIGHT HAND Performed at [facility] [address], [place], [place] [address]    Special Requests   Final    BOTTLES DRAWN AEROBIC AND ANAEROBIC Blood Culture results may not be optimal due to an inadequate volume of blood received in culture bottles Performed at [facility] [address], [place], [place] [address]    Culture   Final    NO GROWTH 4 DAYS Performed at [facility], [place] [address] [place],  [address]    Report Status PENDING  Incomplete         Radiology Studies: No results found.      Scheduled Meds: . amLODipine  5 mg Oral Daily  . vitamin C  500 mg Oral Daily  . baricitinib  4 mg Oral Daily  . enoxaparin (LOVENOX) injection  40 mg Subcutaneous Q24H  . Ipratropium-Albuterol  1 puff Inhalation BID  . loratadine  10 mg Oral Daily  . predniSONE  50 mg Oral Daily  . zinc sulfate  220 mg Oral Daily   Continuous Infusions:    LOS: 4 days    Time spent: 36 minutes spent on chart review, discussion with nursing staff, consultants, updating family and interview/physical exam; more than 50% of that time was spent in counseling and/or coordination of care.    Zayyan Mullen J [place] DO [facility] Available via Epic secure chat 7am-7pm After these hours, please refer to coverage provider listed on [email or web address] 06/25/2020, 12:19 PM

## 2020-06-25 NOTE — Plan of Care (Signed)

## 2020-06-26 ENCOUNTER — Inpatient Hospital Stay (HOSPITAL_COMMUNITY): Admission: RE | Admit: 2020-06-26 | Payer: Medicare HMO | Source: Ambulatory Visit

## 2020-06-26 DIAGNOSIS — J9600 Acute respiratory failure, unspecified whether with hypoxia or hypercapnia: Secondary | ICD-10-CM | POA: Diagnosis not present

## 2020-06-26 DIAGNOSIS — U071 COVID-19: Secondary | ICD-10-CM | POA: Diagnosis not present

## 2020-06-26 LAB — CULTURE, BLOOD (ROUTINE X 2)
Culture: NO GROWTH
Culture: NO GROWTH
Special Requests: ADEQUATE

## 2020-06-26 MED ORDER — MUSCLE RUB 10-15 % EX CREA
1.0000 "application " | TOPICAL_CREAM | CUTANEOUS | Status: DC | PRN
  Administered 2020-06-26 – 2020-06-30 (×4): 1 via TOPICAL
  Filled 2020-06-26: qty 85

## 2020-06-26 NOTE — Plan of Care (Signed)

## 2020-06-26 NOTE — Progress Notes (Signed)
PROGRESS NOTE    Melanie Walker  WNI:[id number] DOB: Feb 03, 1931 DOA: 06/20/2020 PCP: Lajean Manes, MD    Brief Narrative:  Melanie Walker is an 87 year old female with past medical history significant for COPD, essential hypertension who presented to the ED with progressive generalized weakness, shortness of breath with multiple falls and confusion.  Patient is a poor historian from suspect encephalopathy and history obtained from both the patient and daughter who is present at bedside as well as discussions with emergency department staff. Dyspnea worse with exertion and improved with rest. Also patient with lower abdominal pain with difficulty urinating. Recently diagnosed with a urinary tract infection and completed course of ciprofloxacin. Although believe her symptoms are related to urinary retention. In the ED patient required supplemental oxygen up to 4 L nasal cannula with notable bilateral peripheral infiltrate in the right lower lobe most notably. Procalcitonin less than 0.10. SARS-CoV-2/Covid-19 PCR positive. Hospitalist service consulted for further evaluation of acute hypoxic respite failure secondary to Covid-19 viral pneumonia.  Assessment & Plan:   Principal Problem:   Acute respiratory failure due to COVID-19 [facility]) Active Problems:   Complex partial seizure ([place])   Essential hypertension   Acute urinary retention   Acute metabolic encephalopathy   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HCC)   COPD with emphysema (HCC)   Acute hypoxic respiratory failure secondary to acute Covid-19 viral pneumonia during the ongoing Covid 19 Pandemic - POA - Unvaccinated with notable covid + swab and hypoxia.  -Remdesivir completed -Baricitinib 4mg  PO daily (Day #5/14) - consider discontinuing prior to discharge given profound improvement in symptoms -IV Solumedrol now transitioned to prednisone 50mg  PO daily 3/21 - Continue precautions through the weekend Recent Labs    06/24/20 0436  06/25/20 0440  DDIMER 1.86* 1.82*  CRP 0.6 0.6   Essential hypertension -Amlodipine 5 mg p.o. daily  COPD Lung nodules Not oxygen dependent at baseline.  Recently diagnosed with spiculated lung nodule right lower lobe measuring 2.6 x 2.6 cm, spiculated nodule medial right lower lobe measuring 1.6 x 1.5 cm without evidence of hilar/mediastinal lymphadenopathy.  Is currently being followed by pulmonary outpatient, Dr. Valeta Harms.  Pending PET CT and CT super D chest without contrast. --Outpatient follow-up with pulmonology   DVT prophylaxis: Lovenox   Code Status: Full Code Family Communication: Updated patients daughter [name] previously - no new updates at this time.  Disposition Plan:  Level of care: Med-Surg Status is: Inpatient  Remains inpatient appropriate because:Ongoing diagnostic testing needed not appropriate for outpatient work up, Unsafe d/c plan, IV treatments appropriate due to intensity of illness or inability to take PO and Inpatient level of care appropriate due to severity of illness   Dispo: The patient is from: Home              Anticipated d/c is to: Home              Patient currently is not medically stable to d/c.   Difficult to place patient No  Consultants:   none  Procedures:   none  Antimicrobials:   none   Subjective: No acute issues or events overnight, only complaint from patient is poor sleep hygiene in the setting of "multiple people waking up overnight" but otherwise denies chest pain shortness of breath nausea vomiting diarrhea constipation headache fevers or chills.  Objective: Vitals:   06/25/20 0710 06/25/20 1441 06/25/20 2034 06/26/20 0428  BP:  (!) 147/62 (!) 126/57 (!) 157/80  Pulse:  100 78 71  Resp:  (!) 23 (!) 21 17  Temp:  98.3 F (36.8 C) 98.4 F (36.9 C) (!) 97.5 F (36.4 C)  TempSrc:  Oral Oral Oral  SpO2: 93% 90% 92% 91%  Weight:      Height:        Intake/Output Summary (Last 24 hours) at 06/26/2020 0741 Last  data filed at 06/26/2020 0200 Gross per 24 hour  Intake 562 ml  Output 650 ml  Net -88 ml   Filed Weights   06/21/20 1514  Weight: 46.4 kg    Examination:  General exam: Appears calm and comfortable, ill in appearance Respiratory system: Clear to auscultation. Respiratory effort normal.  On 2 L nasal cannula SPO2 95% Cardiovascular system: S1 & S2 heard, RRR. No JVD, murmurs, rubs, gallops or clicks. No pedal edema. Gastrointestinal system: Abdomen is nondistended, soft and nontender. No organomegaly or masses felt. Normal bowel sounds heard. Central nervous system: Alert and oriented. No focal neurological deficits. Extremities: Symmetric 5 x 5 power. Skin: No rashes, lesions or ulcers  Data Reviewed: I have personally reviewed following labs and imaging studies  CBC: Recent Labs  Lab 06/20/20 1600 06/22/20 0422 06/23/20 0416 06/24/20 0436 06/25/20 0440  WBC 3.5* 4.9 6.2 6.0 7.9  NEUTROABS 2.4 4.1 5.0 4.8 5.7  HGB 14.9 13.9 14.1 13.9 13.4  HCT 45.1 41.7 43.2 42.0 41.0  MCV 89.7 89.3 89.3 89.6 88.9  PLT 314 347 411* 391 062   Basic Metabolic Panel: Recent Labs  Lab 06/20/20 1600 06/22/20 0422 06/23/20 0416 06/24/20 0436 06/25/20 0440  NA 132* 138 138 135 136  K 3.8 3.9 3.8 3.9 3.6  CL 100 107 104 102 103  CO2 23 24 25 26 26   GLUCOSE 88 136* 141* 141* 117*  BUN 15 16 18 23  25*  CREATININE 0.66 0.46 0.47 0.66 0.52  CALCIUM 8.2* 7.7* 8.0* 7.8* 7.7*  MG 2.1 2.0 2.2 2.2 2.2   GFR: Estimated Creatinine Clearance: 33.8 mL/min (by C-G formula based on SCr of 0.52 mg/dL). Liver Function Tests: Recent Labs  Lab 06/20/20 1600 06/22/20 0422 06/23/20 0416 06/24/20 0436 06/25/20 0440  AST 59* 37 36 36 44*  ALT 34 26 26 29  36  ALKPHOS 87 69 67 68 66  BILITOT 1.5* 0.6 0.7 0.7 0.9  PROT 6.3* 5.2* 5.1* 4.9* 4.6*  ALBUMIN 3.1* 2.5* 2.5* 2.4* 2.4*   No results for input(s): LIPASE, AMYLASE in the last 168 hours. No results for input(s): AMMONIA in the last 168  hours. Coagulation Profile: Recent Labs  Lab 06/20/20 1600  INR 0.9   Cardiac Enzymes: No results for input(s): CKTOTAL, CKMB, CKMBINDEX, TROPONINI in the last 168 hours. BNP (last 3 results) No results for input(s): PROBNP in the last 8760 hours. HbA1C: No results for input(s): HGBA1C in the last 72 hours. CBG: Recent Labs  Lab 06/20/20 1720  GLUCAP 82   Lipid Profile: No results for input(s): CHOL, HDL, LDLCALC, TRIG, CHOLHDL, LDLDIRECT in the last 72 hours. Thyroid Function Tests: No results for input(s): TSH, T4TOTAL, FREET4, T3FREE, THYROIDAB in the last 72 hours. Anemia Panel: No results for input(s): VITAMINB12, FOLATE, FERRITIN, TIBC, IRON, RETICCTPCT in the last 72 hours. Sepsis Labs: Recent Labs  Lab 06/20/20 1600  PROCALCITON <0.10  LATICACIDVEN 1.0    Recent Results (from the past 240 hour(s))  Blood Culture (routine x 2)     Status: None   Collection Time: 06/20/20  3:57 PM   Specimen: BLOOD  Result Value Ref Range  Status   Specimen Description   Final    BLOOD SITE NOT SPECIFIED Performed at [facility] [address], [place], [place] [address]    Special Requests   Final    BOTTLES DRAWN AEROBIC AND ANAEROBIC Blood Culture adequate volume Performed at [facility] [address] [place], [place] [address]    Culture   Final    NO GROWTH 5 DAYS Performed at [facility], [place] [address], [place], [place] [address]    Report Status 06/26/2020 FINAL  Final  Resp Panel by RT-PCR (Flu A&B, Covid) Nasopharyngeal Swab     Status: Abnormal   Collection Time: 06/20/20  5:23 PM   Specimen: Nasopharyngeal Swab; Nasopharyngeal(NP) swabs in vial transport medium  Result Value Ref Range Status   SARS Coronavirus 2 by RT PCR POSITIVE (A) NEGATIVE Final    Comment: RESULT CALLED TO, READ BACK BY AND VERIFIED WITH: BROOKS B. 03.17.22 @ 2144 BY MECAIL J. (NOTE) SARS-CoV-2 target nucleic acids are DETECTED.  The SARS-CoV-2  RNA is generally detectable in upper respiratory specimens during the acute phase of infection. Positive results are indicative of the presence of the identified virus, but do not rule out bacterial infection or co-infection with other pathogens not detected by the test. Clinical correlation with patient history and other diagnostic information is necessary to determine patient infection status. The expected result is Negative.  Fact Sheet for Patients: EntrepreneurPulse.com.au  Fact Sheet for Healthcare Providers: IncredibleEmployment.be  This test is not yet approved or cleared by the [place] FDA and  has been authorized for detection and/or diagnosis of SARS-CoV-2 by FDA under an Emergency Use Authorization (EUA).  This EUA will remain in effect (meaning this test c an be used) for the duration of  the COVID-19 declaration under Section 564(b)(1) of the Act, 21 U.S.C. section 360bbb-3(b)(1), unless the authorization is terminated or revoked sooner.     Influenza A by PCR NEGATIVE NEGATIVE Final   Influenza B by PCR NEGATIVE NEGATIVE Final    Comment: (NOTE) The Xpert Xpress SARS-CoV-2/FLU/RSV plus assay is intended as an aid in the diagnosis of influenza from Nasopharyngeal swab specimens and should not be used as a sole basis for treatment. Nasal washings and aspirates are unacceptable for Xpert Xpress SARS-CoV-2/FLU/RSV testing.  Fact Sheet for Patients: EntrepreneurPulse.com.au  Fact Sheet for Healthcare Providers: IncredibleEmployment.be  This test is not yet approved or cleared by the [place] FDA and has been authorized for detection and/or diagnosis of SARS-CoV-2 by FDA under an Emergency Use Authorization (EUA). This EUA will remain in effect (meaning this test can be used) for the duration of the COVID-19 declaration under Section 564(b)(1) of the Act, 21 U.S.C. section  360bbb-3(b)(1), unless the authorization is terminated or revoked.  Performed at [facility], [place] [address] [place], [place] [address]   Blood Culture (routine x 2)     Status: None   Collection Time: 06/20/20  8:25 PM   Specimen: BLOOD RIGHT HAND  Result Value Ref Range Status   Specimen Description   Final    BLOOD RIGHT HAND Performed at [facility] [address], [place], [place] [address]    Special Requests   Final    BOTTLES DRAWN AEROBIC AND ANAEROBIC Blood Culture results may not be optimal due to an inadequate volume of blood received in culture bottles Performed at [facility] [address], [place], [place] [address]  Culture   Final    NO GROWTH 5 DAYS Performed at [facility], [place] [address] [place], [place] [address]    Report Status 06/26/2020 FINAL  Final   Radiology Studies: No results found.  Scheduled Meds: . amLODipine  5 mg Oral Daily  . vitamin C  500 mg Oral Daily  . baricitinib  4 mg Oral Daily  . enoxaparin (LOVENOX) injection  40 mg Subcutaneous Q24H  . Ipratropium-Albuterol  1 puff Inhalation BID  . lactobacillus acidophilus & bulgar  1 tablet Oral TID WC  . loratadine  10 mg Oral Daily  . predniSONE  50 mg Oral Daily  . zinc sulfate  220 mg Oral Daily   Continuous Infusions:   LOS: 5 days   Time spent: 40 min  Little Ishikawa, DO  [facility] Available via Epic secure chat 7am-7pm After these hours, please refer to coverage provider listed on [email or web address] 06/26/2020, 7:41 AM

## 2020-06-26 NOTE — Progress Notes (Signed)
Occupational Therapy Treatment Patient Details Name: Melanie Walker MRN: [id number] DOB: [birth year] Today's Date: 06/26/2020    History of present illness 87 year old female with past medical history significant for COPD, essential hypertension who presented to the ED with progressive generalized weakness, shortness of breath with multiple falls and confusion.  Patient is a poor historian from suspect encephalopathy and history obtained from both the patient and daughter who is present at bedside as well as discussions with emergency department staff.  Dyspnea worse with exertion and improved with rest.  Also patient with lower abdominal pain with difficulty urinating.  Recently diagnosed with a urinary tract infection and completed course of ciprofloxacin.  Although believe her symptoms are related to urinary retention.  Pt tested positive for COVID.   OT comments  Treatment focused on encouraging and educating patient on need to maintain mobility and getting out of bed, self care task, and breathing techniques. Patient on 2L [place] when therapist entered the room. Patient min assist to transfer to side of bed, min guard for transfer to [facility]. Patient max assist for clothing management with toileting but able to clean herself in seated position. Patient then transferred to recliner - holding onto furniture and not the walker. Patient's o2 sat dropped to 88% but recovered quickly. Patient provided with education to stay out of bed and maintain seated position, to perform deep breathing and use IS correctly. Patient performed 5 reps with therapist watching. Patient and daughter verbalized understanding. Cont POC.   Follow Up Recommendations  SNF    Equipment Recommendations       Recommendations for Other Services      Precautions / Restrictions Precautions Precautions: Fall Precaution Comments: monitor O2 Restrictions Weight Bearing Restrictions: No       Mobility Bed Mobility Overal bed  mobility: Needs Assistance Bed Mobility: Supine to Sit     Supine to sit: HOB elevated;Min assist     General bed mobility comments: min assist for scooting to edge of bed.    Transfers Overall transfer level: Needs assistance Equipment used: None Transfers: Sit to/from [facility] Sit to Stand: Min guard Stand pivot transfers: Min guard       General transfer comment: min guard for standing and transferring to [facility] and then to recliner. Patient using bilateral hands on bed, BSc and chair to transfer herself. Sat dropped 88% with activity but recovered with cues to deep breathe. Patient on 2L [place].    Balance Overall balance assessment: Needs assistance Sitting-balance support: Bilateral upper extremity supported Sitting balance-Leahy Scale: Fair Sitting balance - Comments: leaning on walk while sitting on BSC for support not for balance   Standing balance support: Bilateral upper extremity supported Standing balance-Leahy Scale: Poor Standing balance comment: reliant on UE                           ADL either performed or assessed with clinical judgement   ADL                       Lower Body Dressing: Bed level;Set up Lower Body Dressing Details (indicate cue type and reason): able to don and doff socks in bed. Toilet Transfer: BSC;Stand-pivot;Min Psychiatric nurse Details (indicate cue type and reason): pivoted to bsc with min guard  - bsc placed on her right. Toileting- Clothing Manipulation and Hygiene: Maximal assistance;Sitting/lateral lean;Sit to/from stand Toileting - Clothing Manipulation Details (indicate cue type and  reason): patient able to wipe self in seated position but needed assistance managing clothing (underwear) before and after.             Vision   Vision Assessment?: No apparent visual deficits   Perception     Praxis      Cognition Arousal/Alertness: Awake/alert Behavior During Therapy: WFL for  tasks assessed/performed Overall Cognitive Status: Within Functional Limits for tasks assessed                                          Exercises Other Exercises Other Exercises: encouarged use of IS x 10 every hour, patient performed 5 reps for therapist.   Shoulder Instructions       General Comments      Pertinent Vitals/ Pain          Home Living                                          Prior Functioning/Environment              Frequency  Min 2X/week        Progress Toward Goals  OT Goals(current goals can now be found in the care plan section)  Progress towards OT goals: Progressing toward goals  Acute Rehab OT Goals Patient Stated Goal: Daughter: For pt to go to Rehab. OT Goal Formulation: With patient/family Time For Goal Achievement: 07/07/20 Potential to Achieve Goals: Good  Plan Discharge plan remains appropriate    Co-evaluation                 AM-PAC OT "6 Clicks" Daily Activity     Outcome Measure   Help from another person eating meals?: A Little Help from another person taking care of personal grooming?: A Little Help from another person toileting, which includes using toliet, bedpan, or urinal?: A Lot Help from another person bathing (including washing, rinsing, drying)?: A Little Help from another person to put on and taking off regular upper body clothing?: A Little Help from another person to put on and taking off regular lower body clothing?: A Little 6 Click Score: 17    End of Session Equipment Utilized During Treatment: Oxygen  OT Visit Diagnosis: Unsteadiness on feet (R26.81);Pain;History of falling (Z91.81);Repeated falls (R29.6);Other symptoms and signs involving the nervous system (R29.898)   Activity Tolerance Patient limited by fatigue   Patient Left in chair;with call bell/phone within reach;with family/visitor present   Nurse Communication Mobility status        Time:  5188-4166 OT Time Calculation (min): 27 min  Charges: OT General Charges $OT Visit: 1 Visit OT Treatments $Self Care/Home Management : 8-22 mins $Therapeutic Activity: 8-22 mins  Eliyanna Ault, OTR/L [place]  Office [phone] Pager: [place] 06/26/2020, 3:30 PM

## 2020-06-26 NOTE — Plan of Care (Signed)
  Problem: Education: Goal: Knowledge of General Education information will improve Description: Including pain rating scale, medication(s)/side effects and non-pharmacologic comfort measures 06/26/2020 2248 by Micheline Chapman, RN Outcome: Progressing 06/26/2020 2247 by Micheline Chapman, RN Outcome: Progressing   Problem: Health Behavior/Discharge Planning: Goal: Ability to manage health-related needs will improve 06/26/2020 2248 by Micheline Chapman, RN Outcome: Progressing 06/26/2020 2247 by Micheline Chapman, RN Outcome: Progressing   Problem: Clinical Measurements: Goal: Ability to maintain clinical measurements within normal limits will improve 06/26/2020 2248 by Micheline Chapman, RN Outcome: Progressing 06/26/2020 2247 by Micheline Chapman, RN Outcome: Progressing Goal: Will remain free from infection 06/26/2020 2248 by Micheline Chapman, RN Outcome: Progressing 06/26/2020 2247 by Micheline Chapman, RN Outcome: Progressing Goal: Diagnostic test results will improve 06/26/2020 2248 by Micheline Chapman, RN Outcome: Progressing 06/26/2020 2247 by Micheline Chapman, RN Outcome: Progressing Goal: Respiratory complications will improve 06/26/2020 2248 by Micheline Chapman, RN Outcome: Progressing 06/26/2020 2247 by Micheline Chapman, RN Outcome: Progressing Goal: Cardiovascular complication will be avoided 06/26/2020 2248 by Micheline Chapman, RN Outcome: Progressing 06/26/2020 2247 by Micheline Chapman, RN Outcome: Progressing   Problem: Activity: Goal: Risk for activity intolerance will decrease 06/26/2020 2248 by Micheline Chapman, RN Outcome: Progressing 06/26/2020 2247 by Micheline Chapman, RN Outcome: Progressing   Problem: Nutrition: Goal: Adequate nutrition will be maintained 06/26/2020 2248 by Micheline Chapman, RN Outcome: Progressing 06/26/2020 2247 by Micheline Chapman, RN Outcome: Progressing   Problem: Coping: Goal: Level of anxiety will decrease 06/26/2020 2248 by Micheline Chapman, RN Outcome:  Progressing 06/26/2020 2247 by Micheline Chapman, RN Outcome: Progressing   Problem: Elimination: Goal: Will not experience complications related to bowel motility 06/26/2020 2248 by Micheline Chapman, RN Outcome: Progressing 06/26/2020 2247 by Micheline Chapman, RN Outcome: Progressing Goal: Will not experience complications related to urinary retention 06/26/2020 2248 by Micheline Chapman, RN Outcome: Progressing 06/26/2020 2247 by Micheline Chapman, RN Outcome: Progressing   Problem: Pain Managment: Goal: General experience of comfort will improve 06/26/2020 2248 by Micheline Chapman, RN Outcome: Progressing 06/26/2020 2247 by Micheline Chapman, RN Outcome: Progressing   Problem: Safety: Goal: Ability to remain free from injury will improve 06/26/2020 2248 by Micheline Chapman, RN Outcome: Progressing 06/26/2020 2247 by Micheline Chapman, RN Outcome: Progressing   Problem: Skin Integrity: Goal: Risk for impaired skin integrity will decrease 06/26/2020 2248 by Micheline Chapman, RN Outcome: Progressing 06/26/2020 2247 by Micheline Chapman, RN Outcome: Progressing   Problem: Education: Goal: Knowledge of risk factors and measures for prevention of condition will improve Outcome: Progressing   Problem: Coping: Goal: Psychosocial and spiritual needs will be supported Outcome: Progressing   Problem: Respiratory: Goal: Will maintain a patent airway Outcome: Progressing Goal: Complications related to the disease process, condition or treatment will be avoided or minimized Outcome: Progressing

## 2020-06-27 DIAGNOSIS — J9600 Acute respiratory failure, unspecified whether with hypoxia or hypercapnia: Secondary | ICD-10-CM | POA: Diagnosis not present

## 2020-06-27 DIAGNOSIS — U071 COVID-19: Secondary | ICD-10-CM | POA: Diagnosis not present

## 2020-06-27 MED ORDER — PREDNISONE 20 MG PO TABS
20.0000 mg | ORAL_TABLET | Freq: Every day | ORAL | Status: AC
  Administered 2020-06-28 – 2020-06-30 (×3): 20 mg via ORAL
  Filled 2020-06-27 (×3): qty 1

## 2020-06-27 NOTE — Progress Notes (Signed)
Physical Therapy Treatment Patient Details Name: Melanie Walker MRN: [id number] DOB: [birth year] Today's Date: 06/27/2020    History of Present Illness 87 year old female with past medical history significant for COPD, essential hypertension who presented to the ED with progressive generalized weakness, shortness of breath with multiple falls and confusion.  Patient is a poor historian from suspect encephalopathy and history obtained from both the patient and daughter who is present at bedside as well as discussions with emergency department staff.  Dyspnea worse with exertion and improved with rest.  Also patient with lower abdominal pain with difficulty urinating.  Recently diagnosed with a urinary tract infection and completed course of ciprofloxacin.  Although believe her symptoms are related to urinary retention.  Pt tested positive for COVID.    PT Comments    Pt requests to use BSC upon arrival, requires assist to come to sitting EOB and cues for hand placement for stand pivot transfers to [facility]. Pt attempts to lean trunk forward back onto bed instead of stand pivot, educated pt on safety and pt verbalizes understanding, able to perform stand pivot back to bed with hands on bed/BSC to steady and min guard from therapist. Pt declines ambulation or sitting up in chair despite encouragement and education, but agreeable to exercise while supine in bed. Pt tolerates 3 exercises, then requests to be finished due to fatigue. Therapist educated pt on benefit of being up in chair and OOB, exercise, and ambulation and pt verbalizes understanding. Pt on 2L O2 and desats to 87% with transfers, able to recover to 90% within 1 min of pursed lip breathing; pt 94% at EOS while supine in bed- RN notified. Continue to progress acute PT as able.   Follow Up Recommendations  SNF     Equipment Recommendations  None recommended by PT    Recommendations for Other Services       Precautions / Restrictions  Precautions Precautions: Fall Precaution Comments: monitor O2 Restrictions Weight Bearing Restrictions: No    Mobility  Bed Mobility Overal bed mobility: Needs Assistance Bed Mobility: Supine to Sit;Sit to Supine  Supine to sit: Min assist;HOB elevated Sit to supine: Min guard   General bed mobility comments: min assist with use of bedrail and HOB elevated to upright trunk, min guard to return to supine and therapist assists with linen    Transfers Overall transfer level: Needs assistance Equipment used: None Transfers: Sit to/from Stand;Stand Pivot Transfers Sit to Stand: Min guard Stand pivot transfers: Min guard    General transfer comment: min guard to steady with rising to stand at bedside, min guard to steady with transfer to [facility] and pt's hands on bed and BSC arm to steady self; pt fatigues with transfer and attempts to lean trunk back into bed from [facility] instead of stand pivot, educated by therapist on safety and pt able to perform with cues  Ambulation/Gait  General Gait Details: pt declines   Stairs    Wheelchair Mobility    Modified Rankin (Stroke Patients Only)       Balance Overall balance assessment: Needs assistance Sitting-balance support: Bilateral upper extremity supported Sitting balance-Leahy Scale: Fair Sitting balance - Comments: seated EOB   Standing balance support: Bilateral upper extremity supported Standing balance-Leahy Scale: Poor Standing balance comment: reliant on UE     Cognition Arousal/Alertness: Awake/alert Behavior During Therapy: WFL for tasks assessed/performed;Impulsive Overall Cognitive Status: No family/caregiver present to determine baseline cognitive functioning Area of Impairment: Safety/judgement  Safety/Judgement: Decreased awareness of safety  General Comments: Pt slightly impulsive with return to bed, cues for hand placement and safety      Exercises General Exercises - Lower Extremity Ankle Circles/Pumps:  Supine;AROM;Strengthening;Both;10 reps Heel Slides: Supine;AROM;Strengthening;Both;10 reps Straight Leg Raises: Supine;AROM;Strengthening;Both;10 reps    General Comments General comments (skin integrity, edema, etc.): Pt on 2L O2, desats to 87% with BSC transfer, recovers within 1 minute of rest and pursed lip breathing      Pertinent Vitals/Pain Pain Assessment: No/denies pain    Home Living                      Prior Function            PT Goals (current goals can now be found in the care plan section) Acute Rehab PT Goals Patient Stated Goal: Daughter: For pt to go to Rehab. PT Goal Formulation: With family Time For Goal Achievement: 07/07/20 Potential to Achieve Goals: Good Progress towards PT goals: Progressing toward goals    Frequency    Min 2X/week      PT Plan Current plan remains appropriate    Co-evaluation              AM-PAC PT "6 Clicks" Mobility   Outcome Measure  Help needed turning from your back to your side while in a flat bed without using bedrails?: A Little Help needed moving from lying on your back to sitting on the side of a flat bed without using bedrails?: A Little Help needed moving to and from a bed to a chair (including a wheelchair)?: A Little Help needed standing up from a chair using your arms (e.g., wheelchair or bedside chair)?: A Little Help needed to walk in hospital room?: A Little Help needed climbing 3-5 steps with a railing? : A Lot 6 Click Score: 17    End of Session Equipment Utilized During Treatment: Oxygen Activity Tolerance: Patient limited by fatigue Patient left: with call bell/phone within reach;in bed;with bed alarm set Nurse Communication: Mobility status PT Visit Diagnosis: Other abnormalities of gait and mobility (R26.89)     Time: 1452-1510 PT Time Calculation (min) (ACUTE ONLY): 18 min  Charges:  $Therapeutic Activity: 8-22 mins                      Tori Broussard PT, DPT 06/27/20,  3:54 PM

## 2020-06-27 NOTE — Plan of Care (Signed)

## 2020-06-27 NOTE — Progress Notes (Signed)
After medicines have been punched out of packet, patient endorses I would like to wait until after breakfast. Patient request that medicines be left on meal tray.

## 2020-06-27 NOTE — Progress Notes (Signed)
In to see patient, patient alert and oriented x 4. RR even and unlabored, on 2L of nasal cannula. Assisted to bedside commode. Call bell within reach, bed low and locked. No other needs identified. Will continue to monitor.

## 2020-06-27 NOTE — Progress Notes (Signed)
Shift Summary:   Remained alert and oriented x 4. Ate 100% of meals fine. RR remained even and unlabored. On 2L of nasal cannula. Patient requested tylenol. Daughter insisted that patient uses muscle rub instead of tylenol. Assisted to [facility] with one assist. Bed low and locked. Call bell within reach. Will continue to monitor.

## 2020-06-27 NOTE — Plan of Care (Signed)

## 2020-06-27 NOTE — Progress Notes (Signed)
PROGRESS NOTE    Melanie Walker  PYP:[id number] DOB: 17-Jul-1930 DOA: 06/20/2020 PCP: Lajean Manes, MD    Brief Narrative:  Melanie Walker is an 87 year old female with past medical history significant for COPD, essential hypertension who presented to the ED with progressive generalized weakness, shortness of breath with multiple falls and confusion.  Patient is a poor historian from suspect encephalopathy and history obtained from both the patient and daughter who is present at bedside as well as discussions with emergency department staff. Dyspnea worse with exertion and improved with rest. Also patient with lower abdominal pain with difficulty urinating. Recently diagnosed with a urinary tract infection and completed course of ciprofloxacin. Although believe her symptoms are related to urinary retention. In the ED patient required supplemental oxygen up to 4 L nasal cannula with notable bilateral peripheral infiltrate in the right lower lobe most notably. Procalcitonin less than 0.10. SARS-CoV-2/Covid-19 PCR positive. Hospitalist service consulted for further evaluation of acute hypoxic respite failure secondary to Covid-19 viral pneumonia.  Assessment & Plan:   Principal Problem:   Acute respiratory failure due to COVID-19 [facility]) Active Problems:   Complex partial seizure ([place])   Essential hypertension   Acute urinary retention   Acute metabolic encephalopathy   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HCC)   COPD with emphysema (HCC)   Acute hypoxic respiratory failure secondary to acute Covid-19 viral pneumonia during the ongoing Covid 19 Pandemic - POA - Unvaccinated with notable covid + swab and hypoxia.  - Remdesivir completed - Baricitinib 4mg  PO daily through 07/04/20 - consider discontinuing prior to discharge given profound improvement in symptoms - IV Solumedrol now transitioned to prednisone 20mg  PO daily - will taper at discharge - Continue precautions through the weekend Recent  Labs    06/25/20 0440  DDIMER 1.82*  CRP 0.6   Essential hypertension -Amlodipine 5 mg p.o. daily  COPD Lung nodules - Not oxygen dependent at baseline. - Recently diagnosed with spiculated lung nodule right lower lobe measuring 2.6 x 2.6 cm, spiculated nodule medial right lower lobe measuring 1.6 x 1.5 cm without evidence of hilar/mediastinal lymphadenopathy.  Is currently being followed by pulmonary outpatient, Dr. Valeta Harms.  Pending PET CT and CT super D chest without contrast. - Outpatient follow-up with pulmonology  DVT prophylaxis: Lovenox   Code Status: Full Code Family Communication: Terri at bedside  Disposition Plan:  Level of care: Med-Surg Status is: Inpatient  Remains inpatient appropriate because:Ongoing diagnostic testing needed not appropriate for outpatient work up, Unsafe d/c plan, IV treatments appropriate due to intensity of illness or inability to take PO and Inpatient level of care appropriate due to severity of illness   Dispo: The patient is from: Home              Anticipated d/c is to: Home              Patient currently is not medically stable to d/c.   Difficult to place patient No  Consultants:   none  Procedures:   none  Antimicrobials:   none   Subjective: No acute issues or events overnight, denies headache, fevers, chills - still very nervous about ambulation.  Objective: Vitals:   06/26/20 0428 06/26/20 1529 06/26/20 2044 06/27/20 0547  BP: (!) 157/80 (!) 130/57 130/60 (!) 144/70  Pulse: 71 91 71 72  Resp: 17 15 18  (!) 21  Temp: (!) 97.5 F (36.4 C) 98.2 F (36.8 C) 97.6 F (36.4 C) 97.6 F (36.4 C)  TempSrc:  Oral Oral Oral Oral  SpO2: 91% 94% 95% 93%  Weight:      Height:        Intake/Output Summary (Last 24 hours) at 06/27/2020 0744 Last data filed at 06/27/2020 0543 Gross per 24 hour  Intake 600 ml  Output 1050 ml  Net -450 ml   Filed Weights   06/21/20 1514  Weight: 46.4 kg    Examination:  General exam:  Appears calm and comfortable, ill in appearance Respiratory system: Clear to auscultation. Respiratory effort normal.  On 2 L nasal cannula SPO2 95% Cardiovascular system: S1 & S2 heard, RRR. No JVD, murmurs, rubs, gallops or clicks. No pedal edema. Gastrointestinal system: Abdomen is nondistended, soft and nontender. No organomegaly or masses felt. Normal bowel sounds heard. Central nervous system: Alert and oriented. No focal neurological deficits. Extremities: Symmetric 5 x 5 power. Skin: No rashes, lesions or ulcers  Data Reviewed: I have personally reviewed following labs and imaging studies  CBC: Recent Labs  Lab 06/20/20 1600 06/22/20 0422 06/23/20 0416 06/24/20 0436 06/25/20 0440  WBC 3.5* 4.9 6.2 6.0 7.9  NEUTROABS 2.4 4.1 5.0 4.8 5.7  HGB 14.9 13.9 14.1 13.9 13.4  HCT 45.1 41.7 43.2 42.0 41.0  MCV 89.7 89.3 89.3 89.6 88.9  PLT 314 347 411* 391 834   Basic Metabolic Panel: Recent Labs  Lab 06/20/20 1600 06/22/20 0422 06/23/20 0416 06/24/20 0436 06/25/20 0440  NA 132* 138 138 135 136  K 3.8 3.9 3.8 3.9 3.6  CL 100 107 104 102 103  CO2 23 24 25 26 26   GLUCOSE 88 136* 141* 141* 117*  BUN 15 16 18 23  25*  CREATININE 0.66 0.46 0.47 0.66 0.52  CALCIUM 8.2* 7.7* 8.0* 7.8* 7.7*  MG 2.1 2.0 2.2 2.2 2.2   GFR: Estimated Creatinine Clearance: 33.8 mL/min (by C-G formula based on SCr of 0.52 mg/dL). Liver Function Tests: Recent Labs  Lab 06/20/20 1600 06/22/20 0422 06/23/20 0416 06/24/20 0436 06/25/20 0440  AST 59* 37 36 36 44*  ALT 34 26 26 29  36  ALKPHOS 87 69 67 68 66  BILITOT 1.5* 0.6 0.7 0.7 0.9  PROT 6.3* 5.2* 5.1* 4.9* 4.6*  ALBUMIN 3.1* 2.5* 2.5* 2.4* 2.4*   No results for input(s): LIPASE, AMYLASE in the last 168 hours. No results for input(s): AMMONIA in the last 168 hours. Coagulation Profile: Recent Labs  Lab 06/20/20 1600  INR 0.9   Cardiac Enzymes: No results for input(s): CKTOTAL, CKMB, CKMBINDEX, TROPONINI in the last 168 hours. BNP  (last 3 results) No results for input(s): PROBNP in the last 8760 hours. HbA1C: No results for input(s): HGBA1C in the last 72 hours. CBG: Recent Labs  Lab 06/20/20 1720  GLUCAP 82   Lipid Profile: No results for input(s): CHOL, HDL, LDLCALC, TRIG, CHOLHDL, LDLDIRECT in the last 72 hours. Thyroid Function Tests: No results for input(s): TSH, T4TOTAL, FREET4, T3FREE, THYROIDAB in the last 72 hours. Anemia Panel: No results for input(s): VITAMINB12, FOLATE, FERRITIN, TIBC, IRON, RETICCTPCT in the last 72 hours. Sepsis Labs: Recent Labs  Lab 06/20/20 1600  PROCALCITON <0.10  LATICACIDVEN 1.0    Recent Results (from the past 240 hour(s))  Blood Culture (routine x 2)     Status: None   Collection Time: 06/20/20  3:57 PM   Specimen: BLOOD  Result Value Ref Range Status   Specimen Description   Final    BLOOD SITE NOT SPECIFIED Performed at [facility], [address]  [place], [place] [address]    Special Requests   Final    BOTTLES DRAWN AEROBIC AND ANAEROBIC Blood Culture adequate volume Performed at [facility] [address], [place], [place] [address]    Culture   Final    NO GROWTH 5 DAYS Performed at [facility], [place] [address] [place], [place] [address]    Report Status 06/26/2020 FINAL  Final  Resp Panel by RT-PCR (Flu A&B, Covid) Nasopharyngeal Swab     Status: Abnormal   Collection Time: 06/20/20  5:23 PM   Specimen: Nasopharyngeal Swab; Nasopharyngeal(NP) swabs in vial transport medium  Result Value Ref Range Status   SARS Coronavirus 2 by RT PCR POSITIVE (A) NEGATIVE Final    Comment: RESULT CALLED TO, READ BACK BY AND VERIFIED WITH: BROOKS B. 03.17.22 @ 2144 BY MECAIL J. (NOTE) SARS-CoV-2 target nucleic acids are DETECTED.  The SARS-CoV-2 RNA is generally detectable in upper respiratory specimens during the acute phase of infection. Positive results are indicative of the presence of the identified virus, but do not  rule out bacterial infection or co-infection with other pathogens not detected by the test. Clinical correlation with patient history and other diagnostic information is necessary to determine patient infection status. The expected result is Negative.  Fact Sheet for Patients: EntrepreneurPulse.com.au  Fact Sheet for Healthcare Providers: IncredibleEmployment.be  This test is not yet approved or cleared by the [place] FDA and  has been authorized for detection and/or diagnosis of SARS-CoV-2 by FDA under an Emergency Use Authorization (EUA).  This EUA will remain in effect (meaning this test c an be used) for the duration of  the COVID-19 declaration under Section 564(b)(1) of the Act, 21 U.S.C. section 360bbb-3(b)(1), unless the authorization is terminated or revoked sooner.     Influenza A by PCR NEGATIVE NEGATIVE Final   Influenza B by PCR NEGATIVE NEGATIVE Final    Comment: (NOTE) The Xpert Xpress SARS-CoV-2/FLU/RSV plus assay is intended as an aid in the diagnosis of influenza from Nasopharyngeal swab specimens and should not be used as a sole basis for treatment. Nasal washings and aspirates are unacceptable for Xpert Xpress SARS-CoV-2/FLU/RSV testing.  Fact Sheet for Patients: EntrepreneurPulse.com.au  Fact Sheet for Healthcare Providers: IncredibleEmployment.be  This test is not yet approved or cleared by the [place] FDA and has been authorized for detection and/or diagnosis of SARS-CoV-2 by FDA under an Emergency Use Authorization (EUA). This EUA will remain in effect (meaning this test can be used) for the duration of the COVID-19 declaration under Section 564(b)(1) of the Act, 21 U.S.C. section 360bbb-3(b)(1), unless the authorization is terminated or revoked.  Performed at [facility], [place] [address], [place], [place] [address]   Blood Culture (routine x 2)      Status: None   Collection Time: 06/20/20  8:25 PM   Specimen: BLOOD RIGHT HAND  Result Value Ref Range Status   Specimen Description   Final    BLOOD RIGHT HAND Performed at [facility] [address] [place], [place] [address]    Special Requests   Final    BOTTLES DRAWN AEROBIC AND ANAEROBIC Blood Culture results may not be optimal due to an inadequate volume of blood received in culture bottles Performed at [facility] [address] [place], [place] [address]    Culture   Final    NO GROWTH 5 DAYS Performed at [facility], [place],  [place] [address]    Report Status 06/26/2020 FINAL  Final   Radiology Studies: No results found.  Scheduled Meds: . amLODipine  5 mg Oral Daily  . vitamin C  500 mg Oral Daily  . baricitinib  4 mg Oral Daily  . enoxaparin (LOVENOX) injection  40 mg Subcutaneous Q24H  . lactobacillus acidophilus & bulgar  1 tablet Oral TID WC  . loratadine  10 mg Oral Daily  . predniSONE  50 mg Oral Daily  . zinc sulfate  220 mg Oral Daily   Continuous Infusions:   LOS: 6 days   Time spent: 40 min  Melanie Ishikawa, DO  [facility] Available via Epic secure chat 7am-7pm After these hours, please refer to coverage provider listed on [email or web address] 06/27/2020, 7:44 AM

## 2020-06-27 NOTE — Care Management Important Message (Signed)
Important Message  Patient Details IM Letter placed in Patient's door caddy. Name: Melanie Walker MRN: [id number] Date of Birth: 05-Oct-1930   Medicare Important Message Given:  Yes     Kerin Salen 06/27/2020, 11:37 AM

## 2020-06-28 DIAGNOSIS — U071 COVID-19: Secondary | ICD-10-CM | POA: Diagnosis not present

## 2020-06-28 DIAGNOSIS — J9600 Acute respiratory failure, unspecified whether with hypoxia or hypercapnia: Secondary | ICD-10-CM | POA: Diagnosis not present

## 2020-06-28 MED ORDER — AMLODIPINE BESYLATE 10 MG PO TABS
10.0000 mg | ORAL_TABLET | Freq: Every day | ORAL | Status: DC
  Administered 2020-06-28 – 2020-07-02 (×5): 10 mg via ORAL
  Filled 2020-06-28 (×5): qty 1

## 2020-06-28 NOTE — Plan of Care (Signed)
  Problem: Education: Goal: Knowledge of General Education information will improve Description: Including pain rating scale, medication(s)/side effects and non-pharmacologic comfort measures Outcome: Progressing   Problem: Clinical Measurements: Goal: Ability to maintain clinical measurements within normal limits will improve Outcome: Progressing Goal: Will remain free from infection Outcome: Progressing Goal: Diagnostic test results will improve Outcome: Progressing Goal: Respiratory complications will improve Outcome: Progressing Goal: Cardiovascular complication will be avoided Outcome: Progressing   Problem: Coping: Goal: Level of anxiety will decrease Outcome: Progressing   Problem: Elimination: Goal: Will not experience complications related to bowel motility Outcome: Progressing Goal: Will not experience complications related to urinary retention Outcome: Progressing   Problem: Coping: Goal: Psychosocial and spiritual needs will be supported Outcome: Progressing

## 2020-06-28 NOTE — Progress Notes (Signed)
PROGRESS NOTE    Melanie Walker  ZDG:[id number] DOB: 1930/04/15 DOA: 06/20/2020 PCP: Lajean Manes, MD    Brief Narrative:  Melanie Walker is an 87 year old female with past medical history significant for COPD, essential hypertension who presented to the ED with progressive generalized weakness, shortness of breath with multiple falls and confusion.  Patient is a poor historian from suspect encephalopathy and history obtained from both the patient and daughter who is present at bedside as well as discussions with emergency department staff. Dyspnea worse with exertion and improved with rest. Also patient with lower abdominal pain with difficulty urinating. Recently diagnosed with a urinary tract infection and completed course of ciprofloxacin. Although believe her symptoms are related to urinary retention. In the ED patient required supplemental oxygen up to 4 L nasal cannula with notable bilateral peripheral infiltrate in the right lower lobe most notably. Procalcitonin less than 0.10. SARS-CoV-2/Covid-19 PCR positive. Hospitalist service consulted for further evaluation of acute hypoxic respite failure secondary to Covid-19 viral pneumonia.  Assessment & Plan:   Principal Problem:   Acute respiratory failure due to COVID-19 [facility]) Active Problems:   Complex partial seizure ([place])   Essential hypertension   Acute urinary retention   Acute metabolic encephalopathy   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HCC)   COPD with emphysema (HCC)   Acute hypoxic respiratory failure secondary to acute Covid-19 viral pneumonia during the ongoing Covid 19 Pandemic - POA - Unvaccinated with notable covid + swab and hypoxia.  - Remdesivir completed - Baricitinib 4mg  PO daily through 07/04/20 - consider discontinuing prior to discharge given profound improvement in symptoms - IV Solumedrol now transitioned to prednisone 20mg  PO daily - will taper at discharge - Continue precautions through the weekend Walker  results for input(s): DDIMER, FERRITIN, LDH, CRP in the last 72 hours.  Ambulatory dysfunction, acute  -Likely second to above, continue PT OT awaiting SNF placement  Essential hypertension -Amlodipine 10 mg p.o. daily  COPD Lung nodules - Not oxygen dependent at baseline. - Recently diagnosed with spiculated lung nodule right lower lobe measuring 2.6 x 2.6 cm, spiculated nodule medial right lower lobe measuring 1.6 x 1.5 cm without evidence of hilar/mediastinal lymphadenopathy.  Is currently being followed by pulmonary outpatient, Dr. Valeta Harms.  Pending PET CT and CT chest without contrast. - Outpatient follow-up with pulmonology  DVT prophylaxis: Lovenox   Code Status: Full Code Family Communication: Terri at bedside  Disposition Plan:  Level of care: Med-Surg Status is: Inpatient  Remains inpatient appropriate because:Ongoing diagnostic testing needed not appropriate for outpatient work up, Unsafe d/c plan, IV treatments appropriate due to intensity of illness or inability to take PO and Inpatient level of care appropriate due to severity of illness   Dispo: The patient is from: Home              Anticipated d/c is to: Home              Patient currently is not medically stable to d/c.   Difficult to place patient Walker  Consultants:   none  Procedures:   none  Antimicrobials:   none   Subjective: Walker acute issues or events overnight, denies headache, fevers, chills - still very nervous about ambulation.  Objective: Vitals:   06/27/20 1348 06/27/20 2031 06/27/20 2123 06/28/20 0455  BP: 139/62 124/70 130/70 (!) 143/66  Pulse: 93 78 64 69  Resp: 18 18 20 18   Temp: 97.9 F (36.6 C) 97.6 F (36.4 C) (!) 97.5  F (36.4 C) 98.1 F (36.7 C)  TempSrc: Oral Oral Oral Oral  SpO2: 94% 95% 98% 91%  Weight:      Height:        Intake/Output Summary (Last 24 hours) at 06/28/2020 0755 Last data filed at 06/28/2020 0650 Gross per 24 hour  Intake --  Output 1500 ml  Net  -1500 ml   Filed Weights   06/21/20 1514  Weight: 46.4 kg    Examination:  General exam: Appears calm and comfortable, ill in appearance Respiratory system: Clear to auscultation. Respiratory effort normal.  On 2 L nasal cannula SPO2 95% Cardiovascular system: S1 & S2 heard, RRR. Walker JVD, murmurs, rubs, gallops or clicks. Walker pedal edema. Gastrointestinal system: Abdomen is nondistended, soft and nontender. Walker organomegaly or masses felt. Normal bowel sounds heard. Central nervous system: Alert and oriented. Walker focal neurological deficits. Extremities: Symmetric 5 x 5 power. Skin: Walker rashes, lesions or ulcers  Data Reviewed: I have personally reviewed following labs and imaging studies  CBC: Recent Labs  Lab 06/22/20 0422 06/23/20 0416 06/24/20 0436 06/25/20 0440  WBC 4.9 6.2 6.0 7.9  NEUTROABS 4.1 5.0 4.8 5.7  HGB 13.9 14.1 13.9 13.4  HCT 41.7 43.2 42.0 41.0  MCV 89.3 89.3 89.6 88.9  PLT 347 411* 391 176   Basic Metabolic Panel: Recent Labs  Lab 06/22/20 0422 06/23/20 0416 06/24/20 0436 06/25/20 0440  NA 138 138 135 136  K 3.9 3.8 3.9 3.6  CL 107 104 102 103  CO2 24 25 26 26   GLUCOSE 136* 141* 141* 117*  BUN 16 18 23  25*  CREATININE 0.46 0.47 0.66 0.52  CALCIUM 7.7* 8.0* 7.8* 7.7*  MG 2.0 2.2 2.2 2.2   GFR: Estimated Creatinine Clearance: 33.8 mL/min (by C-G formula based on SCr of 0.52 mg/dL). Liver Function Tests: Recent Labs  Lab 06/22/20 0422 06/23/20 0416 06/24/20 0436 06/25/20 0440  AST 37 36 36 44*  ALT 26 26 29  36  ALKPHOS 69 67 68 66  BILITOT 0.6 0.7 0.7 0.9  PROT 5.2* 5.1* 4.9* 4.6*  ALBUMIN 2.5* 2.5* 2.4* 2.4*   Walker results for input(s): LIPASE, AMYLASE in the last 168 hours. Walker results for input(s): AMMONIA in the last 168 hours. Coagulation Profile: Walker results for input(s): INR, PROTIME in the last 168 hours. Cardiac Enzymes: Walker results for input(s): CKTOTAL, CKMB, CKMBINDEX, TROPONINI in the last 168 hours. BNP (last 3 results) Walker  results for input(s): PROBNP in the last 8760 hours. HbA1C: Walker results for input(s): HGBA1C in the last 72 hours. CBG: Walker results for input(s): GLUCAP in the last 168 hours. Lipid Profile: Walker results for input(s): CHOL, HDL, LDLCALC, TRIG, CHOLHDL, LDLDIRECT in the last 72 hours. Thyroid Function Tests: Walker results for input(s): TSH, T4TOTAL, FREET4, T3FREE, THYROIDAB in the last 72 hours. Anemia Panel: Walker results for input(s): VITAMINB12, FOLATE, FERRITIN, TIBC, IRON, RETICCTPCT in the last 72 hours. Sepsis Labs: Walker results for input(s): PROCALCITON, LATICACIDVEN in the last 168 hours.  Recent Results (from the past 240 hour(s))  Blood Culture (routine x 2)     Status: None   Collection Time: 06/20/20  3:57 PM   Specimen: BLOOD  Result Value Ref Range Status   Specimen Description   Final    BLOOD SITE NOT SPECIFIED Performed at [facility], [address], [place], [place] [address]    Special Requests   Final    BOTTLES DRAWN AEROBIC AND ANAEROBIC Blood Culture adequate volume  Performed at [facility], [place] [address] [place], [place] [address]    Culture   Final    Walker GROWTH 5 DAYS Performed at [facility], [place] [address] [place], [place] [address]    Report Status 06/26/2020 FINAL  Final  Resp Panel by RT-PCR (Flu A&B, Covid) Nasopharyngeal Swab     Status: Abnormal   Collection Time: 06/20/20  5:23 PM   Specimen: Nasopharyngeal Swab; Nasopharyngeal(NP) swabs in vial transport medium  Result Value Ref Range Status   SARS Coronavirus 2 by RT PCR POSITIVE (A) NEGATIVE Final    Comment: RESULT CALLED TO, READ BACK BY AND VERIFIED WITH: BROOKS B. 03.17.22 @ 2144 BY MECAIL J. (NOTE) SARS-CoV-2 target nucleic acids are DETECTED.  The SARS-CoV-2 RNA is generally detectable in upper respiratory specimens during the acute phase of infection. Positive results are indicative of the presence of the identified virus, but do not rule out bacterial  infection or co-infection with other pathogens not detected by the test. Clinical correlation with patient history and other diagnostic information is necessary to determine patient infection status. The expected result is Negative.  Fact Sheet for Patients: EntrepreneurPulse.com.au  Fact Sheet for Healthcare Providers: IncredibleEmployment.be  This test is not yet approved or cleared by the [place] FDA and  has been authorized for detection and/or diagnosis of SARS-CoV-2 by FDA under an Emergency Use Authorization (EUA).  This EUA will remain in effect (meaning this test c an be used) for the duration of  the COVID-19 declaration under Section 564(b)(1) of the Act, 21 U.S.C. section 360bbb-3(b)(1), unless the authorization is terminated or revoked sooner.     Influenza A by PCR NEGATIVE NEGATIVE Final   Influenza B by PCR NEGATIVE NEGATIVE Final    Comment: (NOTE) The Xpert Xpress SARS-CoV-2/FLU/RSV plus assay is intended as an aid in the diagnosis of influenza from Nasopharyngeal swab specimens and should not be used as a sole basis for treatment. Nasal washings and aspirates are unacceptable for Xpert Xpress SARS-CoV-2/FLU/RSV testing.  Fact Sheet for Patients: EntrepreneurPulse.com.au  Fact Sheet for Healthcare Providers: IncredibleEmployment.be  This test is not yet approved or cleared by the [place] FDA and has been authorized for detection and/or diagnosis of SARS-CoV-2 by FDA under an Emergency Use Authorization (EUA). This EUA will remain in effect (meaning this test can be used) for the duration of the COVID-19 declaration under Section 564(b)(1) of the Act, 21 U.S.C. section 360bbb-3(b)(1), unless the authorization is terminated or revoked.  Performed at [facility], [place] [address], [place], [place] [address]   Blood Culture (routine x 2)     Status: None    Collection Time: 06/20/20  8:25 PM   Specimen: BLOOD RIGHT HAND  Result Value Ref Range Status   Specimen Description   Final    BLOOD RIGHT HAND Performed at [facility] [address], [place], [place] [address]    Special Requests   Final    BOTTLES DRAWN AEROBIC AND ANAEROBIC Blood Culture results may not be optimal due to an inadequate volume of blood received in culture bottles Performed at [facility] [address], [place], [place] [address]    Culture   Final    Walker GROWTH 5 DAYS Performed at [facility], [place] [address], [place], [place] [address]    Report Status 06/26/2020 FINAL  Final   Radiology Studies: Walker results found.  Scheduled Meds: . amLODipine  5 mg Oral Daily  . vitamin C  500 mg Oral Daily  . baricitinib  4 mg Oral Daily  . enoxaparin (LOVENOX) injection  40 mg Subcutaneous Q24H  . lactobacillus acidophilus & bulgar  1 tablet Oral TID WC  . loratadine  10 mg Oral Daily  . predniSONE  20 mg Oral Daily  . zinc sulfate  220 mg Oral Daily   Continuous Infusions:   LOS: 7 days   Time spent: 40 min  Little Ishikawa, DO  [facility] Available via Epic secure chat 7am-7pm After these hours, please refer to coverage provider listed on [email or web address] 06/28/2020, 7:55 AM

## 2020-06-29 DIAGNOSIS — J9600 Acute respiratory failure, unspecified whether with hypoxia or hypercapnia: Secondary | ICD-10-CM | POA: Diagnosis not present

## 2020-06-29 DIAGNOSIS — U071 COVID-19: Secondary | ICD-10-CM | POA: Diagnosis not present

## 2020-06-29 NOTE — Plan of Care (Signed)

## 2020-06-29 NOTE — Progress Notes (Signed)
PROGRESS NOTE    Melanie Walker  RKY:[id number] DOB: Jun 27, 1930 DOA: 06/20/2020 PCP: Lajean Manes, MD    Brief Narrative:  Melanie Walker is an 87 year old female with past medical history significant for COPD, essential hypertension who presented to the ED with progressive generalized weakness, shortness of breath with multiple falls and confusion.  Patient is a poor historian from suspect encephalopathy and history obtained from both the patient and daughter who is present at bedside as well as discussions with emergency department staff. Dyspnea worse with exertion and improved with rest. Also patient with lower abdominal pain with difficulty urinating. Recently diagnosed with a urinary tract infection and completed course of ciprofloxacin. Although believe her symptoms are related to urinary retention. In the ED patient required supplemental oxygen up to 4 L nasal cannula with notable bilateral peripheral infiltrate in the right lower lobe most notably. Procalcitonin less than 0.10. SARS-CoV-2/Covid-19 PCR positive. Hospitalist service consulted for further evaluation of acute hypoxic respite failure secondary to Covid-19 viral pneumonia.  Assessment & Plan:   Principal Problem:   Acute respiratory failure due to COVID-19 [facility]) Active Problems:   Complex partial seizure ([place])   Essential hypertension   Acute urinary retention   Acute metabolic encephalopathy   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HCC)   COPD with emphysema (HCC)   Acute hypoxic respiratory failure secondary to acute Covid-19 viral pneumonia during the ongoing Covid 19 Pandemic - POA - Unvaccinated with notable covid + swab and hypoxia.  - Remdesivir completed - Baricitinib 4mg  PO daily through 07/04/20 - consider discontinuing prior to discharge given profound improvement in symptoms - IV Solumedrol now transitioned to prednisone 20mg  PO daily - will taper at discharge - Continue precautions through the weekend No  results for input(s): DDIMER, FERRITIN, LDH, CRP in the last 72 hours.  Ambulatory dysfunction, acute  -Likely second to above, continue PT OT awaiting SNF placement  Essential hypertension -Amlodipine 10 mg p.o. daily  COPD Lung nodules - Not oxygen dependent at baseline. - Recently diagnosed with spiculated lung nodule right lower lobe measuring 2.6 x 2.6 cm, spiculated nodule medial right lower lobe measuring 1.6 x 1.5 cm without evidence of hilar/mediastinal lymphadenopathy.  Is currently being followed by pulmonary outpatient, Dr. Valeta Harms.  Pending PET-CT and CT chest without contrast in the outpatient setting. - Outpatient follow-up with pulmonology  DVT prophylaxis: Lovenox   Code Status: Full Code Family Communication: Terri at bedside  Disposition Plan:  Level of care: Med-Surg Status is: Inpatient  Remains inpatient appropriate because:Ongoing diagnostic testing needed not appropriate for outpatient work up, Unsafe d/c plan, IV treatments appropriate due to intensity of illness or inability to take PO and Inpatient level of care appropriate due to severity of illness   Dispo: The patient is from: Home              Anticipated d/c is to: Home              Patient currently is not medically stable to d/c.   Difficult to place patient No  Consultants:   none  Procedures:   none  Antimicrobials:   none   Subjective: No acute issues or events overnight, denies headache, fevers, chills - still very nervous about ambulation.  Objective: Vitals:   06/28/20 1017 06/28/20 1403 06/28/20 2205 06/29/20 0543  BP: (!) 155/68 (!) 142/59 120/67 136/64  Pulse:  89 81 76  Resp:  16 20 20   Temp:  98.1 F (36.7 C) 97.7  F (36.5 C) 98.2 F (36.8 C)  TempSrc:  Oral Oral Oral  SpO2:   97% 94%  Weight:      Height:        Intake/Output Summary (Last 24 hours) at 06/29/2020 0745 Last data filed at 06/29/2020 0500 Gross per 24 hour  Intake 960 ml  Output 650 ml  Net 310  ml   Filed Weights   06/21/20 1514  Weight: 46.4 kg    Examination:  General exam: Appears calm and comfortable Respiratory system: Clear to auscultation. Respiratory effort normal.  On 2 L nasal cannula Cardiovascular system: S1 & S2 heard, RRR. No JVD, murmurs, rubs, gallops or clicks. No pedal edema. Gastrointestinal system: Abdomen is nondistended, soft and nontender. No organomegaly or masses felt. Normal bowel sounds heard. Central nervous system: Alert and oriented. No focal neurological deficits. Extremities: Symmetric 5 x 5 power. Skin: No rashes, lesions or ulcers  Data Reviewed: I have personally reviewed following labs and imaging studies  CBC: Recent Labs  Lab 06/23/20 0416 06/24/20 0436 06/25/20 0440  WBC 6.2 6.0 7.9  NEUTROABS 5.0 4.8 5.7  HGB 14.1 13.9 13.4  HCT 43.2 42.0 41.0  MCV 89.3 89.6 88.9  PLT 411* 391 941   Basic Metabolic Panel: Recent Labs  Lab 06/23/20 0416 06/24/20 0436 06/25/20 0440  NA 138 135 136  K 3.8 3.9 3.6  CL 104 102 103  CO2 25 26 26   GLUCOSE 141* 141* 117*  BUN 18 23 25*  CREATININE 0.47 0.66 0.52  CALCIUM 8.0* 7.8* 7.7*  MG 2.2 2.2 2.2   GFR: Estimated Creatinine Clearance: 33.8 mL/min (by C-G formula based on SCr of 0.52 mg/dL). Liver Function Tests: Recent Labs  Lab 06/23/20 0416 06/24/20 0436 06/25/20 0440  AST 36 36 44*  ALT 26 29 36  ALKPHOS 67 68 66  BILITOT 0.7 0.7 0.9  PROT 5.1* 4.9* 4.6*  ALBUMIN 2.5* 2.4* 2.4*   No results for input(s): LIPASE, AMYLASE in the last 168 hours. No results for input(s): AMMONIA in the last 168 hours. Coagulation Profile: No results for input(s): INR, PROTIME in the last 168 hours. Cardiac Enzymes: No results for input(s): CKTOTAL, CKMB, CKMBINDEX, TROPONINI in the last 168 hours. BNP (last 3 results) No results for input(s): PROBNP in the last 8760 hours. HbA1C: No results for input(s): HGBA1C in the last 72 hours. CBG: No results for input(s): GLUCAP in the last  168 hours. Lipid Profile: No results for input(s): CHOL, HDL, LDLCALC, TRIG, CHOLHDL, LDLDIRECT in the last 72 hours. Thyroid Function Tests: No results for input(s): TSH, T4TOTAL, FREET4, T3FREE, THYROIDAB in the last 72 hours. Anemia Panel: No results for input(s): VITAMINB12, FOLATE, FERRITIN, TIBC, IRON, RETICCTPCT in the last 72 hours. Sepsis Labs: No results for input(s): PROCALCITON, LATICACIDVEN in the last 168 hours.  Recent Results (from the past 240 hour(s))  Blood Culture (routine x 2)     Status: None   Collection Time: 06/20/20  3:57 PM   Specimen: BLOOD  Result Value Ref Range Status   Specimen Description   Final    BLOOD SITE NOT SPECIFIED Performed at [facility], [address] [place], [place] [address]    Special Requests   Final    BOTTLES DRAWN AEROBIC AND ANAEROBIC Blood Culture adequate volume Performed at [place] [address] [place], [place] [address]    Culture   Final    NO GROWTH 5 DAYS Performed at [facility], [place] [address], [place], [place] [address]    Report Status 06/26/2020 FINAL  Final  Resp Panel by RT-PCR (Flu A&B, Covid) Nasopharyngeal Swab     Status: Abnormal   Collection Time: 06/20/20  5:23 PM   Specimen: Nasopharyngeal Swab; Nasopharyngeal(NP) swabs in vial transport medium  Result Value Ref Range Status   SARS Coronavirus 2 by RT PCR POSITIVE (A) NEGATIVE Final    Comment: RESULT CALLED TO, READ BACK BY AND VERIFIED WITH: BROOKS B. 03.17.22 @ 2144 BY MECAIL J. (NOTE) SARS-CoV-2 target nucleic acids are DETECTED.  The SARS-CoV-2 RNA is generally detectable in upper respiratory specimens during the acute phase of infection. Positive results are indicative of the presence of the identified virus, but do not rule out bacterial infection or co-infection with other pathogens not detected by the test. Clinical correlation with patient history and other diagnostic information is necessary to  determine patient infection status. The expected result is Negative.  Fact Sheet for Patients: EntrepreneurPulse.com.au  Fact Sheet for Healthcare Providers: IncredibleEmployment.be  This test is not yet approved or cleared by the [place] FDA and  has been authorized for detection and/or diagnosis of SARS-CoV-2 by FDA under an Emergency Use Authorization (EUA).  This EUA will remain in effect (meaning this test c an be used) for the duration of  the COVID-19 declaration under Section 564(b)(1) of the Act, 21 U.S.C. section 360bbb-3(b)(1), unless the authorization is terminated or revoked sooner.     Influenza A by PCR NEGATIVE NEGATIVE Final   Influenza B by PCR NEGATIVE NEGATIVE Final    Comment: (NOTE) The Xpert Xpress SARS-CoV-2/FLU/RSV plus assay is intended as an aid in the diagnosis of influenza from Nasopharyngeal swab specimens and should not be used as a sole basis for treatment. Nasal washings and aspirates are unacceptable for Xpert Xpress SARS-CoV-2/FLU/RSV testing.  Fact Sheet for Patients: EntrepreneurPulse.com.au  Fact Sheet for Healthcare Providers: IncredibleEmployment.be  This test is not yet approved or cleared by the [place] FDA and has been authorized for detection and/or diagnosis of SARS-CoV-2 by FDA under an Emergency Use Authorization (EUA). This EUA will remain in effect (meaning this test can be used) for the duration of the COVID-19 declaration under Section 564(b)(1) of the Act, 21 U.S.C. section 360bbb-3(b)(1), unless the authorization is terminated or revoked.  Performed at [facility], [place] [address] [place], [place] [address]   Blood Culture (routine x 2)     Status: None   Collection Time: 06/20/20  8:25 PM   Specimen: BLOOD RIGHT HAND  Result Value Ref Range Status   Specimen Description   Final    BLOOD RIGHT HAND Performed at  [facility] [address], [place], [place] [address]    Special Requests   Final    BOTTLES DRAWN AEROBIC AND ANAEROBIC Blood Culture results may not be optimal due to an inadequate volume of blood received in culture bottles Performed at [facility] [address], [place], [place] [address]    Culture   Final    NO GROWTH 5 DAYS Performed at [facility], [place] [address], [place], [place] [address]    Report Status 06/26/2020 FINAL  Final   Radiology Studies: No results found.  Scheduled Meds:  amLODipine  10 mg Oral Daily   vitamin C  500 mg Oral Daily   baricitinib  4 mg Oral Daily   enoxaparin (LOVENOX) injection  40 mg Subcutaneous  Q24H   lactobacillus acidophilus & bulgar  1 tablet Oral TID WC   loratadine  10 mg Oral Daily   predniSONE  20 mg Oral Daily   zinc sulfate  220 mg Oral Daily   Continuous Infusions:   LOS: 8 days   Time spent: 40 min  Little Ishikawa, DO  [facility] Available via Epic secure chat 7am-7pm After these hours, please refer to coverage provider listed on [email or web address] 06/29/2020, 7:45 AM

## 2020-06-29 NOTE — Progress Notes (Addendum)
Shift Summary:   After going to the bathroom this morning, patient endorsed, " my bladder is hurting, why does it hurt." Patient output was 250, when bladder scanned, patient had a remaining 275 in bladder, informed patient that she may be retaining urine and will need to be in and out cathed if she continues to retain, patient endorses let me try and pee again I don't want to be in and out cathed, made MD [facility] aware, and per MD in and cath is fine. Patient used the bathroom shortly after and emptied bladder. Otherwise uneventful shift, remained alert and oriented x 4. RR remained even and unlabored. Remains on 2L of nasal cannula VSS during this shift, no pain needs identified. Will continue to monitor

## 2020-06-30 DIAGNOSIS — J9600 Acute respiratory failure, unspecified whether with hypoxia or hypercapnia: Secondary | ICD-10-CM | POA: Diagnosis not present

## 2020-06-30 DIAGNOSIS — U071 COVID-19: Secondary | ICD-10-CM | POA: Diagnosis not present

## 2020-06-30 NOTE — Progress Notes (Signed)
Shift summary:   Uneventful shift. Worked with therapy. Weaned down to 1L of nasal during this shift. Remained alert and oriented x 4.  RR remained even and unlabored. Worked with therapy.Remains 1 assist to [place]. Worked with therapy. Daughter visited and given updates. No other needs identified.

## 2020-06-30 NOTE — Progress Notes (Signed)
PROGRESS NOTE    Melanie Walker  FGH:[id number] DOB: 08-21-1930 DOA: 06/20/2020 PCP: Lajean Manes, MD    Brief Narrative:  Melanie Walker is an 87 year old female with past medical history significant for COPD, essential hypertension who presented to the ED with progressive generalized weakness, shortness of breath with multiple falls and confusion.  Patient is a poor historian from suspect encephalopathy and history obtained from both the patient and daughter who is present at bedside as well as discussions with emergency department staff. Dyspnea worse with exertion and improved with rest. Also patient with lower abdominal pain with difficulty urinating. Recently diagnosed with a urinary tract infection and completed course of ciprofloxacin. Although believe her symptoms are related to urinary retention. In the ED patient required supplemental oxygen up to 4 L nasal cannula with notable bilateral peripheral infiltrate in the right lower lobe most notably. Procalcitonin less than 0.10. SARS-CoV-2/Covid-19 PCR positive. Hospitalist service consulted for further evaluation of acute hypoxic respite failure secondary to Covid-19 viral pneumonia.  Assessment & Plan:   Principal Problem:   Acute respiratory failure due to COVID-19 [facility]) Active Problems:   Complex partial seizure ([place])   Essential hypertension   Acute urinary retention   Acute metabolic encephalopathy   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HCC)   COPD with emphysema (HCC)   Acute hypoxic respiratory failure secondary to acute Covid-19 viral pneumonia during the ongoing Covid 19 Pandemic - POA - Unvaccinated with notable covid + swab and hypoxia.  - Remdesivir completed - Baricitinib 4mg  PO daily through 07/04/20 - consider discontinuing prior to discharge given profound improvement in symptoms - IV Solumedrol now transitioned to prednisone 20mg  PO daily - will taper at discharge - Continue precautions through the weekend SpO2: 98  % O2 Flow Rate (L/min): 2 L/min No results for input(s): DDIMER, FERRITIN, LDH, CRP in the last 72 hours.  Ambulatory dysfunction, acute  -Likely second to above, continue PT OT awaiting SNF placement  Urinary outlet obstruction -Foley reinserted 3/26 - follow with outpatient urology  Essential hypertension -Amlodipine 10 mg p.o. daily  COPD Lung nodules - Not oxygen dependent at baseline. - Recently diagnosed with spiculated lung nodule right lower lobe measuring 2.6 x 2.6 cm, spiculated nodule medial right lower lobe measuring 1.6 x 1.5 cm without evidence of hilar/mediastinal lymphadenopathy.  Is currently being followed by pulmonary outpatient, Dr. Valeta Harms.  Pending PET-CT and CT chest without contrast in the outpatient setting. - Outpatient follow-up with pulmonology  DVT prophylaxis: Lovenox   Code Status: Full Code Family Communication: Terri at bedside  Disposition Plan:  Level of care: Med-Surg Status is: Inpatient  Remains inpatient appropriate because:Ongoing diagnostic testing needed not appropriate for outpatient work up, Unsafe d/c plan, IV treatments appropriate due to intensity of illness or inability to take PO and Inpatient level of care appropriate due to severity of illness   Dispo: The patient is from: Home              Anticipated d/c is to: Home              Patient currently is not medically stable to d/c.   Difficult to place patient No  Consultants:   none  Procedures:   none  Antimicrobials:   none   Subjective: No acute issues or events overnight, denies headache, fevers, chills - still very nervous about ambulation.  Objective: Vitals:   06/29/20 1341 06/29/20 1941 06/29/20 2034 06/30/20 0544  BP: 119/63 (!) 120/54 127/61 135/65  Pulse: 93 80 81 71  Resp: (!) 23 (!) 21 18 18   Temp: 98 F (36.7 C) 98 F (36.7 C) 98 F (36.7 C) 97.8 F (36.6 C)  TempSrc: Oral Oral Oral Oral  SpO2: 94% 94% 94% 98%  Weight:      Height:         Intake/Output Summary (Last 24 hours) at 06/30/2020 0800 Last data filed at 06/30/2020 0500 Gross per 24 hour  Intake 360 ml  Output 1400 ml  Net -1040 ml   Filed Weights   06/21/20 1514  Weight: 46.4 kg    Examination:  General exam: Appears calm and comfortable Respiratory system: Clear to auscultation. Respiratory effort normal.  On 2 L nasal cannula Cardiovascular system: S1 & S2 heard, RRR. No JVD, murmurs, rubs, gallops or clicks. No pedal edema. Gastrointestinal system: Abdomen is nondistended, soft and nontender. No organomegaly or masses felt. Normal bowel sounds heard. Central nervous system: Alert and oriented. No focal neurological deficits. Extremities: Symmetric 5 x 5 power. Skin: No rashes, lesions or ulcers  Data Reviewed: I have personally reviewed following labs and imaging studies  CBC: Recent Labs  Lab 06/24/20 0436 06/25/20 0440  WBC 6.0 7.9  NEUTROABS 4.8 5.7  HGB 13.9 13.4  HCT 42.0 41.0  MCV 89.6 88.9  PLT 391 366   Basic Metabolic Panel: Recent Labs  Lab 06/24/20 0436 06/25/20 0440  NA 135 136  K 3.9 3.6  CL 102 103  CO2 26 26  GLUCOSE 141* 117*  BUN 23 25*  CREATININE 0.66 0.52  CALCIUM 7.8* 7.7*  MG 2.2 2.2   GFR: Estimated Creatinine Clearance: 33.8 mL/min (by C-G formula based on SCr of 0.52 mg/dL). Liver Function Tests: Recent Labs  Lab 06/24/20 0436 06/25/20 0440  AST 36 44*  ALT 29 36  ALKPHOS 68 66  BILITOT 0.7 0.9  PROT 4.9* 4.6*  ALBUMIN 2.4* 2.4*   No results for input(s): LIPASE, AMYLASE in the last 168 hours. No results for input(s): AMMONIA in the last 168 hours. Coagulation Profile: No results for input(s): INR, PROTIME in the last 168 hours. Cardiac Enzymes: No results for input(s): CKTOTAL, CKMB, CKMBINDEX, TROPONINI in the last 168 hours. BNP (last 3 results) No results for input(s): PROBNP in the last 8760 hours. HbA1C: No results for input(s): HGBA1C in the last 72 hours. CBG: No results for  input(s): GLUCAP in the last 168 hours. Lipid Profile: No results for input(s): CHOL, HDL, LDLCALC, TRIG, CHOLHDL, LDLDIRECT in the last 72 hours. Thyroid Function Tests: No results for input(s): TSH, T4TOTAL, FREET4, T3FREE, THYROIDAB in the last 72 hours. Anemia Panel: No results for input(s): VITAMINB12, FOLATE, FERRITIN, TIBC, IRON, RETICCTPCT in the last 72 hours. Sepsis Labs: No results for input(s): PROCALCITON, LATICACIDVEN in the last 168 hours.  Recent Results (from the past 240 hour(s))  Blood Culture (routine x 2)     Status: None   Collection Time: 06/20/20  3:57 PM   Specimen: BLOOD  Result Value Ref Range Status   Specimen Description   Final    BLOOD SITE NOT SPECIFIED Performed at [facility], [address], [place], [place] [address]    Special Requests   Final    BOTTLES DRAWN AEROBIC AND ANAEROBIC Blood Culture adequate volume Performed at [facility] [address], [place], [place] [address]    Culture   Final    NO GROWTH 5 DAYS Performed at [facility], [address] [place], [place] [address]    Report Status 06/26/2020 FINAL  Final  Resp Panel by RT-PCR (Flu A&B, Covid) Nasopharyngeal Swab     Status: Abnormal   Collection Time: 06/20/20  5:23 PM   Specimen: Nasopharyngeal Swab; Nasopharyngeal(NP) swabs in vial transport medium  Result Value Ref Range Status   SARS Coronavirus 2 by RT PCR POSITIVE (A) NEGATIVE Final    Comment: RESULT CALLED TO, READ BACK BY AND VERIFIED WITH: BROOKS B. 03.17.22 @ 2144 BY MECAIL J. (NOTE) SARS-CoV-2 target nucleic acids are DETECTED.  The SARS-CoV-2 RNA is generally detectable in upper respiratory specimens during the acute phase of infection. Positive results are indicative of the presence of the identified virus, but do not rule out bacterial infection or co-infection with other pathogens not detected by the test. Clinical correlation with patient history and other diagnostic  information is necessary to determine patient infection status. The expected result is Negative.  Fact Sheet for Patients: EntrepreneurPulse.com.au  Fact Sheet for Healthcare Providers: IncredibleEmployment.be  This test is not yet approved or cleared by the [place] FDA and  has been authorized for detection and/or diagnosis of SARS-CoV-2 by FDA under an Emergency Use Authorization (EUA).  This EUA will remain in effect (meaning this test c an be used) for the duration of  the COVID-19 declaration under Section 564(b)(1) of the Act, 21 U.S.C. section 360bbb-3(b)(1), unless the authorization is terminated or revoked sooner.     Influenza A by PCR NEGATIVE NEGATIVE Final   Influenza B by PCR NEGATIVE NEGATIVE Final    Comment: (NOTE) The Xpert Xpress SARS-CoV-2/FLU/RSV plus assay is intended as an aid in the diagnosis of influenza from Nasopharyngeal swab specimens and should not be used as a sole basis for treatment. Nasal washings and aspirates are unacceptable for Xpert Xpress SARS-CoV-2/FLU/RSV testing.  Fact Sheet for Patients: EntrepreneurPulse.com.au  Fact Sheet for Healthcare Providers: IncredibleEmployment.be  This test is not yet approved or cleared by the [place] FDA and has been authorized for detection and/or diagnosis of SARS-CoV-2 by FDA under an Emergency Use Authorization (EUA). This EUA will remain in effect (meaning this test can be used) for the duration of the COVID-19 declaration under Section 564(b)(1) of the Act, 21 U.S.C. section 360bbb-3(b)(1), unless the authorization is terminated or revoked.  Performed at [facility], [place] [address], [place], [place] [address]   Blood Culture (routine x 2)     Status: None   Collection Time: 06/20/20  8:25 PM   Specimen: BLOOD RIGHT HAND  Result Value Ref Range Status   Specimen Description   Final    BLOOD  RIGHT HAND Performed at [facility] [address], [place], [place] [address]    Special Requests   Final    BOTTLES DRAWN AEROBIC AND ANAEROBIC Blood Culture results may not be optimal due to an inadequate volume of blood received in culture bottles Performed at [facility] [address], [place], [place] [address]    Culture   Final    NO GROWTH 5 DAYS Performed at [facility], [place] [address] [place], [place] [address]    Report Status 06/26/2020 FINAL  Final   Radiology Studies: No results found.  Scheduled Meds: . amLODipine  10 mg Oral Daily  . vitamin C  500 mg Oral Daily  . baricitinib  4 mg Oral Daily  . enoxaparin (LOVENOX) injection  40 mg Subcutaneous Q24H  .  lactobacillus acidophilus & bulgar  1 tablet Oral TID WC  . loratadine  10 mg Oral Daily  . predniSONE  20 mg Oral Daily  . zinc sulfate  220 mg Oral Daily   Continuous Infusions:   LOS: 9 days   Time spent: 40 min  Little Ishikawa, DO  [facility] Available via Epic secure chat 7am-7pm After these hours, please refer to coverage provider listed on [email or web address] 06/30/2020, 8:00 AM

## 2020-06-30 NOTE — Progress Notes (Signed)
Occupational Therapy Treatment Patient Details Name: Melanie Walker MRN: [id number] DOB: 02-06-1931 Today's Date: 06/30/2020    History of present illness 87 year old female with past medical history significant for COPD, essential hypertension who presented to the ED with progressive generalized weakness, shortness of breath with multiple falls and confusion.  Patient is a poor historian from suspect encephalopathy and history obtained from both the patient and daughter who is present at bedside as well as discussions with emergency department staff.  Dyspnea worse with exertion and improved with rest.  Also patient with lower abdominal pain with difficulty urinating.  Recently diagnosed with a urinary tract infection and completed course of ciprofloxacin.  Although believe her symptoms are related to urinary retention.  Pt tested positive for COVID.   OT comments  Treatment focused on promoting activity out of bed and increasing activity tolerance. Patient ambulated to [facility] (placed slightly farther away), performed toileting, ambulated to recliner 4 feet away and after rest ambulated 12 feet. Required verbal cues for breathing technique, encouragement due to anxiety/fear of walking. Patient did will. O2 sat dropped to 86% but recovered quickly on 1 L and verbal cues to breathe through nose. Cont POC to progress toward sgoals.  Follow Up Recommendations  SNF    Equipment Recommendations  None recommended by OT    Recommendations for Other Services      Precautions / Restrictions Precautions Precautions: Fall Precaution Comments: monitor O2, on 1 L (ext tubing) Restrictions Weight Bearing Restrictions: No       Mobility Bed Mobility Overal bed mobility: Needs Assistance Bed Mobility: Supine to Sit       Sit to supine: Min guard;HOB elevated   General bed mobility comments: Patient able to transfer into sitting with increased time.    Transfers Overall transfer level: Needs  assistance Equipment used: Rolling walker (2 wheeled) Transfers: Sit to/from [facility] Sit to Stand: Min guard Stand pivot transfers: Min guard       General transfer comment: Min guard for standing with Rw and take steps to [facility] and then recliner. After rest patietn ambulated 6 feet forward and then back to recliner. o2 sat dropped to 86% with activity on 1 L but recovered with  verbal cues to breathe through nose.    Balance Overall balance assessment: Needs assistance Sitting-balance support: No upper extremity supported Sitting balance-Leahy Scale: Fair     Standing balance support: Bilateral upper extremity supported Standing balance-Leahy Scale: Poor                             ADL either performed or assessed with clinical judgement   ADL Overall ADL's : Needs assistance/impaired                         Toilet Transfer: Min Lawyer Details (indicate cue type and reason): Able to take steps using RW to [facility] placed 2 feet away. Toileting- Clothing Manipulation and Hygiene: Sitting/lateral lean;Min guard Toileting - Clothing Manipulation Details (indicate cue type and reason): min guard for toileting in seated position. Removed o2 as sat 90%. Dropped t 88% on RA.             Vision   Vision Assessment?: No apparent visual deficits   Perception     Praxis      Cognition Arousal/Alertness: Awake/alert Behavior During Therapy: WFL for tasks assessed/performed Overall Cognitive Status: Within Functional Limits  for tasks assessed                                          Exercises     Shoulder Instructions       General Comments      Pertinent Vitals/ Pain       Pain Assessment: No/denies pain  Home Living                                          Prior Functioning/Environment              Frequency  Min 2X/week        Progress Toward  Goals  OT Goals(current goals can now be found in the care plan section)  Progress towards OT goals: Progressing toward goals  Acute Rehab OT Goals Patient Stated Goal: return to independence OT Goal Formulation: With patient Time For Goal Achievement: 07/07/20 Potential to Achieve Goals: Good  Plan Discharge plan remains appropriate    Co-evaluation                 AM-PAC OT "6 Clicks" Daily Activity     Outcome Measure   Help from another person eating meals?: A Little Help from another person taking care of personal grooming?: A Little Help from another person toileting, which includes using toliet, bedpan, or urinal?: A Little Help from another person bathing (including washing, rinsing, drying)?: A Little Help from another person to put on and taking off regular upper body clothing?: A Little Help from another person to put on and taking off regular lower body clothing?: A Little 6 Click Score: 18    End of Session Equipment Utilized During Treatment: Gait belt;Rolling walker;Oxygen  OT Visit Diagnosis: Unsteadiness on feet (R26.81);Pain;History of falling (Z91.81);Repeated falls (R29.6);Other symptoms and signs involving the nervous system (R29.898)   Activity Tolerance Patient tolerated treatment well   Patient Left in chair;with call bell/phone within reach;with family/visitor present   Nurse Communication Mobility status        Time: 0938-1829 OT Time Calculation (min): 22 min  Charges: OT General Charges $OT Visit: 1 Visit OT Treatments $Therapeutic Activity: 8-22 mins  Derl Barrow, OTR/L [place]  Office [phone] Pager: [place] 06/30/2020, 2:54 PM

## 2020-06-30 NOTE — Plan of Care (Signed)

## 2020-06-30 NOTE — Plan of Care (Signed)

## 2020-07-01 DIAGNOSIS — J9600 Acute respiratory failure, unspecified whether with hypoxia or hypercapnia: Secondary | ICD-10-CM | POA: Diagnosis not present

## 2020-07-01 DIAGNOSIS — U071 COVID-19: Secondary | ICD-10-CM | POA: Diagnosis not present

## 2020-07-01 MED ORDER — MELATONIN 10 MG PO TABS: 10.0000 mg | ORAL_TABLET | Freq: Every evening | ORAL | 0 refills | Status: DC | PRN

## 2020-07-01 NOTE — TOC Progression Note (Signed)
Transition of Care [facility]) - Progression Note    Patient Details  Name: Melanie Walker MRN: [id number] Date of Birth: 09-24-1930  Transition of Care [facility]) CM/SW Contact  Purcell Mouton, RN Phone Number: 07/01/2020, 9:21 AM  Clinical Narrative:     Pt is not [place] will need to start authorization. A call to facility to confirm bed offer was made. Facility will need to check benefits before accepting pt. Waiting on SNF.   Expected Discharge Plan: [place] Barriers to Discharge: No Barriers Identified  Expected Discharge Plan and Services Expected Discharge Plan: [place] arrangements for the past 2 months: Single Family Home Expected Discharge Date: 07/01/20                                     Social Determinants of Health (SDOH) Interventions    Readmission Risk Interventions No flowsheet data found.

## 2020-07-01 NOTE — Discharge Summary (Signed)
Physician Discharge Summary  Melanie Walker KKX:[id number] DOB: [birth year] DOA: 06/20/2020  PCP: Lajean Manes, MD  Admit date: 06/20/2020 Discharge date: 07/01/2020  Admitted From: Home Disposition: SNF  Recommendations for Outpatient Follow-up:  1. Follow up with PCP in 1-2 weeks 2. Please obtain BMP/CBC in one week 3. Please follow up on the following pending results:  Discharge Condition: Stable CODE STATUS: Full Diet recommendation: Low-salt low-fat diet  Brief/Interim Summary: Melanie Walker is an 87 year old female with past medical history significant for COPD, essential hypertension who presented to the ED with progressive generalized weakness, shortness of breath with multiple falls and confusion.  Patient is a poor historian from suspect encephalopathy and history obtained from both the patient and daughter who is present at bedside as well as discussions with emergency department staff. Dyspnea worse with exertion and improved with rest. Also patient with lower abdominal pain with difficulty urinating. Recently diagnosed with a urinary tract infection and completed course of ciprofloxacin. Although believe her symptoms are related to urinary retention. In the ED patient required supplemental oxygen up to 4 L nasal cannula with notable bilateral peripheral infiltrate in the right lower lobe most notably. Procalcitonin less than 0.10. SARS-CoV-2/Covid-19 PCR positive. Hospitalist service consulted for further evaluation of acute hypoxic respite failure secondary to Covid-19 viral pneumonia.  Patient admitted as above with acute hypoxic respiratory failure in setting of COVID-19 pneumonia.  She remains unvaccinated, lengthy discussion about need for vaccination given her advanced age, comorbidities and ongoing pandemic.  She completed Remdesivir, continue steroid taper and discontinue baricitinib at discharge.  She otherwise appears quite well, ongoing ambulatory dysfunction also improving  but not yet back to baseline, remains unstable hence discharge to skilled nursing facility for ongoing physical therapy and treatment.  We continue to wean oxygen as well, patient today is able to be on room air at rest but still requires oxygen supplementation with ambulation.  Urinary obstruction appears to have been resolved, external Foley catheter appears to be doing well for the patient given her limited ambulatory status.  She is otherwise stable and agreeable for discharge to SNF, family notified at discharge.  Discharge Diagnoses:  Principal Problem:   Acute respiratory failure due to COVID-19 [facility]) Active Problems:   Complex partial seizure ([place])   Essential hypertension   Acute urinary retention   Acute metabolic encephalopathy   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HCC)   COPD with emphysema [facility])    Discharge Instructions  Discharge Instructions    Call MD for:  difficulty breathing, headache or visual disturbances   Complete by: As directed    Call MD for:  temperature >100.4   Complete by: As directed    Diet - low sodium heart healthy   Complete by: As directed    Increase activity slowly   Complete by: As directed      Allergies as of 07/01/2020      Reactions   Biaxin [clarithromycin]    Boniva [ibandronic Acid] Other (See Comments)   Upset stomach   Ceftin [cefuroxime Axetil]    Dilantin [phenytoin Sodium Extended]    Fosamax [alendronate Sodium] Other (See Comments)   Upset stomach   Guaifenesin & Derivatives    Imitrex [sumatriptan]    Keflex [cephalexin]    Macrobid [nitrofurantoin Macrocrystal] Hives, Itching   Montelukast    Other    Flu shot made rash on arm and caused itching to arm   Penicillins    Premarin [conjugated Estrogens] Itching  Sulfa Antibiotics    Hctz [hydrochlorothiazide] Rash   Latex Rash      Medication List    STOP taking these medications   ciprofloxacin 500 MG tablet Commonly known as: CIPRO   diclofenac sodium 1 %  Gel Commonly known as: VOLTAREN   lidocaine-prilocaine cream Commonly known as: EMLA   zonisamide 100 MG capsule Commonly known as: ZONEGRAN     TAKE these medications   amLODipine 5 MG tablet Commonly known as: NORVASC Take 5 mg by mouth daily.   cetirizine 10 MG tablet Commonly known as: ZyrTEC Allergy Take 1 tablet (10 mg total) by mouth daily.   Melatonin 10 MG Tabs Take 10 mg by mouth at bedtime as needed. What changed:   medication strength  how much to take  reasons to take this   SENTRY SENIOR PO Take 1 tablet by mouth daily.       Allergies  Allergen Reactions  . Biaxin [Clarithromycin]   . Boniva [Ibandronic Acid] Other (See Comments)    Upset stomach  . Ceftin [Cefuroxime Axetil]   . Dilantin [Phenytoin Sodium Extended]   . Fosamax [Alendronate Sodium] Other (See Comments)    Upset stomach  . Guaifenesin & Derivatives   . Imitrex [Sumatriptan]   . Keflex [Cephalexin]   . Macrobid [Nitrofurantoin Macrocrystal] Hives and Itching  . Montelukast   . Other     Flu shot made rash on arm and caused itching to arm  . Penicillins   . Premarin [Conjugated Estrogens] Itching  . Sulfa Antibiotics   . Hctz [Hydrochlorothiazide] Rash  . Latex Rash    Consultations:  None   Procedures/Studies: CT HEAD WO CONTRAST  Result Date: 06/20/2020 CLINICAL DATA:  Weakness. EXAM: CT HEAD WITHOUT CONTRAST CT CERVICAL SPINE WITHOUT CONTRAST TECHNIQUE: Multidetector CT imaging of the head and cervical spine was performed following the standard protocol without intravenous contrast. Multiplanar CT image reconstructions of the cervical spine were also generated. COMPARISON:  None. FINDINGS: CT HEAD FINDINGS Brain: Mild diffuse cortical atrophy is noted. Mild chronic ischemic [place] matter disease is noted. No mass effect or midline shift is noted. Ventricular size is within normal limits. There is no evidence of mass lesion, hemorrhage or acute infarction. Vascular: No  hyperdense vessel or unexpected calcification. Skull: Normal. Negative for fracture or focal lesion. Sinuses/Orbits: No acute finding. Other: None. CT CERVICAL SPINE FINDINGS Alignment: Minimal grade 1 anterolisthesis of C4-5 is noted secondary to posterior facet joint hypertrophy. Skull base and vertebrae: No acute fracture. No primary bone lesion or focal pathologic process. Soft tissues and spinal canal: No prevertebral fluid or swelling. No visible canal hematoma. Disc levels: Severe degenerative disc disease is noted at C5-6. Moderate degenerative disc disease is noted at C6-7. Upper chest: Negative. Other: None. IMPRESSION: 1. Mild diffuse cortical atrophy. Mild chronic ischemic [place] matter disease. No acute intracranial abnormality seen. 2. Multilevel degenerative disc disease. No acute abnormality seen in the cervical spine. Electronically Signed   By: Marijo Conception M.D.   On: 06/20/2020 17:24   CT Chest Wo Contrast  Result Date: 06/20/2020 CLINICAL DATA:  Weakness. EXAM: CT CHEST WITHOUT CONTRAST TECHNIQUE: Multidetector CT imaging of the chest was performed following the standard protocol without IV contrast. COMPARISON:  June 12, 2020 FINDINGS: Cardiovascular: There is moderate severity calcification of the aortic arch and descending thoracic aorta, without evidence of aneurysmal dilatation. Normal heart size. No pericardial effusion. Mediastinum/Nodes: No enlarged mediastinal or axillary lymph nodes. Thyroid gland, trachea, and esophagus  demonstrate no significant findings. Lungs/Pleura: Centrilobular emphysematous lung disease is seen, bilaterally. A stable 4 mm calcified lung nodule is seen within the posterior aspect of the left upper lobe. Marked severity peripheral, patchy airspace disease is again seen within the bilateral upper lobes and bilateral lower lobes. This is increased in severity when compared to the prior study. A 3.3 cm x 2.4 cm ill-defined mass-like area of airspace disease is  seen within the right lung base. There is a very small right pleural effusion. No pneumothorax is identified. Upper Abdomen: Subcentimeter gallstones are seen within a contracted gallbladder. Musculoskeletal: Degenerative changes are seen throughout the thoracic spine. IMPRESSION: 1. Marked severity bilateral peripheral infiltrates, increased in severity when compared to the prior study. 2. Area of right lower lobe mass-like consolidation which may represent an area of pneumonia. Follow-up to resolution is recommended, as an underlying neoplastic process cannot be excluded. 3. Very small right pleural effusion. 4. Cholelithiasis. 5. Emphysema and aortic atherosclerosis. Aortic Atherosclerosis (ICD10-I70.0) and Emphysema (ICD10-J43.9). Electronically Signed   By: Virgina Norfolk M.D.   On: 06/20/2020 17:25   CT Angio Chest PE W and/or Wo Contrast  Result Date: 06/12/2020 CLINICAL DATA:  Weakness and shortness of breath EXAM: CT ANGIOGRAPHY CHEST WITH CONTRAST TECHNIQUE: Multidetector CT imaging of the chest was performed using the standard protocol during bolus administration of intravenous contrast. Multiplanar CT image reconstructions and MIPs were obtained to evaluate the vascular anatomy. CONTRAST:  49mL OMNIPAQUE IOHEXOL 350 MG/ML SOLN COMPARISON:  None. FINDINGS: Cardiovascular: There is a optimal opacification of the pulmonary arteries. There is no central,segmental, or subsegmental filling defects within the pulmonary arteries. The heart is normal in size. No pericardial effusion or thickening. No evidence right heart strain. There is normal three-vessel brachiocephalic anatomy without proximal stenosis. Aortic atherosclerosis is noted. Coronary artery calcifications are seen. Mediastinum/Nodes: No hilar, mediastinal, or axillary adenopathy. Thyroid gland, trachea, and esophagus demonstrate no significant findings. Centrilobular emphysematous changes seen within both lung apices. There is minimal  peripheral patchy airspace opacity seen within the right upper lobe and anterior left upper lobe with honeycombing as on prior exam. There is a rounded ill-defined airspace opacity measuring 3.1 cm at the posterior right lung base. Ground-glass opacities seen at both lung bases. Upper Abdomen: No acute abnormalities present in the visualized portions of the upper abdomen. Musculoskeletal: No chest wall abnormality. No acute or significant osseous findings. Review of the MIP images confirms the above findings. IMPRESSION: 1. No central, segmental, or subsegmental embolism. 2. Rounded airspace consolidation in the posterior right lung base, likely due to pneumonia. However short interval follow-up is recommended in 3-4 weeks following trial of antibiotic therapy to ensure resolution and exclude underlying malignancy. 3.  Emphysema (ICD10-J43.9). 4.  Aortic Atherosclerosis (ICD10-I70.0). Electronically Signed   By: Prudencio Pair M.D.   On: 06/12/2020 15:16   CT CERVICAL SPINE WO CONTRAST  Result Date: 06/20/2020 CLINICAL DATA:  Weakness. EXAM: CT HEAD WITHOUT CONTRAST CT CERVICAL SPINE WITHOUT CONTRAST TECHNIQUE: Multidetector CT imaging of the head and cervical spine was performed following the standard protocol without intravenous contrast. Multiplanar CT image reconstructions of the cervical spine were also generated. COMPARISON:  None. FINDINGS: CT HEAD FINDINGS Brain: Mild diffuse cortical atrophy is noted. Mild chronic ischemic [place] matter disease is noted. No mass effect or midline shift is noted. Ventricular size is within normal limits. There is no evidence of mass lesion, hemorrhage or acute infarction. Vascular: No hyperdense vessel or unexpected calcification. Skull: Normal. Negative for  fracture or focal lesion. Sinuses/Orbits: No acute finding. Other: None. CT CERVICAL SPINE FINDINGS Alignment: Minimal grade 1 anterolisthesis of C4-5 is noted secondary to posterior facet joint hypertrophy. Skull base  and vertebrae: No acute fracture. No primary bone lesion or focal pathologic process. Soft tissues and spinal canal: No prevertebral fluid or swelling. No visible canal hematoma. Disc levels: Severe degenerative disc disease is noted at C5-6. Moderate degenerative disc disease is noted at C6-7. Upper chest: Negative. Other: None. IMPRESSION: 1. Mild diffuse cortical atrophy. Mild chronic ischemic [place] matter disease. No acute intracranial abnormality seen. 2. Multilevel degenerative disc disease. No acute abnormality seen in the cervical spine. Electronically Signed   By: Marijo Conception M.D.   On: 06/20/2020 17:24   DG Chest Port 1 View  Result Date: 06/20/2020 CLINICAL DATA:  Weakness. EXAM: PORTABLE CHEST 1 VIEW COMPARISON:  January 01, 2016 FINDINGS: The lungs are hyperinflated. Emphysematous lung disease is seen with chronic appearing increased interstitial lung markings. There is no evidence of focal consolidation, pleural effusion or pneumothorax. The heart size and mediastinal contours are within normal limits. There is mild calcification of the aortic arch. The visualized skeletal structures are unremarkable. IMPRESSION: COPD without acute or active cardiopulmonary disease. Electronically Signed   By: Virgina Norfolk M.D.   On: 06/20/2020 16:21      Subjective: No acute issues or events overnight denies nausea vomiting diarrhea constipation headache fevers or chills.   Discharge Exam: Vitals:   06/30/20 2053 07/01/20 0509  BP: 122/63 (!) 151/68  Pulse: 66 82  Resp: 18 19  Temp: 97.6 F (36.4 C) 98.3 F (36.8 C)  SpO2: 95% 95%   Vitals:   06/30/20 0905 06/30/20 1812 06/30/20 2053 07/01/20 0509  BP: (!) 152/63 (!) 124/49 122/63 (!) 151/68  Pulse:  83 66 82  Resp:  (!) 23 18 19   Temp:  98 F (36.7 C) 97.6 F (36.4 C) 98.3 F (36.8 C)  TempSrc:  Oral Oral Oral  SpO2:  94% 95% 95%  Weight:      Height:        General: Pt is alert, awake, not in acute  distress Cardiovascular: RRR, S1/S2 +, no rubs, no gallops Respiratory: CTA bilaterally, no wheezing, no rhonchi Abdominal: Soft, NT, ND, bowel sounds + Extremities: no edema, no cyanosis    The results of significant diagnostics from this hospitalization (including imaging, microbiology, ancillary and laboratory) are listed below for reference.     Microbiology: No results found for this or any previous visit (from the past 240 hour(s)).   Labs: BNP (last 3 results) No results for input(s): BNP in the last 8760 hours. Basic Metabolic Panel: Recent Labs  Lab 06/25/20 0440  NA 136  K 3.6  CL 103  CO2 26  GLUCOSE 117*  BUN 25*  CREATININE 0.52  CALCIUM 7.7*  MG 2.2   Liver Function Tests: Recent Labs  Lab 06/25/20 0440  AST 44*  ALT 36  ALKPHOS 66  BILITOT 0.9  PROT 4.6*  ALBUMIN 2.4*   No results for input(s): LIPASE, AMYLASE in the last 168 hours. No results for input(s): AMMONIA in the last 168 hours. CBC: Recent Labs  Lab 06/25/20 0440  WBC 7.9  NEUTROABS 5.7  HGB 13.4  HCT 41.0  MCV 88.9  PLT 359   Cardiac Enzymes: No results for input(s): CKTOTAL, CKMB, CKMBINDEX, TROPONINI in the last 168 hours. BNP: Invalid input(s): POCBNP CBG: No results for input(s): GLUCAP in the last  168 hours. D-Dimer No results for input(s): DDIMER in the last 72 hours. Hgb A1c No results for input(s): HGBA1C in the last 72 hours. Lipid Profile No results for input(s): CHOL, HDL, LDLCALC, TRIG, CHOLHDL, LDLDIRECT in the last 72 hours. Thyroid function studies No results for input(s): TSH, T4TOTAL, T3FREE, THYROIDAB in the last 72 hours.  Invalid input(s): FREET3 Anemia work up No results for input(s): VITAMINB12, FOLATE, FERRITIN, TIBC, IRON, RETICCTPCT in the last 72 hours. Urinalysis    Component Value Date/Time   COLORURINE YELLOW 06/21/2020 0200   APPEARANCEUR CLEAR 06/21/2020 0200   LABSPEC 1.009 06/21/2020 0200   PHURINE 6.0 06/21/2020 0200   GLUCOSEU  NEGATIVE 06/21/2020 0200   HGBUR NEGATIVE 06/21/2020 0200   BILIRUBINUR NEGATIVE 06/21/2020 0200   KETONESUR 20 (A) 06/21/2020 0200   PROTEINUR NEGATIVE 06/21/2020 0200   NITRITE NEGATIVE 06/21/2020 0200   LEUKOCYTESUR NEGATIVE 06/21/2020 0200   Sepsis Labs Invalid input(s): PROCALCITONIN,  WBC,  LACTICIDVEN Microbiology No results found for this or any previous visit (from the past 240 hour(s)).   Time coordinating discharge: Over 30 minutes  SIGNED:   Little Ishikawa, DO [facility] 07/01/2020, 7:54 AM Pager   If 7PM-7AM, please contact night-coverage [email or web address]

## 2020-07-01 NOTE — TOC Progression Note (Signed)
Transition of Care [facility]) - Progression Note    Patient Details  Name: Melanie Walker MRN: [id number] Date of Birth: [birth year]  Transition of Care [facility]) CM/SW Contact  Purcell Mouton, RN Phone Number: 07/01/2020, 4:26 PM  Clinical Narrative:     Continue to wait on insurance authorization for pt.   Expected Discharge Plan: [place] Barriers to Discharge: No Barriers Identified  Expected Discharge Plan and Services Expected Discharge Plan: [place] arrangements for the past 2 months: Single Family Home Expected Discharge Date: 07/01/20                                     Social Determinants of Health (SDOH) Interventions    Readmission Risk Interventions No flowsheet data found.

## 2020-07-01 NOTE — Progress Notes (Signed)
PROGRESS NOTE    [name]  ELT:[id number] DOB: 1930-10-15 DOA: 06/20/2020 PCP: Lajean Manes, MD    Brief Narrative:  Melanie Walker is an 87 year old female with past medical history significant for COPD, essential hypertension who presented to the ED with progressive generalized weakness, shortness of breath with multiple falls and confusion.  Patient is a poor historian from suspect encephalopathy and history obtained from both the patient and daughter who is present at bedside as well as discussions with emergency department staff. Dyspnea worse with exertion and improved with rest. Also patient with lower abdominal pain with difficulty urinating. Recently diagnosed with a urinary tract infection and completed course of ciprofloxacin. Although believe her symptoms are related to urinary retention. In the ED patient required supplemental oxygen up to 4 L nasal cannula with notable bilateral peripheral infiltrate in the right lower lobe most notably. Procalcitonin less than 0.10. SARS-CoV-2/Covid-19 PCR positive. Hospitalist service consulted for further evaluation of acute hypoxic respite failure secondary to Covid-19 viral pneumonia.  Assessment & Plan:   Principal Problem:   Acute respiratory failure due to COVID-19 [facility]) Active Problems:   Complex partial seizure ([place])   Essential hypertension   Acute urinary retention   Acute metabolic encephalopathy   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HCC)   COPD with emphysema (HCC)   Acute hypoxic respiratory failure secondary to acute Covid-19 viral pneumonia during the ongoing Covid 19 Pandemic - POA - Unvaccinated with notable covid + swab and hypoxia.  - Remdesivir completed - Baricitinib 4mg  PO daily through 07/04/20 - consider discontinuing prior to discharge given profound improvement in symptoms - IV Solumedrol now transitioned to prednisone 20mg  PO daily - will taper at discharge - Continue precautions through the weekend SpO2: 91  % O2 Flow Rate (L/min): 1 L/min No results for input(s): DDIMER, FERRITIN, LDH, CRP in the last 72 hours.  Ambulatory dysfunction, acute  -Likely second to above, continue PT OT awaiting SNF placement  Urinary outlet obstruction -Foley reinserted 3/26 - follow with outpatient urology  Essential hypertension -Amlodipine 10 mg p.o. daily  COPD Lung nodules - Not oxygen dependent at baseline. - Recently diagnosed with spiculated lung nodule right lower lobe measuring 2.6 x 2.6 cm, spiculated nodule medial right lower lobe measuring 1.6 x 1.5 cm without evidence of hilar/mediastinal lymphadenopathy.  Is currently being followed by pulmonary outpatient, Dr. Valeta Harms.  Pending PET-CT and CT chest without contrast in the outpatient setting. - Outpatient follow-up with pulmonology  DVT prophylaxis: Lovenox   Code Status: Full Code Family Communication: Terri at bedside  Disposition Plan:  Level of care: Med-Surg Status is: Inpatient  Remains inpatient appropriate because:Ongoing diagnostic testing needed not appropriate for outpatient work up, Unsafe d/c plan, IV treatments appropriate due to intensity of illness or inability to take PO and Inpatient level of care appropriate due to severity of illness   Dispo: The patient is from: Home              Anticipated d/c is to: SNF              Patient currently is medically stable to d/c.   Difficult to place patient No  Consultants:   none  Procedures:   none  Antimicrobials:   none   Subjective: No acute issues or events overnight, denies headache, fevers, chills - excited for discharge.   Objective: Vitals:   06/30/20 2053 07/01/20 0509 07/01/20 0745 07/01/20 0800  BP: 122/63 (!) 151/68    Pulse:  66 82    Resp: 18 19    Temp: 97.6 F (36.4 C) 98.3 F (36.8 C)    TempSrc: Oral Oral    SpO2: 95% 95% 95% 91%  Weight:      Height:        Intake/Output Summary (Last 24 hours) at 07/01/2020 1343 Last data filed at  07/01/2020 0829 Gross per 24 hour  Intake 820 ml  Output 700 ml  Net 120 ml   Filed Weights   06/21/20 1514  Weight: 46.4 kg    Examination:  General exam: Appears calm and comfortable Respiratory system: Clear to auscultation. Respiratory effort normal.  On 2 L nasal cannula Cardiovascular system: S1 & S2 heard, RRR. No JVD, murmurs, rubs, gallops or clicks. No pedal edema. Gastrointestinal system: Abdomen is nondistended, soft and nontender. No organomegaly or masses felt. Normal bowel sounds heard. Central nervous system: Alert and oriented. No focal neurological deficits. Extremities: Symmetric 5 x 5 power. Skin: No rashes, lesions or ulcers  Data Reviewed: I have personally reviewed following labs and imaging studies  CBC: Recent Labs  Lab 06/25/20 0440  WBC 7.9  NEUTROABS 5.7  HGB 13.4  HCT 41.0  MCV 88.9  PLT 774   Basic Metabolic Panel: Recent Labs  Lab 06/25/20 0440  NA 136  K 3.6  CL 103  CO2 26  GLUCOSE 117*  BUN 25*  CREATININE 0.52  CALCIUM 7.7*  MG 2.2   GFR: Estimated Creatinine Clearance: 33.8 mL/min (by C-G formula based on SCr of 0.52 mg/dL). Liver Function Tests: Recent Labs  Lab 06/25/20 0440  AST 44*  ALT 36  ALKPHOS 66  BILITOT 0.9  PROT 4.6*  ALBUMIN 2.4*   No results for input(s): LIPASE, AMYLASE in the last 168 hours. No results for input(s): AMMONIA in the last 168 hours. Coagulation Profile: No results for input(s): INR, PROTIME in the last 168 hours. Cardiac Enzymes: No results for input(s): CKTOTAL, CKMB, CKMBINDEX, TROPONINI in the last 168 hours. BNP (last 3 results) No results for input(s): PROBNP in the last 8760 hours. HbA1C: No results for input(s): HGBA1C in the last 72 hours. CBG: No results for input(s): GLUCAP in the last 168 hours. Lipid Profile: No results for input(s): CHOL, HDL, LDLCALC, TRIG, CHOLHDL, LDLDIRECT in the last 72 hours. Thyroid Function Tests: No results for input(s): TSH, T4TOTAL,  FREET4, T3FREE, THYROIDAB in the last 72 hours. Anemia Panel: No results for input(s): VITAMINB12, FOLATE, FERRITIN, TIBC, IRON, RETICCTPCT in the last 72 hours. Sepsis Labs: No results for input(s): PROCALCITON, LATICACIDVEN in the last 168 hours.  No results found for this or any previous visit (from the past 240 hour(s)). Radiology Studies: No results found.  Scheduled Meds: . amLODipine  10 mg Oral Daily  . vitamin C  500 mg Oral Daily  . baricitinib  4 mg Oral Daily  . enoxaparin (LOVENOX) injection  40 mg Subcutaneous Q24H  . lactobacillus acidophilus & bulgar  1 tablet Oral TID WC  . loratadine  10 mg Oral Daily  . zinc sulfate  220 mg Oral Daily   Continuous Infusions:   LOS: 10 days   Time spent: 40 min  Little Ishikawa, DO  [facility] Available via Epic secure chat 7am-7pm After these hours, please refer to coverage provider listed on [email or web address] 07/01/2020, 1:43 PM

## 2020-07-01 NOTE — Progress Notes (Signed)
Physical Therapy Treatment Patient Details Name: Melanie Walker MRN: [id number] DOB: 1931/03/15 Today's Date: 07/01/2020    History of Present Illness 87 year old female with past medical history significant for COPD, essential hypertension who presented to the ED with progressive generalized weakness, shortness of breath with multiple falls and confusion.  Patient is a poor historian from suspect encephalopathy and history obtained from both the patient and daughter who is present at bedside as well as discussions with emergency department staff.  Dyspnea worse with exertion and improved with rest.  Also patient with lower abdominal pain with difficulty urinating.  Recently diagnosed with a urinary tract infection and completed course of ciprofloxacin.  Although believe her symptoms are related to urinary retention.  Pt admitted for acute hypoxic respiratory failure secondary to Covid-19 viral pneumonia.    PT Comments    Pt in recliner on arrival and reports fatigue today.  Pt agreeable to mobilize and ambulated within room 14 feet.  Pt overall min assist for mobility today and presents with limited endurance and generalized weakness.  Pt would benefit from post acute rehab in SNF setting.   Follow Up Recommendations  SNF     Equipment Recommendations  None recommended by PT    Recommendations for Other Services       Precautions / Restrictions Precautions Precautions: Fall Precaution Comments: monitor O2, on 1 L (ext tubing)    Mobility  Bed Mobility Overal bed mobility: Needs Assistance Bed Mobility: Sit to Supine       Sit to supine: Min guard;HOB elevated   General bed mobility comments: pt able to assist with repositioning provided increased time    Transfers Overall transfer level: Needs assistance Equipment used: Rolling walker (2 wheeled) Transfers: Sit to/from Stand Sit to Stand: Min assist         General transfer comment: assist to rise and steady, assist  also to control descent as pt eager to return to bed requiring safety cues  Ambulation/Gait Ambulation/Gait assistance: Min guard Gait Distance (Feet): 13 Feet Assistive device: Rolling walker (2 wheeled) Gait Pattern/deviations: Step-through pattern;Decreased stride length     General Gait Details: short cautious steps, pt worried about "slick" floor despite grip socks in place, encouraged distance however pt ambulated in room and returned to bed, pt reports fatigue; SPo2 88% on 1L O2 [place] upon returning to bed; 91% on 1L upon therapist leaving room   Stairs             Wheelchair Mobility    Modified Rankin (Stroke Patients Only)       Balance Overall balance assessment: Needs assistance         Standing balance support: Bilateral upper extremity supported Standing balance-Leahy Scale: Poor Standing balance comment: reliant on UEs                            Cognition Arousal/Alertness: Awake/alert Behavior During Therapy: WFL for tasks assessed/performed   Area of Impairment: Safety/judgement                         Safety/Judgement: Decreased awareness of safety     General Comments: Pt slightly impulsive with return to bed, cues for hand placement and safety      Exercises      General Comments        Pertinent Vitals/Pain Pain Assessment: 0-10 Pain Score: 5  Pain Location: Bil knees to feet  due to chronic neuropathy. Pain Descriptors / Indicators: Tingling;Tender Pain Intervention(s): Repositioned;Monitored during session (RN aware pt also requesting muscle relaxer)    Home Living                      Prior Function            PT Goals (current goals can now be found in the care plan section) Progress towards PT goals: Progressing toward goals    Frequency    Min 2X/week      PT Plan Current plan remains appropriate    Co-evaluation              AM-PAC PT "6 Clicks" Mobility   Outcome  Measure  Help needed turning from your back to your side while in a flat bed without using bedrails?: A Little Help needed moving from lying on your back to sitting on the side of a flat bed without using bedrails?: A Little Help needed moving to and from a bed to a chair (including a wheelchair)?: A Little Help needed standing up from a chair using your arms (e.g., wheelchair or bedside chair)?: A Little Help needed to walk in hospital room?: A Little Help needed climbing 3-5 steps with a railing? : A Lot 6 Click Score: 17    End of Session Equipment Utilized During Treatment: Oxygen Activity Tolerance: Patient limited by fatigue Patient left: with call bell/phone within reach;in bed;with bed alarm set Nurse Communication: Mobility status PT Visit Diagnosis: Other abnormalities of gait and mobility (R26.89)     Time: 3202-3343 PT Time Calculation (min) (ACUTE ONLY): 17 min  Charges:  $Gait Training: 8-22 mins                     Arlyce Dice, DPT Acute Rehabilitation Services Pager: [phone] Office: [phone]  York Ram E 07/01/2020, 1:59 PM

## 2020-07-01 NOTE — Care Management Important Message (Signed)
Important Message  Patient Details IM Letter placed in Patient's door caddy. Name: Melanie Walker MRN: [id number] Date of Birth: 12-22-1930   Medicare Important Message Given:  Yes     Kerin Salen 07/01/2020, 11:25 AM

## 2020-07-01 NOTE — Progress Notes (Signed)
SATURATION QUALIFICATIONS: (This note is used to comply with regulatory documentation for home oxygen)  Patient Saturations on Room Air at Rest = 91%  Patient Saturations on Room Air while Ambulating = 85%  Patient Saturations on 1 Liters of oxygen while Ambulating = 90%  Please briefly explain why patient needs home oxygen: desat with exertion

## 2020-07-02 DIAGNOSIS — M2550 Pain in unspecified joint: Secondary | ICD-10-CM | POA: Diagnosis not present

## 2020-07-02 DIAGNOSIS — G9340 Encephalopathy, unspecified: Secondary | ICD-10-CM | POA: Diagnosis not present

## 2020-07-02 DIAGNOSIS — G9341 Metabolic encephalopathy: Secondary | ICD-10-CM | POA: Diagnosis not present

## 2020-07-02 DIAGNOSIS — R5383 Other fatigue: Secondary | ICD-10-CM | POA: Diagnosis not present

## 2020-07-02 DIAGNOSIS — J9601 Acute respiratory failure with hypoxia: Secondary | ICD-10-CM | POA: Diagnosis not present

## 2020-07-02 DIAGNOSIS — R630 Anorexia: Secondary | ICD-10-CM | POA: Diagnosis not present

## 2020-07-02 DIAGNOSIS — Z7401 Bed confinement status: Secondary | ICD-10-CM | POA: Diagnosis not present

## 2020-07-02 DIAGNOSIS — G479 Sleep disorder, unspecified: Secondary | ICD-10-CM | POA: Diagnosis not present

## 2020-07-02 DIAGNOSIS — J449 Chronic obstructive pulmonary disease, unspecified: Secondary | ICD-10-CM | POA: Diagnosis not present

## 2020-07-02 DIAGNOSIS — M79661 Pain in right lower leg: Secondary | ICD-10-CM | POA: Diagnosis not present

## 2020-07-02 DIAGNOSIS — I959 Hypotension, unspecified: Secondary | ICD-10-CM | POA: Diagnosis not present

## 2020-07-02 DIAGNOSIS — N390 Urinary tract infection, site not specified: Secondary | ICD-10-CM | POA: Diagnosis not present

## 2020-07-02 DIAGNOSIS — R2689 Other abnormalities of gait and mobility: Secondary | ICD-10-CM | POA: Diagnosis not present

## 2020-07-02 DIAGNOSIS — W1789XA Other fall from one level to another, initial encounter: Secondary | ICD-10-CM | POA: Diagnosis not present

## 2020-07-02 DIAGNOSIS — R2681 Unsteadiness on feet: Secondary | ICD-10-CM | POA: Diagnosis not present

## 2020-07-02 DIAGNOSIS — K5901 Slow transit constipation: Secondary | ICD-10-CM | POA: Diagnosis not present

## 2020-07-02 DIAGNOSIS — J1282 Pneumonia due to coronavirus disease 2019: Secondary | ICD-10-CM | POA: Diagnosis not present

## 2020-07-02 DIAGNOSIS — R0602 Shortness of breath: Secondary | ICD-10-CM | POA: Diagnosis not present

## 2020-07-02 DIAGNOSIS — R634 Abnormal weight loss: Secondary | ICD-10-CM | POA: Diagnosis not present

## 2020-07-02 DIAGNOSIS — M6281 Muscle weakness (generalized): Secondary | ICD-10-CM | POA: Diagnosis not present

## 2020-07-02 DIAGNOSIS — J8410 Pulmonary fibrosis, unspecified: Secondary | ICD-10-CM | POA: Diagnosis not present

## 2020-07-02 DIAGNOSIS — R262 Difficulty in walking, not elsewhere classified: Secondary | ICD-10-CM | POA: Diagnosis not present

## 2020-07-02 DIAGNOSIS — R41841 Cognitive communication deficit: Secondary | ICD-10-CM | POA: Diagnosis not present

## 2020-07-02 DIAGNOSIS — R3915 Urgency of urination: Secondary | ICD-10-CM | POA: Diagnosis not present

## 2020-07-02 DIAGNOSIS — C3491 Malignant neoplasm of unspecified part of right bronchus or lung: Secondary | ICD-10-CM | POA: Diagnosis not present

## 2020-07-02 DIAGNOSIS — J984 Other disorders of lung: Secondary | ICD-10-CM | POA: Diagnosis not present

## 2020-07-02 DIAGNOSIS — M25552 Pain in left hip: Secondary | ICD-10-CM | POA: Diagnosis not present

## 2020-07-02 DIAGNOSIS — J849 Interstitial pulmonary disease, unspecified: Secondary | ICD-10-CM | POA: Diagnosis not present

## 2020-07-02 DIAGNOSIS — U071 COVID-19: Secondary | ICD-10-CM | POA: Diagnosis not present

## 2020-07-02 DIAGNOSIS — R3989 Other symptoms and signs involving the genitourinary system: Secondary | ICD-10-CM | POA: Diagnosis not present

## 2020-07-02 DIAGNOSIS — R5381 Other malaise: Secondary | ICD-10-CM | POA: Diagnosis not present

## 2020-07-02 DIAGNOSIS — W19XXXA Unspecified fall, initial encounter: Secondary | ICD-10-CM | POA: Diagnosis not present

## 2020-07-02 DIAGNOSIS — R1310 Dysphagia, unspecified: Secondary | ICD-10-CM | POA: Diagnosis not present

## 2020-07-02 DIAGNOSIS — M79652 Pain in left thigh: Secondary | ICD-10-CM | POA: Diagnosis not present

## 2020-07-02 DIAGNOSIS — L989 Disorder of the skin and subcutaneous tissue, unspecified: Secondary | ICD-10-CM | POA: Diagnosis not present

## 2020-07-02 DIAGNOSIS — G6289 Other specified polyneuropathies: Secondary | ICD-10-CM | POA: Diagnosis not present

## 2020-07-02 DIAGNOSIS — R498 Other voice and resonance disorders: Secondary | ICD-10-CM | POA: Diagnosis not present

## 2020-07-02 DIAGNOSIS — D519 Vitamin B12 deficiency anemia, unspecified: Secondary | ICD-10-CM | POA: Diagnosis not present

## 2020-07-02 NOTE — Progress Notes (Signed)
This RN attempted to call report to [facility] [phone] and they reported the nurse was in a meeting. I gave my name and number for her to return my call when she gets out of her meeting. I informed facility that pt would be leaving here within the next 30-45 minutes per PTAR's recommended pick up time.

## 2020-07-02 NOTE — Progress Notes (Signed)
This RN gave report to [place], LPN at [facility]. All questions addressed.

## 2020-07-02 NOTE — TOC Progression Note (Signed)
Transition of Care [facility]) - Progression Note    Patient Details  Name: Melanie Walker MRN: [id number] Date of Birth: 11/04/1930  Transition of Care [facility]) CM/SW Contact  Purcell Mouton, RN Phone Number: 07/02/2020, 10:56 AM  Clinical Narrative:    Melanie Walker was called. RN and daughter is aware.    Expected Discharge Plan: [place] Barriers to Discharge: No Barriers Identified  Expected Discharge Plan and Services Expected Discharge Plan: [place] arrangements for the past 2 months: Single Family Home Expected Discharge Date: 07/02/20                                     Social Determinants of Health (SDOH) Interventions    Readmission Risk Interventions No flowsheet data found.

## 2020-07-04 DIAGNOSIS — J9601 Acute respiratory failure with hypoxia: Secondary | ICD-10-CM | POA: Diagnosis not present

## 2020-07-04 DIAGNOSIS — G9340 Encephalopathy, unspecified: Secondary | ICD-10-CM | POA: Diagnosis not present

## 2020-07-04 DIAGNOSIS — U071 COVID-19: Secondary | ICD-10-CM | POA: Diagnosis not present

## 2020-07-04 DIAGNOSIS — J1282 Pneumonia due to coronavirus disease 2019: Secondary | ICD-10-CM | POA: Diagnosis not present

## 2020-07-05 DIAGNOSIS — J1282 Pneumonia due to coronavirus disease 2019: Secondary | ICD-10-CM | POA: Diagnosis not present

## 2020-07-05 DIAGNOSIS — R3989 Other symptoms and signs involving the genitourinary system: Secondary | ICD-10-CM | POA: Diagnosis not present

## 2020-07-05 DIAGNOSIS — W1789XA Other fall from one level to another, initial encounter: Secondary | ICD-10-CM | POA: Diagnosis not present

## 2020-07-05 DIAGNOSIS — R498 Other voice and resonance disorders: Secondary | ICD-10-CM | POA: Diagnosis not present

## 2020-07-05 DIAGNOSIS — D519 Vitamin B12 deficiency anemia, unspecified: Secondary | ICD-10-CM | POA: Diagnosis not present

## 2020-07-05 DIAGNOSIS — R1310 Dysphagia, unspecified: Secondary | ICD-10-CM | POA: Diagnosis not present

## 2020-07-05 DIAGNOSIS — R41841 Cognitive communication deficit: Secondary | ICD-10-CM | POA: Diagnosis not present

## 2020-07-05 DIAGNOSIS — R262 Difficulty in walking, not elsewhere classified: Secondary | ICD-10-CM | POA: Diagnosis not present

## 2020-07-05 DIAGNOSIS — M25552 Pain in left hip: Secondary | ICD-10-CM | POA: Diagnosis not present

## 2020-07-05 DIAGNOSIS — R2681 Unsteadiness on feet: Secondary | ICD-10-CM | POA: Diagnosis not present

## 2020-07-05 DIAGNOSIS — G9341 Metabolic encephalopathy: Secondary | ICD-10-CM | POA: Diagnosis not present

## 2020-07-05 DIAGNOSIS — J984 Other disorders of lung: Secondary | ICD-10-CM | POA: Diagnosis not present

## 2020-07-05 DIAGNOSIS — R0602 Shortness of breath: Secondary | ICD-10-CM | POA: Diagnosis not present

## 2020-07-05 DIAGNOSIS — G479 Sleep disorder, unspecified: Secondary | ICD-10-CM | POA: Diagnosis not present

## 2020-07-05 DIAGNOSIS — M79652 Pain in left thigh: Secondary | ICD-10-CM | POA: Diagnosis not present

## 2020-07-05 DIAGNOSIS — G6289 Other specified polyneuropathies: Secondary | ICD-10-CM | POA: Diagnosis not present

## 2020-07-05 DIAGNOSIS — N390 Urinary tract infection, site not specified: Secondary | ICD-10-CM | POA: Diagnosis not present

## 2020-07-05 DIAGNOSIS — K5901 Slow transit constipation: Secondary | ICD-10-CM | POA: Diagnosis not present

## 2020-07-05 DIAGNOSIS — J449 Chronic obstructive pulmonary disease, unspecified: Secondary | ICD-10-CM | POA: Diagnosis not present

## 2020-07-05 DIAGNOSIS — R634 Abnormal weight loss: Secondary | ICD-10-CM | POA: Diagnosis not present

## 2020-07-05 DIAGNOSIS — R5383 Other fatigue: Secondary | ICD-10-CM | POA: Diagnosis not present

## 2020-07-05 DIAGNOSIS — U071 COVID-19: Secondary | ICD-10-CM | POA: Diagnosis not present

## 2020-07-05 DIAGNOSIS — M6281 Muscle weakness (generalized): Secondary | ICD-10-CM | POA: Diagnosis not present

## 2020-07-05 DIAGNOSIS — L989 Disorder of the skin and subcutaneous tissue, unspecified: Secondary | ICD-10-CM | POA: Diagnosis not present

## 2020-07-05 DIAGNOSIS — R630 Anorexia: Secondary | ICD-10-CM | POA: Diagnosis not present

## 2020-07-05 DIAGNOSIS — J849 Interstitial pulmonary disease, unspecified: Secondary | ICD-10-CM | POA: Diagnosis not present

## 2020-07-05 DIAGNOSIS — R5381 Other malaise: Secondary | ICD-10-CM | POA: Diagnosis not present

## 2020-07-05 DIAGNOSIS — J9601 Acute respiratory failure with hypoxia: Secondary | ICD-10-CM | POA: Diagnosis not present

## 2020-07-05 DIAGNOSIS — M79661 Pain in right lower leg: Secondary | ICD-10-CM | POA: Diagnosis not present

## 2020-07-05 DIAGNOSIS — C3491 Malignant neoplasm of unspecified part of right bronchus or lung: Secondary | ICD-10-CM | POA: Diagnosis not present

## 2020-07-05 DIAGNOSIS — J8410 Pulmonary fibrosis, unspecified: Secondary | ICD-10-CM | POA: Diagnosis not present

## 2020-07-05 DIAGNOSIS — R2689 Other abnormalities of gait and mobility: Secondary | ICD-10-CM | POA: Diagnosis not present

## 2020-07-05 DIAGNOSIS — W19XXXA Unspecified fall, initial encounter: Secondary | ICD-10-CM | POA: Diagnosis not present

## 2020-07-09 DIAGNOSIS — J1282 Pneumonia due to coronavirus disease 2019: Secondary | ICD-10-CM | POA: Diagnosis not present

## 2020-07-09 DIAGNOSIS — J9601 Acute respiratory failure with hypoxia: Secondary | ICD-10-CM | POA: Diagnosis not present

## 2020-07-09 DIAGNOSIS — J449 Chronic obstructive pulmonary disease, unspecified: Secondary | ICD-10-CM | POA: Diagnosis not present

## 2020-07-09 DIAGNOSIS — U071 COVID-19: Secondary | ICD-10-CM | POA: Diagnosis not present

## 2020-07-12 DIAGNOSIS — G6289 Other specified polyneuropathies: Secondary | ICD-10-CM | POA: Diagnosis not present

## 2020-07-12 DIAGNOSIS — C3491 Malignant neoplasm of unspecified part of right bronchus or lung: Secondary | ICD-10-CM | POA: Diagnosis not present

## 2020-07-12 DIAGNOSIS — J984 Other disorders of lung: Secondary | ICD-10-CM | POA: Diagnosis not present

## 2020-07-12 DIAGNOSIS — R3989 Other symptoms and signs involving the genitourinary system: Secondary | ICD-10-CM | POA: Diagnosis not present

## 2020-07-12 DIAGNOSIS — J8410 Pulmonary fibrosis, unspecified: Secondary | ICD-10-CM | POA: Diagnosis not present

## 2020-07-23 DIAGNOSIS — G479 Sleep disorder, unspecified: Secondary | ICD-10-CM | POA: Diagnosis not present

## 2020-07-23 DIAGNOSIS — U071 COVID-19: Secondary | ICD-10-CM | POA: Diagnosis not present

## 2020-07-23 DIAGNOSIS — J849 Interstitial pulmonary disease, unspecified: Secondary | ICD-10-CM | POA: Diagnosis not present

## 2020-07-23 DIAGNOSIS — R5381 Other malaise: Secondary | ICD-10-CM | POA: Diagnosis not present

## 2020-07-23 DIAGNOSIS — R634 Abnormal weight loss: Secondary | ICD-10-CM | POA: Diagnosis not present

## 2020-07-23 DIAGNOSIS — J984 Other disorders of lung: Secondary | ICD-10-CM | POA: Diagnosis not present

## 2020-07-23 DIAGNOSIS — J9601 Acute respiratory failure with hypoxia: Secondary | ICD-10-CM | POA: Diagnosis not present

## 2020-07-24 DIAGNOSIS — R5381 Other malaise: Secondary | ICD-10-CM | POA: Diagnosis not present

## 2020-07-24 DIAGNOSIS — J8410 Pulmonary fibrosis, unspecified: Secondary | ICD-10-CM | POA: Diagnosis not present

## 2020-07-24 DIAGNOSIS — W1789XA Other fall from one level to another, initial encounter: Secondary | ICD-10-CM | POA: Diagnosis not present

## 2020-07-25 DIAGNOSIS — R634 Abnormal weight loss: Secondary | ICD-10-CM | POA: Diagnosis not present

## 2020-07-25 DIAGNOSIS — N390 Urinary tract infection, site not specified: Secondary | ICD-10-CM | POA: Diagnosis not present

## 2020-07-25 DIAGNOSIS — L989 Disorder of the skin and subcutaneous tissue, unspecified: Secondary | ICD-10-CM | POA: Diagnosis not present

## 2020-07-30 DIAGNOSIS — K5901 Slow transit constipation: Secondary | ICD-10-CM | POA: Diagnosis not present

## 2020-07-30 DIAGNOSIS — C3491 Malignant neoplasm of unspecified part of right bronchus or lung: Secondary | ICD-10-CM | POA: Diagnosis not present

## 2020-07-30 DIAGNOSIS — R5383 Other fatigue: Secondary | ICD-10-CM | POA: Diagnosis not present

## 2020-07-30 DIAGNOSIS — R630 Anorexia: Secondary | ICD-10-CM | POA: Diagnosis not present

## 2020-08-01 DIAGNOSIS — R5381 Other malaise: Secondary | ICD-10-CM | POA: Diagnosis not present

## 2020-08-01 DIAGNOSIS — C3491 Malignant neoplasm of unspecified part of right bronchus or lung: Secondary | ICD-10-CM | POA: Diagnosis not present

## 2020-08-01 DIAGNOSIS — J9601 Acute respiratory failure with hypoxia: Secondary | ICD-10-CM | POA: Diagnosis not present

## 2020-08-01 DIAGNOSIS — W19XXXA Unspecified fall, initial encounter: Secondary | ICD-10-CM | POA: Diagnosis not present

## 2020-08-05 DIAGNOSIS — K5909 Other constipation: Secondary | ICD-10-CM | POA: Diagnosis not present

## 2020-08-06 DIAGNOSIS — K5909 Other constipation: Secondary | ICD-10-CM | POA: Diagnosis not present

## 2020-08-06 DIAGNOSIS — H1089 Other conjunctivitis: Secondary | ICD-10-CM | POA: Diagnosis not present

## 2020-08-09 DIAGNOSIS — J9601 Acute respiratory failure with hypoxia: Secondary | ICD-10-CM | POA: Diagnosis not present

## 2020-08-09 DIAGNOSIS — C3491 Malignant neoplasm of unspecified part of right bronchus or lung: Secondary | ICD-10-CM | POA: Diagnosis not present

## 2020-08-09 DIAGNOSIS — J9600 Acute respiratory failure, unspecified whether with hypoxia or hypercapnia: Secondary | ICD-10-CM | POA: Diagnosis not present

## 2020-08-09 DIAGNOSIS — J449 Chronic obstructive pulmonary disease, unspecified: Secondary | ICD-10-CM | POA: Diagnosis not present

## 2020-08-09 DIAGNOSIS — U071 COVID-19: Secondary | ICD-10-CM | POA: Diagnosis not present

## 2020-08-15 ENCOUNTER — Telehealth: Payer: Self-pay

## 2020-08-15 NOTE — Telephone Encounter (Signed)
Attempted to contact patient's daughter [name]  to schedule a Palliative Care consult appointment. No answer left a message to return call.

## 2020-08-16 ENCOUNTER — Telehealth: Payer: Self-pay

## 2020-08-16 NOTE — Telephone Encounter (Signed)
Spoke with patient's daughter [name] and scheduled an in-person Palliative Consult for 09/10/20 @ 12:30PM  COVID screening was negative. No pets in home. Patient's daughter is staying with her currently.   Consent obtained; updated Outlook/Netsmart/Team List and Epic.   Family is aware they may be receiving a call from NP the day before or day of to confirm appointment.

## 2020-08-22 DIAGNOSIS — I872 Venous insufficiency (chronic) (peripheral): Secondary | ICD-10-CM | POA: Diagnosis not present

## 2020-08-22 DIAGNOSIS — J9601 Acute respiratory failure with hypoxia: Secondary | ICD-10-CM | POA: Diagnosis not present

## 2020-08-22 DIAGNOSIS — J439 Emphysema, unspecified: Secondary | ICD-10-CM | POA: Diagnosis not present

## 2020-08-22 DIAGNOSIS — G40209 Localization-related (focal) (partial) symptomatic epilepsy and epileptic syndromes with complex partial seizures, not intractable, without status epilepticus: Secondary | ICD-10-CM | POA: Diagnosis not present

## 2020-08-22 DIAGNOSIS — G9341 Metabolic encephalopathy: Secondary | ICD-10-CM | POA: Diagnosis not present

## 2020-08-22 DIAGNOSIS — I10 Essential (primary) hypertension: Secondary | ICD-10-CM | POA: Diagnosis not present

## 2020-08-22 DIAGNOSIS — C3491 Malignant neoplasm of unspecified part of right bronchus or lung: Secondary | ICD-10-CM | POA: Diagnosis not present

## 2020-08-22 DIAGNOSIS — J8410 Pulmonary fibrosis, unspecified: Secondary | ICD-10-CM | POA: Diagnosis not present

## 2020-08-22 DIAGNOSIS — G629 Polyneuropathy, unspecified: Secondary | ICD-10-CM | POA: Diagnosis not present

## 2020-08-26 DIAGNOSIS — G9341 Metabolic encephalopathy: Secondary | ICD-10-CM | POA: Diagnosis not present

## 2020-08-26 DIAGNOSIS — J8410 Pulmonary fibrosis, unspecified: Secondary | ICD-10-CM | POA: Diagnosis not present

## 2020-08-26 DIAGNOSIS — I10 Essential (primary) hypertension: Secondary | ICD-10-CM | POA: Diagnosis not present

## 2020-08-26 DIAGNOSIS — G629 Polyneuropathy, unspecified: Secondary | ICD-10-CM | POA: Diagnosis not present

## 2020-08-26 DIAGNOSIS — G40209 Localization-related (focal) (partial) symptomatic epilepsy and epileptic syndromes with complex partial seizures, not intractable, without status epilepticus: Secondary | ICD-10-CM | POA: Diagnosis not present

## 2020-08-26 DIAGNOSIS — J439 Emphysema, unspecified: Secondary | ICD-10-CM | POA: Diagnosis not present

## 2020-08-26 DIAGNOSIS — I872 Venous insufficiency (chronic) (peripheral): Secondary | ICD-10-CM | POA: Diagnosis not present

## 2020-08-26 DIAGNOSIS — J9601 Acute respiratory failure with hypoxia: Secondary | ICD-10-CM | POA: Diagnosis not present

## 2020-08-26 DIAGNOSIS — C3491 Malignant neoplasm of unspecified part of right bronchus or lung: Secondary | ICD-10-CM | POA: Diagnosis not present

## 2020-08-27 DIAGNOSIS — C3491 Malignant neoplasm of unspecified part of right bronchus or lung: Secondary | ICD-10-CM | POA: Diagnosis not present

## 2020-08-27 DIAGNOSIS — G629 Polyneuropathy, unspecified: Secondary | ICD-10-CM | POA: Diagnosis not present

## 2020-08-27 DIAGNOSIS — J439 Emphysema, unspecified: Secondary | ICD-10-CM | POA: Diagnosis not present

## 2020-08-27 DIAGNOSIS — J8410 Pulmonary fibrosis, unspecified: Secondary | ICD-10-CM | POA: Diagnosis not present

## 2020-08-27 DIAGNOSIS — J9601 Acute respiratory failure with hypoxia: Secondary | ICD-10-CM | POA: Diagnosis not present

## 2020-08-27 DIAGNOSIS — G9341 Metabolic encephalopathy: Secondary | ICD-10-CM | POA: Diagnosis not present

## 2020-08-27 DIAGNOSIS — I10 Essential (primary) hypertension: Secondary | ICD-10-CM | POA: Diagnosis not present

## 2020-08-27 DIAGNOSIS — G40209 Localization-related (focal) (partial) symptomatic epilepsy and epileptic syndromes with complex partial seizures, not intractable, without status epilepticus: Secondary | ICD-10-CM | POA: Diagnosis not present

## 2020-08-27 DIAGNOSIS — I872 Venous insufficiency (chronic) (peripheral): Secondary | ICD-10-CM | POA: Diagnosis not present

## 2020-08-29 DIAGNOSIS — J439 Emphysema, unspecified: Secondary | ICD-10-CM | POA: Diagnosis not present

## 2020-08-29 DIAGNOSIS — J8410 Pulmonary fibrosis, unspecified: Secondary | ICD-10-CM | POA: Diagnosis not present

## 2020-08-29 DIAGNOSIS — I872 Venous insufficiency (chronic) (peripheral): Secondary | ICD-10-CM | POA: Diagnosis not present

## 2020-08-29 DIAGNOSIS — I10 Essential (primary) hypertension: Secondary | ICD-10-CM | POA: Diagnosis not present

## 2020-08-29 DIAGNOSIS — J9601 Acute respiratory failure with hypoxia: Secondary | ICD-10-CM | POA: Diagnosis not present

## 2020-08-29 DIAGNOSIS — G40209 Localization-related (focal) (partial) symptomatic epilepsy and epileptic syndromes with complex partial seizures, not intractable, without status epilepticus: Secondary | ICD-10-CM | POA: Diagnosis not present

## 2020-08-29 DIAGNOSIS — G9341 Metabolic encephalopathy: Secondary | ICD-10-CM | POA: Diagnosis not present

## 2020-08-29 DIAGNOSIS — C3491 Malignant neoplasm of unspecified part of right bronchus or lung: Secondary | ICD-10-CM | POA: Diagnosis not present

## 2020-08-29 DIAGNOSIS — G629 Polyneuropathy, unspecified: Secondary | ICD-10-CM | POA: Diagnosis not present

## 2020-08-30 DIAGNOSIS — C3491 Malignant neoplasm of unspecified part of right bronchus or lung: Secondary | ICD-10-CM | POA: Diagnosis not present

## 2020-08-30 DIAGNOSIS — G9341 Metabolic encephalopathy: Secondary | ICD-10-CM | POA: Diagnosis not present

## 2020-08-30 DIAGNOSIS — J439 Emphysema, unspecified: Secondary | ICD-10-CM | POA: Diagnosis not present

## 2020-08-30 DIAGNOSIS — G40209 Localization-related (focal) (partial) symptomatic epilepsy and epileptic syndromes with complex partial seizures, not intractable, without status epilepticus: Secondary | ICD-10-CM | POA: Diagnosis not present

## 2020-08-30 DIAGNOSIS — G629 Polyneuropathy, unspecified: Secondary | ICD-10-CM | POA: Diagnosis not present

## 2020-08-30 DIAGNOSIS — J9601 Acute respiratory failure with hypoxia: Secondary | ICD-10-CM | POA: Diagnosis not present

## 2020-08-30 DIAGNOSIS — I10 Essential (primary) hypertension: Secondary | ICD-10-CM | POA: Diagnosis not present

## 2020-08-30 DIAGNOSIS — J8410 Pulmonary fibrosis, unspecified: Secondary | ICD-10-CM | POA: Diagnosis not present

## 2020-08-30 DIAGNOSIS — I872 Venous insufficiency (chronic) (peripheral): Secondary | ICD-10-CM | POA: Diagnosis not present

## 2020-09-04 DIAGNOSIS — J9601 Acute respiratory failure with hypoxia: Secondary | ICD-10-CM | POA: Diagnosis not present

## 2020-09-04 DIAGNOSIS — C3491 Malignant neoplasm of unspecified part of right bronchus or lung: Secondary | ICD-10-CM | POA: Diagnosis not present

## 2020-09-04 DIAGNOSIS — G629 Polyneuropathy, unspecified: Secondary | ICD-10-CM | POA: Diagnosis not present

## 2020-09-04 DIAGNOSIS — G40209 Localization-related (focal) (partial) symptomatic epilepsy and epileptic syndromes with complex partial seizures, not intractable, without status epilepticus: Secondary | ICD-10-CM | POA: Diagnosis not present

## 2020-09-04 DIAGNOSIS — J8410 Pulmonary fibrosis, unspecified: Secondary | ICD-10-CM | POA: Diagnosis not present

## 2020-09-04 DIAGNOSIS — J439 Emphysema, unspecified: Secondary | ICD-10-CM | POA: Diagnosis not present

## 2020-09-04 DIAGNOSIS — I10 Essential (primary) hypertension: Secondary | ICD-10-CM | POA: Diagnosis not present

## 2020-09-04 DIAGNOSIS — I872 Venous insufficiency (chronic) (peripheral): Secondary | ICD-10-CM | POA: Diagnosis not present

## 2020-09-04 DIAGNOSIS — G9341 Metabolic encephalopathy: Secondary | ICD-10-CM | POA: Diagnosis not present

## 2020-09-09 DIAGNOSIS — G40209 Localization-related (focal) (partial) symptomatic epilepsy and epileptic syndromes with complex partial seizures, not intractable, without status epilepticus: Secondary | ICD-10-CM | POA: Diagnosis not present

## 2020-09-09 DIAGNOSIS — C3491 Malignant neoplasm of unspecified part of right bronchus or lung: Secondary | ICD-10-CM | POA: Diagnosis not present

## 2020-09-09 DIAGNOSIS — J9601 Acute respiratory failure with hypoxia: Secondary | ICD-10-CM | POA: Diagnosis not present

## 2020-09-09 DIAGNOSIS — G629 Polyneuropathy, unspecified: Secondary | ICD-10-CM | POA: Diagnosis not present

## 2020-09-09 DIAGNOSIS — J449 Chronic obstructive pulmonary disease, unspecified: Secondary | ICD-10-CM | POA: Diagnosis not present

## 2020-09-09 DIAGNOSIS — J9600 Acute respiratory failure, unspecified whether with hypoxia or hypercapnia: Secondary | ICD-10-CM | POA: Diagnosis not present

## 2020-09-09 DIAGNOSIS — I872 Venous insufficiency (chronic) (peripheral): Secondary | ICD-10-CM | POA: Diagnosis not present

## 2020-09-09 DIAGNOSIS — G9341 Metabolic encephalopathy: Secondary | ICD-10-CM | POA: Diagnosis not present

## 2020-09-09 DIAGNOSIS — I10 Essential (primary) hypertension: Secondary | ICD-10-CM | POA: Diagnosis not present

## 2020-09-09 DIAGNOSIS — J8410 Pulmonary fibrosis, unspecified: Secondary | ICD-10-CM | POA: Diagnosis not present

## 2020-09-09 DIAGNOSIS — J439 Emphysema, unspecified: Secondary | ICD-10-CM | POA: Diagnosis not present

## 2020-09-10 ENCOUNTER — Other Ambulatory Visit: Payer: Self-pay

## 2020-09-10 ENCOUNTER — Other Ambulatory Visit: Payer: Medicare HMO | Admitting: Nurse Practitioner

## 2020-09-10 VITALS — BP 162/80 | HR 80 | Resp 18 | Ht 59.0 in | Wt 97.0 lb

## 2020-09-10 DIAGNOSIS — R531 Weakness: Secondary | ICD-10-CM | POA: Diagnosis not present

## 2020-09-10 DIAGNOSIS — Z515 Encounter for palliative care: Secondary | ICD-10-CM

## 2020-09-10 DIAGNOSIS — J9611 Chronic respiratory failure with hypoxia: Secondary | ICD-10-CM | POA: Diagnosis not present

## 2020-09-10 DIAGNOSIS — I10 Essential (primary) hypertension: Secondary | ICD-10-CM | POA: Diagnosis not present

## 2020-09-10 NOTE — Progress Notes (Signed)
[place] Consult Note Telephone: [phone]  Fax: [phone]    Date of encounter: 09/10/20 PATIENT NAME: Melanie Walker [id number]   [phone] (home)  DOB: 18-Oct-1930 MRN: [id number]  PRIMARY CARE PROVIDER:    Lajean Manes, MD,  [place] [place] [address] [phone]  REFERRING PROVIDER:   Lajean Manes, MD [address] [place],  [place] [address] [phone]  RESPONSIBLE PARTY:    Contact Information    Name Relation Home Work Mobile   Gupta,Terri Daughter [phone]  [phone]     I met face to face with patient in home.Patient's daughter [name] present during visit. Palliative Care was asked to follow this patient by consultation request of  Lajean Manes, MD to address advance care planning and complex medical decision making. This is the initial visit.                                   ASSESSMENT AND PLAN / RECOMMENDATIONS:   Advance Care Planning/Goals of Care: Goals include to maximize quality of life and symptom management.  Visit consisted of building trust and discussions on Palliative care medicine as specialized medical care for people living with serious illness, aimed at facilitating improved quality of life through symptoms relief, assisting with advance care planning and establishing goals of care. Our advance care planning conversation today included a discussion about:     The value and importance of advance care planning   Experiences with loved ones who have been seriously ill or have died   Exploration of personal, cultural or spiritual beliefs that might influence medical decisions   Exploration of goals of care in the event of a sudden injury or illness   Review and updating or creation of an advance directive document .  CODE STATUS: Full code Goal of care: Patient's goal of care is function. Patient verbalized desire to  get stronger and return to her pre- hospital function where she was able to do for herself. Directives: After discussions on ramifications and implications of code status, patient elected to be a full Code. She reiterated desire for full spectrum of care including mechanical ventilation in the event of cardiac or respiratory arrest. Validation provided. It was explained to patient that code status will be regularly reviewed to ensure that it reflects patient wishes.  Patient and her daughter open to further discussion on code status and possible changes in the future.  Palliative care will continue to provide support to patient, family and the medical team.   I spent 25 minutes providing this consultation. More than 50% of the time in this consultation was spent in counseling and care coordination. -------------------------------------------------------------------------------  Symptom Management/Plan: Generalized weakness: Continue PT/OT. Maintain safety, prevent falls.  Chronic respairatory failure with hypoxia: respiratory failure in the context of COPD and right pulmonary nodule. Patient pending MRI of chest. Patient unsure if she would purse treatment if offered, saying she would make the decision when the time comes. Oxygen saturation today is 98% on 2L oxygen. Per family report, patient desaturates to the 81s with activity. Encouraged to pace self during activity, encouraged to take breaks as needed. Patient advised to schedule a post hospitalization visit with her PCP.  Hypertension: Blood pressure today 162/80. Patient encourage to self monitor blood pressures at home. Daughter [name] is a Marine scientist. If SBP consistently >  140, may consider increasing current Amlodipine dose from 22m to 134mdaily.  Questions and concerns were addressed. Provided general support and encouragement. Patient and family was encouraged to call with questions and/or concerns. My business card was provided.  Follow up  Palliative Care Visit: Palliative care will continue to follow for complex medical decision making, advance care planning, and clarification of goals. Return in about 6 weeks or prn.  PPS: 60%  HOSPICE ELIGIBILITY/DIAGNOSIS: TBD  Chief Complaint: generalized weakness  History obtained from review of Epic EMR and discussion with Melanie Walker and her daughter TeKarna Christmas HISTORY OF PRESENT ILLNESS:  BeWANITA DERENZOs a [age].o. year old female with COPD with Emphysema, HTN, bronchogenic cancer of right lung not currently on treatment. Patient is s/p hospitalization from 06/20/2020 to 07/01/2020 for acute respiratory failure due to COVID-19 infection. Patient discharged to rehab facility for rehabilitation and nursing services. She was discharged on supplemental oxygen.  Patient with compliant of generalized weakness that is ongoing since hospital discharge. She currently receives PT/OT in home, ambulate with a walker, no report of recent falls. No report of fever or chills.  Reviewed CT chest from 06/20/2020 CT Angio completed 06/12/2020 negative for PE Reviewed lab report from 06/25/2020 Na 136, K 3.6, Albumin 2.4, Cr 0.52, GFR >60, WBC 7.9, Hgb 13.4, HCT 41   I reviewed available labs, medications, imaging, studies and related documents from the EMR.  Records reviewed and summarized above.   ROS General: NAD EYES: denies acute vision changes ENMT: denies dysphagia Cardiovascular: denies chest pain, denies DOE Pulmonary: denies cough, denies increased SOB Abdomen: endorses good appetite, denies constipation, endorses continence of bowel GU: denies dysuria, endorses continence of urine MSK: endorsed weakness, no falls reported Skin: denies rashes or wounds Neurological: denies pain, denies insomnia Psych: Endorses positive mood Heme/lymph/immuno: denies bruises, abnormal bleeding  Physical Exam: Current and past weights: 97lb, BMI 19.59kg/m2 Constitutional: NAD General: frail appearing, thin,  lying in bed in NAD  EYES: anicteric sclera, no discharge  ENMT: intact hearing, oral mucous membranes moist CV: RRR, no LE edema Pulmonary: LCTA, no increased work of breathing, no cough, room air Abdomen: soft and non tender, no ascites GU: deferred MSK: sarcopenia, moves all extremities, ambulatory Skin: warm and dry, no rashes or wounds on visible skin Neuro: generalized weakness, no cognitive impairment Psych: non-anxious affect, A and O x 3 Hem/lymph/immuno: no widespread bruising  CURRENT PROBLEM LIST:  Patient Active Problem List   Diagnosis Date Noted  . Acute urinary retention 06/21/2020  . Acute metabolic encephalopathy 0391/63/8466. 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[facility]03/18/2022  . COPD with emphysema ([facility]03/18/2022  . Acute respiratory failure due to COVID-19 ([facility]03/17/2022  . Mild traumatic brain injury (HC[facility]ett[facility]12/24/2019  . Lumbar radiculopathy 09/30/2017  . Paresthesia 02/12/2016  . Abnormality of gait 03/12/2015  . Risk for falls 01/25/2015  . Essential hypertension 01/25/2015  . Seizure ([facility]  . Complex partial seizure ([facility]12/06/2012  . Numbness 03/08/2013  . Low back pain 03/08/2013   PAST MEDICAL HISTORY:  Active Ambulatory Problems    Diagnosis Date Noted  . Complex partial seizure ([facility] [place]12/06/2012  . Numbness 03/08/2013  . Low back pain 03/08/2013  . Seizure ([facility]  . Risk for falls 01/25/2015  . Essential hypertension 01/25/2015  . Abnormality of gait 03/12/2015  . Paresthesia 02/12/2016  . Lumbar radiculopathy 09/30/2017  . Mild traumatic brain injury ([facility]a [place]12/24/2019  . Acute respiratory failure due to  COVID-19 ([place]) 06/20/2020  . Acute urinary retention 06/21/2020  . Acute metabolic encephalopathy 24/19/9144  . 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[place]) 06/21/2020  . COPD with emphysema ([place]) 06/21/2020   Resolved Ambulatory Problems    Diagnosis Date Noted  . No Resolved Ambulatory Problems   Past Medical History:  Diagnosis  Date  . Cancer [facility])    SOCIAL HX:  Social History   Tobacco Use  . Smoking status: Never Smoker  . Smokeless tobacco: Never Used  Substance Use Topics  . Alcohol use: No    Alcohol/week: 0.0 standard drinks   FAMILY HX:  Family History  Problem Relation Age of Onset  . Heart Problems Mother   . Heart Problems Father      ALLERGIES:  Allergies  Allergen Reactions  . Biaxin [Clarithromycin]   . Boniva [Ibandronic Acid] Other (See Comments)    Upset stomach  . Ceftin [Cefuroxime Axetil]   . Dilantin [Phenytoin Sodium Extended]   . Fosamax [Alendronate Sodium] Other (See Comments)    Upset stomach  . Guaifenesin & Derivatives   . Imitrex [Sumatriptan]   . Keflex [Cephalexin]   . Macrobid [Nitrofurantoin Macrocrystal] Hives and Itching  . Montelukast   . Other     Flu shot made rash on arm and caused itching to arm  . Penicillins   . Premarin [Conjugated Estrogens] Itching  . Sulfa Antibiotics   . Hctz [Hydrochlorothiazide] Rash  . Latex Rash     PERTINENT MEDICATIONS:  Outpatient Encounter Medications as of 09/10/2020  Medication Sig  . amLODipine (NORVASC) 5 MG tablet Take 5 mg by mouth daily.  . cetirizine (ZYRTEC ALLERGY) 10 MG tablet Take 1 tablet (10 mg total) by mouth daily.  . melatonin 10 MG TABS Take 10 mg by mouth at bedtime as needed.  . Multiple Vitamins-Minerals (SENTRY SENIOR PO) Take 1 tablet by mouth daily.   No facility-administered encounter medications on file as of 09/10/2020.   Thank you for the opportunity to participate in the care of Melanie Walker.  The palliative care team will continue to follow. Please call our office at [phone] if we can be of additional assistance.   Jari Favre, DNP, AGPCNP-BC  COVID-19 PATIENT SCREENING TOOL Asked and negative response unless otherwise noted:   Have you had symptoms of covid, tested positive or been in contact with someone with symptoms/positive test in the past 5-10 days?

## 2020-09-11 DIAGNOSIS — J9601 Acute respiratory failure with hypoxia: Secondary | ICD-10-CM | POA: Diagnosis not present

## 2020-09-11 DIAGNOSIS — C3491 Malignant neoplasm of unspecified part of right bronchus or lung: Secondary | ICD-10-CM | POA: Diagnosis not present

## 2020-09-11 DIAGNOSIS — I10 Essential (primary) hypertension: Secondary | ICD-10-CM | POA: Diagnosis not present

## 2020-09-11 DIAGNOSIS — G629 Polyneuropathy, unspecified: Secondary | ICD-10-CM | POA: Diagnosis not present

## 2020-09-11 DIAGNOSIS — J439 Emphysema, unspecified: Secondary | ICD-10-CM | POA: Diagnosis not present

## 2020-09-11 DIAGNOSIS — I872 Venous insufficiency (chronic) (peripheral): Secondary | ICD-10-CM | POA: Diagnosis not present

## 2020-09-11 DIAGNOSIS — G40209 Localization-related (focal) (partial) symptomatic epilepsy and epileptic syndromes with complex partial seizures, not intractable, without status epilepticus: Secondary | ICD-10-CM | POA: Diagnosis not present

## 2020-09-11 DIAGNOSIS — J8410 Pulmonary fibrosis, unspecified: Secondary | ICD-10-CM | POA: Diagnosis not present

## 2020-09-11 DIAGNOSIS — G9341 Metabolic encephalopathy: Secondary | ICD-10-CM | POA: Diagnosis not present

## 2020-09-16 DIAGNOSIS — J439 Emphysema, unspecified: Secondary | ICD-10-CM | POA: Diagnosis not present

## 2020-09-16 DIAGNOSIS — J8410 Pulmonary fibrosis, unspecified: Secondary | ICD-10-CM | POA: Diagnosis not present

## 2020-09-16 DIAGNOSIS — I872 Venous insufficiency (chronic) (peripheral): Secondary | ICD-10-CM | POA: Diagnosis not present

## 2020-09-16 DIAGNOSIS — C3491 Malignant neoplasm of unspecified part of right bronchus or lung: Secondary | ICD-10-CM | POA: Diagnosis not present

## 2020-09-16 DIAGNOSIS — G629 Polyneuropathy, unspecified: Secondary | ICD-10-CM | POA: Diagnosis not present

## 2020-09-16 DIAGNOSIS — J9601 Acute respiratory failure with hypoxia: Secondary | ICD-10-CM | POA: Diagnosis not present

## 2020-09-16 DIAGNOSIS — I10 Essential (primary) hypertension: Secondary | ICD-10-CM | POA: Diagnosis not present

## 2020-09-16 DIAGNOSIS — G40209 Localization-related (focal) (partial) symptomatic epilepsy and epileptic syndromes with complex partial seizures, not intractable, without status epilepticus: Secondary | ICD-10-CM | POA: Diagnosis not present

## 2020-09-16 DIAGNOSIS — G9341 Metabolic encephalopathy: Secondary | ICD-10-CM | POA: Diagnosis not present

## 2020-09-18 DIAGNOSIS — J9601 Acute respiratory failure with hypoxia: Secondary | ICD-10-CM | POA: Diagnosis not present

## 2020-09-18 DIAGNOSIS — G9341 Metabolic encephalopathy: Secondary | ICD-10-CM | POA: Diagnosis not present

## 2020-09-18 DIAGNOSIS — I872 Venous insufficiency (chronic) (peripheral): Secondary | ICD-10-CM | POA: Diagnosis not present

## 2020-09-18 DIAGNOSIS — G40209 Localization-related (focal) (partial) symptomatic epilepsy and epileptic syndromes with complex partial seizures, not intractable, without status epilepticus: Secondary | ICD-10-CM | POA: Diagnosis not present

## 2020-09-18 DIAGNOSIS — J439 Emphysema, unspecified: Secondary | ICD-10-CM | POA: Diagnosis not present

## 2020-09-18 DIAGNOSIS — G629 Polyneuropathy, unspecified: Secondary | ICD-10-CM | POA: Diagnosis not present

## 2020-09-18 DIAGNOSIS — I10 Essential (primary) hypertension: Secondary | ICD-10-CM | POA: Diagnosis not present

## 2020-09-18 DIAGNOSIS — C3491 Malignant neoplasm of unspecified part of right bronchus or lung: Secondary | ICD-10-CM | POA: Diagnosis not present

## 2020-09-18 DIAGNOSIS — J8410 Pulmonary fibrosis, unspecified: Secondary | ICD-10-CM | POA: Diagnosis not present

## 2020-09-19 DIAGNOSIS — G629 Polyneuropathy, unspecified: Secondary | ICD-10-CM | POA: Diagnosis not present

## 2020-09-19 DIAGNOSIS — G40209 Localization-related (focal) (partial) symptomatic epilepsy and epileptic syndromes with complex partial seizures, not intractable, without status epilepticus: Secondary | ICD-10-CM | POA: Diagnosis not present

## 2020-09-19 DIAGNOSIS — J9601 Acute respiratory failure with hypoxia: Secondary | ICD-10-CM | POA: Diagnosis not present

## 2020-09-19 DIAGNOSIS — C3491 Malignant neoplasm of unspecified part of right bronchus or lung: Secondary | ICD-10-CM | POA: Diagnosis not present

## 2020-09-19 DIAGNOSIS — G9341 Metabolic encephalopathy: Secondary | ICD-10-CM | POA: Diagnosis not present

## 2020-09-19 DIAGNOSIS — J439 Emphysema, unspecified: Secondary | ICD-10-CM | POA: Diagnosis not present

## 2020-09-19 DIAGNOSIS — I10 Essential (primary) hypertension: Secondary | ICD-10-CM | POA: Diagnosis not present

## 2020-09-19 DIAGNOSIS — I872 Venous insufficiency (chronic) (peripheral): Secondary | ICD-10-CM | POA: Diagnosis not present

## 2020-09-19 DIAGNOSIS — J8410 Pulmonary fibrosis, unspecified: Secondary | ICD-10-CM | POA: Diagnosis not present

## 2020-09-21 DIAGNOSIS — J8410 Pulmonary fibrosis, unspecified: Secondary | ICD-10-CM | POA: Diagnosis not present

## 2020-09-21 DIAGNOSIS — J9601 Acute respiratory failure with hypoxia: Secondary | ICD-10-CM | POA: Diagnosis not present

## 2020-09-21 DIAGNOSIS — G40209 Localization-related (focal) (partial) symptomatic epilepsy and epileptic syndromes with complex partial seizures, not intractable, without status epilepticus: Secondary | ICD-10-CM | POA: Diagnosis not present

## 2020-09-21 DIAGNOSIS — I872 Venous insufficiency (chronic) (peripheral): Secondary | ICD-10-CM | POA: Diagnosis not present

## 2020-09-21 DIAGNOSIS — G9341 Metabolic encephalopathy: Secondary | ICD-10-CM | POA: Diagnosis not present

## 2020-09-21 DIAGNOSIS — J439 Emphysema, unspecified: Secondary | ICD-10-CM | POA: Diagnosis not present

## 2020-09-21 DIAGNOSIS — I10 Essential (primary) hypertension: Secondary | ICD-10-CM | POA: Diagnosis not present

## 2020-09-21 DIAGNOSIS — C3491 Malignant neoplasm of unspecified part of right bronchus or lung: Secondary | ICD-10-CM | POA: Diagnosis not present

## 2020-09-21 DIAGNOSIS — G629 Polyneuropathy, unspecified: Secondary | ICD-10-CM | POA: Diagnosis not present

## 2020-09-23 DIAGNOSIS — I872 Venous insufficiency (chronic) (peripheral): Secondary | ICD-10-CM | POA: Diagnosis not present

## 2020-09-23 DIAGNOSIS — J8410 Pulmonary fibrosis, unspecified: Secondary | ICD-10-CM | POA: Diagnosis not present

## 2020-09-23 DIAGNOSIS — G40209 Localization-related (focal) (partial) symptomatic epilepsy and epileptic syndromes with complex partial seizures, not intractable, without status epilepticus: Secondary | ICD-10-CM | POA: Diagnosis not present

## 2020-09-23 DIAGNOSIS — J439 Emphysema, unspecified: Secondary | ICD-10-CM | POA: Diagnosis not present

## 2020-09-23 DIAGNOSIS — J9601 Acute respiratory failure with hypoxia: Secondary | ICD-10-CM | POA: Diagnosis not present

## 2020-09-23 DIAGNOSIS — G9341 Metabolic encephalopathy: Secondary | ICD-10-CM | POA: Diagnosis not present

## 2020-09-23 DIAGNOSIS — C3491 Malignant neoplasm of unspecified part of right bronchus or lung: Secondary | ICD-10-CM | POA: Diagnosis not present

## 2020-09-23 DIAGNOSIS — G629 Polyneuropathy, unspecified: Secondary | ICD-10-CM | POA: Diagnosis not present

## 2020-09-23 DIAGNOSIS — I10 Essential (primary) hypertension: Secondary | ICD-10-CM | POA: Diagnosis not present

## 2020-09-25 DIAGNOSIS — G629 Polyneuropathy, unspecified: Secondary | ICD-10-CM | POA: Diagnosis not present

## 2020-09-25 DIAGNOSIS — J8410 Pulmonary fibrosis, unspecified: Secondary | ICD-10-CM | POA: Diagnosis not present

## 2020-09-25 DIAGNOSIS — C3491 Malignant neoplasm of unspecified part of right bronchus or lung: Secondary | ICD-10-CM | POA: Diagnosis not present

## 2020-09-25 DIAGNOSIS — I10 Essential (primary) hypertension: Secondary | ICD-10-CM | POA: Diagnosis not present

## 2020-09-25 DIAGNOSIS — J439 Emphysema, unspecified: Secondary | ICD-10-CM | POA: Diagnosis not present

## 2020-09-25 DIAGNOSIS — I872 Venous insufficiency (chronic) (peripheral): Secondary | ICD-10-CM | POA: Diagnosis not present

## 2020-09-25 DIAGNOSIS — G9341 Metabolic encephalopathy: Secondary | ICD-10-CM | POA: Diagnosis not present

## 2020-09-25 DIAGNOSIS — G40209 Localization-related (focal) (partial) symptomatic epilepsy and epileptic syndromes with complex partial seizures, not intractable, without status epilepticus: Secondary | ICD-10-CM | POA: Diagnosis not present

## 2020-09-25 DIAGNOSIS — J9601 Acute respiratory failure with hypoxia: Secondary | ICD-10-CM | POA: Diagnosis not present

## 2020-10-09 DIAGNOSIS — J9600 Acute respiratory failure, unspecified whether with hypoxia or hypercapnia: Secondary | ICD-10-CM | POA: Diagnosis not present

## 2020-10-09 DIAGNOSIS — J449 Chronic obstructive pulmonary disease, unspecified: Secondary | ICD-10-CM | POA: Diagnosis not present

## 2020-10-11 DIAGNOSIS — J439 Emphysema, unspecified: Secondary | ICD-10-CM | POA: Diagnosis not present

## 2020-10-11 DIAGNOSIS — J8410 Pulmonary fibrosis, unspecified: Secondary | ICD-10-CM | POA: Diagnosis not present

## 2020-10-11 DIAGNOSIS — G9341 Metabolic encephalopathy: Secondary | ICD-10-CM | POA: Diagnosis not present

## 2020-10-11 DIAGNOSIS — C3491 Malignant neoplasm of unspecified part of right bronchus or lung: Secondary | ICD-10-CM | POA: Diagnosis not present

## 2020-10-11 DIAGNOSIS — G629 Polyneuropathy, unspecified: Secondary | ICD-10-CM | POA: Diagnosis not present

## 2020-10-11 DIAGNOSIS — G40209 Localization-related (focal) (partial) symptomatic epilepsy and epileptic syndromes with complex partial seizures, not intractable, without status epilepticus: Secondary | ICD-10-CM | POA: Diagnosis not present

## 2020-10-11 DIAGNOSIS — J9601 Acute respiratory failure with hypoxia: Secondary | ICD-10-CM | POA: Diagnosis not present

## 2020-10-11 DIAGNOSIS — I872 Venous insufficiency (chronic) (peripheral): Secondary | ICD-10-CM | POA: Diagnosis not present

## 2020-10-11 DIAGNOSIS — I10 Essential (primary) hypertension: Secondary | ICD-10-CM | POA: Diagnosis not present

## 2020-10-16 DIAGNOSIS — J9601 Acute respiratory failure with hypoxia: Secondary | ICD-10-CM | POA: Diagnosis not present

## 2020-10-16 DIAGNOSIS — G9341 Metabolic encephalopathy: Secondary | ICD-10-CM | POA: Diagnosis not present

## 2020-10-16 DIAGNOSIS — J439 Emphysema, unspecified: Secondary | ICD-10-CM | POA: Diagnosis not present

## 2020-10-16 DIAGNOSIS — G40209 Localization-related (focal) (partial) symptomatic epilepsy and epileptic syndromes with complex partial seizures, not intractable, without status epilepticus: Secondary | ICD-10-CM | POA: Diagnosis not present

## 2020-10-16 DIAGNOSIS — G629 Polyneuropathy, unspecified: Secondary | ICD-10-CM | POA: Diagnosis not present

## 2020-10-16 DIAGNOSIS — I872 Venous insufficiency (chronic) (peripheral): Secondary | ICD-10-CM | POA: Diagnosis not present

## 2020-10-16 DIAGNOSIS — J8410 Pulmonary fibrosis, unspecified: Secondary | ICD-10-CM | POA: Diagnosis not present

## 2020-10-16 DIAGNOSIS — C3491 Malignant neoplasm of unspecified part of right bronchus or lung: Secondary | ICD-10-CM | POA: Diagnosis not present

## 2020-10-16 DIAGNOSIS — I10 Essential (primary) hypertension: Secondary | ICD-10-CM | POA: Diagnosis not present

## 2020-10-18 DIAGNOSIS — G629 Polyneuropathy, unspecified: Secondary | ICD-10-CM | POA: Diagnosis not present

## 2020-10-18 DIAGNOSIS — C3491 Malignant neoplasm of unspecified part of right bronchus or lung: Secondary | ICD-10-CM | POA: Diagnosis not present

## 2020-10-18 DIAGNOSIS — G40209 Localization-related (focal) (partial) symptomatic epilepsy and epileptic syndromes with complex partial seizures, not intractable, without status epilepticus: Secondary | ICD-10-CM | POA: Diagnosis not present

## 2020-10-18 DIAGNOSIS — J439 Emphysema, unspecified: Secondary | ICD-10-CM | POA: Diagnosis not present

## 2020-10-18 DIAGNOSIS — J8410 Pulmonary fibrosis, unspecified: Secondary | ICD-10-CM | POA: Diagnosis not present

## 2020-10-18 DIAGNOSIS — G9341 Metabolic encephalopathy: Secondary | ICD-10-CM | POA: Diagnosis not present

## 2020-10-18 DIAGNOSIS — I10 Essential (primary) hypertension: Secondary | ICD-10-CM | POA: Diagnosis not present

## 2020-10-18 DIAGNOSIS — J9601 Acute respiratory failure with hypoxia: Secondary | ICD-10-CM | POA: Diagnosis not present

## 2020-10-18 DIAGNOSIS — I872 Venous insufficiency (chronic) (peripheral): Secondary | ICD-10-CM | POA: Diagnosis not present

## 2020-10-21 DIAGNOSIS — I10 Essential (primary) hypertension: Secondary | ICD-10-CM | POA: Diagnosis not present

## 2020-10-21 DIAGNOSIS — N952 Postmenopausal atrophic vaginitis: Secondary | ICD-10-CM | POA: Diagnosis not present

## 2020-10-21 DIAGNOSIS — G629 Polyneuropathy, unspecified: Secondary | ICD-10-CM | POA: Diagnosis not present

## 2020-10-21 DIAGNOSIS — M810 Age-related osteoporosis without current pathological fracture: Secondary | ICD-10-CM | POA: Diagnosis not present

## 2020-10-21 DIAGNOSIS — J849 Interstitial pulmonary disease, unspecified: Secondary | ICD-10-CM | POA: Diagnosis not present

## 2020-10-21 DIAGNOSIS — G40209 Localization-related (focal) (partial) symptomatic epilepsy and epileptic syndromes with complex partial seizures, not intractable, without status epilepticus: Secondary | ICD-10-CM | POA: Diagnosis not present

## 2020-10-21 DIAGNOSIS — J439 Emphysema, unspecified: Secondary | ICD-10-CM | POA: Diagnosis not present

## 2020-10-21 DIAGNOSIS — N390 Urinary tract infection, site not specified: Secondary | ICD-10-CM | POA: Diagnosis not present

## 2020-10-21 DIAGNOSIS — I700 Atherosclerosis of aorta: Secondary | ICD-10-CM | POA: Diagnosis not present

## 2020-10-24 DIAGNOSIS — G629 Polyneuropathy, unspecified: Secondary | ICD-10-CM | POA: Diagnosis not present

## 2020-10-24 DIAGNOSIS — I10 Essential (primary) hypertension: Secondary | ICD-10-CM | POA: Diagnosis not present

## 2020-10-24 DIAGNOSIS — J849 Interstitial pulmonary disease, unspecified: Secondary | ICD-10-CM | POA: Diagnosis not present

## 2020-10-24 DIAGNOSIS — J439 Emphysema, unspecified: Secondary | ICD-10-CM | POA: Diagnosis not present

## 2020-10-24 DIAGNOSIS — I700 Atherosclerosis of aorta: Secondary | ICD-10-CM | POA: Diagnosis not present

## 2020-10-24 DIAGNOSIS — G40209 Localization-related (focal) (partial) symptomatic epilepsy and epileptic syndromes with complex partial seizures, not intractable, without status epilepticus: Secondary | ICD-10-CM | POA: Diagnosis not present

## 2020-10-24 DIAGNOSIS — N390 Urinary tract infection, site not specified: Secondary | ICD-10-CM | POA: Diagnosis not present

## 2020-10-24 DIAGNOSIS — M810 Age-related osteoporosis without current pathological fracture: Secondary | ICD-10-CM | POA: Diagnosis not present

## 2020-10-24 DIAGNOSIS — N952 Postmenopausal atrophic vaginitis: Secondary | ICD-10-CM | POA: Diagnosis not present

## 2020-10-29 DIAGNOSIS — I700 Atherosclerosis of aorta: Secondary | ICD-10-CM | POA: Diagnosis not present

## 2020-10-29 DIAGNOSIS — I10 Essential (primary) hypertension: Secondary | ICD-10-CM | POA: Diagnosis not present

## 2020-10-29 DIAGNOSIS — G629 Polyneuropathy, unspecified: Secondary | ICD-10-CM | POA: Diagnosis not present

## 2020-10-29 DIAGNOSIS — N952 Postmenopausal atrophic vaginitis: Secondary | ICD-10-CM | POA: Diagnosis not present

## 2020-10-29 DIAGNOSIS — G40209 Localization-related (focal) (partial) symptomatic epilepsy and epileptic syndromes with complex partial seizures, not intractable, without status epilepticus: Secondary | ICD-10-CM | POA: Diagnosis not present

## 2020-10-29 DIAGNOSIS — J849 Interstitial pulmonary disease, unspecified: Secondary | ICD-10-CM | POA: Diagnosis not present

## 2020-10-29 DIAGNOSIS — J439 Emphysema, unspecified: Secondary | ICD-10-CM | POA: Diagnosis not present

## 2020-10-29 DIAGNOSIS — M810 Age-related osteoporosis without current pathological fracture: Secondary | ICD-10-CM | POA: Diagnosis not present

## 2020-10-29 DIAGNOSIS — N390 Urinary tract infection, site not specified: Secondary | ICD-10-CM | POA: Diagnosis not present

## 2020-10-30 ENCOUNTER — Telehealth: Payer: Self-pay | Admitting: *Deleted

## 2020-10-30 NOTE — Telephone Encounter (Signed)
I received a return call from [place] for Dr. Felipa Eth who advised that MD is agreeable to obtaining a urinalysis and urine culture per daughter's request. She will put the order in for Monday 11/04/20 and states I can drop the specimen off at their lab.  Made daughter aware of this and she is appreciative.

## 2020-10-30 NOTE — Telephone Encounter (Signed)
Called and left a message with patient's daughter, [name], to confirm palliative care appointment scheduled for today at 2p. Terri returned my call and states that she needs to re-schedule this visit as she has family coming in from out of town today. Visit re-scheduled for 11/04/20 at 1:30p. Terri also asked if palliative care could obtain a urine specimen as patient is c/o dysuria. Palliative care is able to obtain specimens for those with a homebound status. Patient meets criteria as she has not been out of her home since her hospitalization. She is currently working with PT, but has not been shown how to enter/exit a car safely. Advised daughter I would obtain an order from her PCP, Dr. Felipa Eth.  I called and left a voicemail with PCP requesting order for a U/A C&S and where to drop specimen off (his office vs [facility]) if he agrees to give the order. Awaiting return call.

## 2020-11-04 ENCOUNTER — Other Ambulatory Visit: Payer: Self-pay

## 2020-11-04 ENCOUNTER — Other Ambulatory Visit: Payer: Medicare HMO | Admitting: *Deleted

## 2020-11-05 DIAGNOSIS — J849 Interstitial pulmonary disease, unspecified: Secondary | ICD-10-CM | POA: Diagnosis not present

## 2020-11-05 DIAGNOSIS — M810 Age-related osteoporosis without current pathological fracture: Secondary | ICD-10-CM | POA: Diagnosis not present

## 2020-11-05 DIAGNOSIS — G629 Polyneuropathy, unspecified: Secondary | ICD-10-CM | POA: Diagnosis not present

## 2020-11-05 DIAGNOSIS — J439 Emphysema, unspecified: Secondary | ICD-10-CM | POA: Diagnosis not present

## 2020-11-05 DIAGNOSIS — I700 Atherosclerosis of aorta: Secondary | ICD-10-CM | POA: Diagnosis not present

## 2020-11-05 DIAGNOSIS — N390 Urinary tract infection, site not specified: Secondary | ICD-10-CM | POA: Diagnosis not present

## 2020-11-05 DIAGNOSIS — I10 Essential (primary) hypertension: Secondary | ICD-10-CM | POA: Diagnosis not present

## 2020-11-05 DIAGNOSIS — N952 Postmenopausal atrophic vaginitis: Secondary | ICD-10-CM | POA: Diagnosis not present

## 2020-11-05 DIAGNOSIS — G40209 Localization-related (focal) (partial) symptomatic epilepsy and epileptic syndromes with complex partial seizures, not intractable, without status epilepticus: Secondary | ICD-10-CM | POA: Diagnosis not present

## 2020-11-08 DIAGNOSIS — N952 Postmenopausal atrophic vaginitis: Secondary | ICD-10-CM | POA: Diagnosis not present

## 2020-11-08 DIAGNOSIS — M810 Age-related osteoporosis without current pathological fracture: Secondary | ICD-10-CM | POA: Diagnosis not present

## 2020-11-08 DIAGNOSIS — I700 Atherosclerosis of aorta: Secondary | ICD-10-CM | POA: Diagnosis not present

## 2020-11-08 DIAGNOSIS — G629 Polyneuropathy, unspecified: Secondary | ICD-10-CM | POA: Diagnosis not present

## 2020-11-08 DIAGNOSIS — J849 Interstitial pulmonary disease, unspecified: Secondary | ICD-10-CM | POA: Diagnosis not present

## 2020-11-08 DIAGNOSIS — J439 Emphysema, unspecified: Secondary | ICD-10-CM | POA: Diagnosis not present

## 2020-11-08 DIAGNOSIS — G40209 Localization-related (focal) (partial) symptomatic epilepsy and epileptic syndromes with complex partial seizures, not intractable, without status epilepticus: Secondary | ICD-10-CM | POA: Diagnosis not present

## 2020-11-08 DIAGNOSIS — I10 Essential (primary) hypertension: Secondary | ICD-10-CM | POA: Diagnosis not present

## 2020-11-08 DIAGNOSIS — N390 Urinary tract infection, site not specified: Secondary | ICD-10-CM | POA: Diagnosis not present

## 2020-11-09 DIAGNOSIS — J9600 Acute respiratory failure, unspecified whether with hypoxia or hypercapnia: Secondary | ICD-10-CM | POA: Diagnosis not present

## 2020-11-09 DIAGNOSIS — J449 Chronic obstructive pulmonary disease, unspecified: Secondary | ICD-10-CM | POA: Diagnosis not present

## 2020-11-12 DIAGNOSIS — J439 Emphysema, unspecified: Secondary | ICD-10-CM | POA: Diagnosis not present

## 2020-11-12 DIAGNOSIS — M810 Age-related osteoporosis without current pathological fracture: Secondary | ICD-10-CM | POA: Diagnosis not present

## 2020-11-12 DIAGNOSIS — N952 Postmenopausal atrophic vaginitis: Secondary | ICD-10-CM | POA: Diagnosis not present

## 2020-11-12 DIAGNOSIS — G629 Polyneuropathy, unspecified: Secondary | ICD-10-CM | POA: Diagnosis not present

## 2020-11-12 DIAGNOSIS — I700 Atherosclerosis of aorta: Secondary | ICD-10-CM | POA: Diagnosis not present

## 2020-11-12 DIAGNOSIS — N390 Urinary tract infection, site not specified: Secondary | ICD-10-CM | POA: Diagnosis not present

## 2020-11-12 DIAGNOSIS — I10 Essential (primary) hypertension: Secondary | ICD-10-CM | POA: Diagnosis not present

## 2020-11-12 DIAGNOSIS — J849 Interstitial pulmonary disease, unspecified: Secondary | ICD-10-CM | POA: Diagnosis not present

## 2020-11-12 DIAGNOSIS — G40209 Localization-related (focal) (partial) symptomatic epilepsy and epileptic syndromes with complex partial seizures, not intractable, without status epilepticus: Secondary | ICD-10-CM | POA: Diagnosis not present

## 2020-11-13 ENCOUNTER — Other Ambulatory Visit: Payer: Self-pay

## 2020-11-13 ENCOUNTER — Other Ambulatory Visit: Payer: Medicare HMO | Admitting: *Deleted

## 2020-11-13 VITALS — BP 142/80 | HR 97 | Temp 97.9°F | Resp 20

## 2020-11-13 DIAGNOSIS — J849 Interstitial pulmonary disease, unspecified: Secondary | ICD-10-CM | POA: Diagnosis not present

## 2020-11-13 DIAGNOSIS — J439 Emphysema, unspecified: Secondary | ICD-10-CM | POA: Diagnosis not present

## 2020-11-13 DIAGNOSIS — I10 Essential (primary) hypertension: Secondary | ICD-10-CM | POA: Diagnosis not present

## 2020-11-13 DIAGNOSIS — G40209 Localization-related (focal) (partial) symptomatic epilepsy and epileptic syndromes with complex partial seizures, not intractable, without status epilepticus: Secondary | ICD-10-CM | POA: Diagnosis not present

## 2020-11-13 DIAGNOSIS — M810 Age-related osteoporosis without current pathological fracture: Secondary | ICD-10-CM | POA: Diagnosis not present

## 2020-11-13 DIAGNOSIS — I700 Atherosclerosis of aorta: Secondary | ICD-10-CM | POA: Diagnosis not present

## 2020-11-13 DIAGNOSIS — R300 Dysuria: Secondary | ICD-10-CM | POA: Diagnosis not present

## 2020-11-13 DIAGNOSIS — N390 Urinary tract infection, site not specified: Secondary | ICD-10-CM | POA: Diagnosis not present

## 2020-11-13 DIAGNOSIS — Z515 Encounter for palliative care: Secondary | ICD-10-CM

## 2020-11-13 DIAGNOSIS — G629 Polyneuropathy, unspecified: Secondary | ICD-10-CM | POA: Diagnosis not present

## 2020-11-13 DIAGNOSIS — N952 Postmenopausal atrophic vaginitis: Secondary | ICD-10-CM | POA: Diagnosis not present

## 2020-11-18 NOTE — Progress Notes (Signed)
[facility] RN NOTE  PATIENT NAME: Melanie Walker DOB: 03/12/1931 MRN: [id number]  PRIMARY CARE PROVIDER: Lajean Manes, MD  RESPONSIBLE PARTY: Marcial Pacas (daughter) Acct ID - Guarantor Home Phone Work Phone Relationship Acct Type  [id number] Thomasene [phone]  Self P/F     [address], [place], [place] [address]   Covid-19 Pre-screening Negative  PLAN OF CARE and INTERVENTION:  ADVANCE CARE PLANNING/GOALS OF CARE; Goal is for patient to remain in her home and get stronger overall.  PATIENT/CAREGIVER EDUCATION: Symptom management, safe mobility, importance of oxygen use, s/s of infection DISEASE STATUS: Palliative care visit completed with patient and daughter in patient's home. Upon arrival, patient is lying in bed awake and alert. There was a previous visit scheduled for 10/30/20 and daughter requested urinalysis and culture due to dysuria. Daughter cancelled this visit with palliative care stating that patient was feeling better. However, over the past week patient has had a progression of weakness. She did have a PT session yesterday, but was not able to do as much as in previous sessions due to weakness and not feeling well. Daughter says PT had patient place her oxygen on stating that this may make her feel better and give her a boost. Her saturations were in the 90s yesterday when working with therapy. Today her oxygen at rest is 86-87% on room air. Patient says she has not required oxygen in the past several weeks. Advised that oxygen is required when levels are 88% and below. Patient denies shortness of breath, but was noted after she transferred to the bedside commode and back in bed. She has an occasional non-productive cough. Lungs are clear. Daughter requested that I still obtain a urine specimen. I called and spoke with Dr. Carlyle Lipa CMA who put an order back in the computer for this in order for me to drop it off at their lab. Daughter is pushing  patient to drink fluids. Her appetite is variable. She ate a banana this morning, but remained in her bed when she usually goes into the kitchen to eat. She is receiving PT/OT still 1-2x/week. I took the specimen to the lab as requested.   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 Palliative care team continues to follow patient for symptom management, goals of care and complex decision making.   CODE STATUS: Full code ADVANCED DIRECTIVES: Y MOST FORM: no PPS: 50%   PHYSICAL EXAM:   VITALS: Today's Vitals   11/13/20 1135  BP: (!) 142/80  Pulse: 97  Resp: 20  Temp: 97.9 F (36.6 C)  TempSrc: Temporal  SpO2: (!) 86%  PainSc: 0-No pain    LUNGS: clear to auscultation  CARDIAC: Cor RRR EXTREMITIES: No edema SKIN:  Exposed skin is dry and intact; no open areas   NEURO:  Alert and oriented x 3, increased generalized weakness, ambulatory w/walker   (Duration of visit and documentation 45 minutes)   Daryl Eastern, RN BSN

## 2020-11-20 DIAGNOSIS — I700 Atherosclerosis of aorta: Secondary | ICD-10-CM | POA: Diagnosis not present

## 2020-11-20 DIAGNOSIS — I10 Essential (primary) hypertension: Secondary | ICD-10-CM | POA: Diagnosis not present

## 2020-11-20 DIAGNOSIS — J849 Interstitial pulmonary disease, unspecified: Secondary | ICD-10-CM | POA: Diagnosis not present

## 2020-11-20 DIAGNOSIS — G40209 Localization-related (focal) (partial) symptomatic epilepsy and epileptic syndromes with complex partial seizures, not intractable, without status epilepticus: Secondary | ICD-10-CM | POA: Diagnosis not present

## 2020-11-20 DIAGNOSIS — J439 Emphysema, unspecified: Secondary | ICD-10-CM | POA: Diagnosis not present

## 2020-11-20 DIAGNOSIS — N952 Postmenopausal atrophic vaginitis: Secondary | ICD-10-CM | POA: Diagnosis not present

## 2020-11-20 DIAGNOSIS — G629 Polyneuropathy, unspecified: Secondary | ICD-10-CM | POA: Diagnosis not present

## 2020-11-20 DIAGNOSIS — N390 Urinary tract infection, site not specified: Secondary | ICD-10-CM | POA: Diagnosis not present

## 2020-11-20 DIAGNOSIS — M810 Age-related osteoporosis without current pathological fracture: Secondary | ICD-10-CM | POA: Diagnosis not present

## 2020-11-22 DIAGNOSIS — G40209 Localization-related (focal) (partial) symptomatic epilepsy and epileptic syndromes with complex partial seizures, not intractable, without status epilepticus: Secondary | ICD-10-CM | POA: Diagnosis not present

## 2020-11-22 DIAGNOSIS — M810 Age-related osteoporosis without current pathological fracture: Secondary | ICD-10-CM | POA: Diagnosis not present

## 2020-11-22 DIAGNOSIS — G629 Polyneuropathy, unspecified: Secondary | ICD-10-CM | POA: Diagnosis not present

## 2020-11-22 DIAGNOSIS — I700 Atherosclerosis of aorta: Secondary | ICD-10-CM | POA: Diagnosis not present

## 2020-11-22 DIAGNOSIS — N952 Postmenopausal atrophic vaginitis: Secondary | ICD-10-CM | POA: Diagnosis not present

## 2020-11-22 DIAGNOSIS — I10 Essential (primary) hypertension: Secondary | ICD-10-CM | POA: Diagnosis not present

## 2020-11-22 DIAGNOSIS — N390 Urinary tract infection, site not specified: Secondary | ICD-10-CM | POA: Diagnosis not present

## 2020-11-22 DIAGNOSIS — J439 Emphysema, unspecified: Secondary | ICD-10-CM | POA: Diagnosis not present

## 2020-11-22 DIAGNOSIS — J849 Interstitial pulmonary disease, unspecified: Secondary | ICD-10-CM | POA: Diagnosis not present

## 2020-12-02 DIAGNOSIS — G629 Polyneuropathy, unspecified: Secondary | ICD-10-CM | POA: Diagnosis not present

## 2020-12-02 DIAGNOSIS — M810 Age-related osteoporosis without current pathological fracture: Secondary | ICD-10-CM | POA: Diagnosis not present

## 2020-12-02 DIAGNOSIS — J439 Emphysema, unspecified: Secondary | ICD-10-CM | POA: Diagnosis not present

## 2020-12-02 DIAGNOSIS — N952 Postmenopausal atrophic vaginitis: Secondary | ICD-10-CM | POA: Diagnosis not present

## 2020-12-02 DIAGNOSIS — I700 Atherosclerosis of aorta: Secondary | ICD-10-CM | POA: Diagnosis not present

## 2020-12-02 DIAGNOSIS — N390 Urinary tract infection, site not specified: Secondary | ICD-10-CM | POA: Diagnosis not present

## 2020-12-02 DIAGNOSIS — J849 Interstitial pulmonary disease, unspecified: Secondary | ICD-10-CM | POA: Diagnosis not present

## 2020-12-02 DIAGNOSIS — I10 Essential (primary) hypertension: Secondary | ICD-10-CM | POA: Diagnosis not present

## 2020-12-02 DIAGNOSIS — G40209 Localization-related (focal) (partial) symptomatic epilepsy and epileptic syndromes with complex partial seizures, not intractable, without status epilepticus: Secondary | ICD-10-CM | POA: Diagnosis not present

## 2020-12-10 DIAGNOSIS — J9600 Acute respiratory failure, unspecified whether with hypoxia or hypercapnia: Secondary | ICD-10-CM | POA: Diagnosis not present

## 2020-12-10 DIAGNOSIS — J449 Chronic obstructive pulmonary disease, unspecified: Secondary | ICD-10-CM | POA: Diagnosis not present

## 2021-01-09 DIAGNOSIS — J449 Chronic obstructive pulmonary disease, unspecified: Secondary | ICD-10-CM | POA: Diagnosis not present

## 2021-01-09 DIAGNOSIS — J9600 Acute respiratory failure, unspecified whether with hypoxia or hypercapnia: Secondary | ICD-10-CM | POA: Diagnosis not present

## 2021-02-09 DIAGNOSIS — J9600 Acute respiratory failure, unspecified whether with hypoxia or hypercapnia: Secondary | ICD-10-CM | POA: Diagnosis not present

## 2021-02-09 DIAGNOSIS — J449 Chronic obstructive pulmonary disease, unspecified: Secondary | ICD-10-CM | POA: Diagnosis not present

## 2021-06-18 ENCOUNTER — Other Ambulatory Visit: Payer: Self-pay

## 2021-06-18 DIAGNOSIS — R911 Solitary pulmonary nodule: Secondary | ICD-10-CM

## 2021-06-27 ENCOUNTER — Ambulatory Visit (HOSPITAL_COMMUNITY): Payer: Medicare HMO

## 2021-08-04 ENCOUNTER — Telehealth: Payer: Self-pay

## 2021-08-04 NOTE — Telephone Encounter (Signed)
424 pm.  Message received from [place] volunteer to return call to patient regarding services.  I contacted [phone]  but number is no longer in service.  Message left with daughter [name] requesting a call back.  ?

## 2021-08-21 ENCOUNTER — Telehealth: Payer: Self-pay

## 2021-08-21 ENCOUNTER — Other Ambulatory Visit: Payer: Medicare HMO | Admitting: *Deleted

## 2021-08-21 DIAGNOSIS — Z515 Encounter for palliative care: Secondary | ICD-10-CM

## 2021-08-21 NOTE — Telephone Encounter (Signed)
16 am.  Message received from daughter [name] requesting a call back due to concerns over a UTI and need for specimen collection.  Return call made.  No answer.  Message left with call back information.

## 2021-08-21 NOTE — Telephone Encounter (Signed)
1217 pm.  Incoming call from daughter [name].  She is requesting to r/o UTI and have u/a specimen.  Patient is remaining in bed but still able to ambulate short distances.  Has a bedside commode she is using.  Appetite is unchanged with 2-3 meals a day.  Daughter would like general assessment with weight if possible tomorrow.  Has a private sitter named Malachy Mood who will be present from 10 am - 5 pm.  Message left with Damaris Hippo, NP regarding clean cath u/a.

## 2021-08-21 NOTE — Telephone Encounter (Signed)
1251 pm.  Incoming call from Damaris Hippo, NP.  Okay to obtain a u/a for patient.  Spoke with Alysia Penna, RN who will schedule appointment and collect specimen.

## 2021-08-21 NOTE — Progress Notes (Signed)
[facility] COMMUNITY PALLIATIVE CARE RN NOTE  PATIENT NAME: Melanie Walker DOB: 1930-04-21 MRN: [id number]  PRIMARY CARE PROVIDER: Lajean Manes, MD  RESPONSIBLE PARTY: Marcial Pacas (daughter) Acct ID - Guarantor Home Phone Work Phone Relationship Acct Type  [id number] Thomasene [phone]  Self P/F     [address], [place], [place] [address]   Due to the COVID-19 crisis, this virtual check-in visit was done via telephone from my office and it was initiated and consent by this patient and or family.  RN telephonic encounter completed with patient's daughter-Terri. Daughter has concerns regarding patient lethargy. She reports that at the beginning of March when she returned from out of town she noticed that patient had a bruised tailbone. Ever since then patient has been laying down a lot. She is less active, tires more easily and mainly just wants to go back to bed after each activity. Her appetite is good, but despite this she is not gaining weight. Daughter wants to rule out a UTI, as patient has a history of recurrent UTIs. She also requests patient assessment and weight check. Received order for u/a and culture from Damaris Hippo NP. RN visit scheduled for tomorrow 08/22/21@130p . Palliative care will continue to follow.    (Duration of visit and documentation 10 minutes)   Daryl Eastern, RN BSN

## 2021-08-22 ENCOUNTER — Other Ambulatory Visit: Payer: Medicare HMO | Admitting: *Deleted

## 2021-08-22 VITALS — BP 112/64 | HR 78 | Temp 97.9°F | Resp 17 | Ht 61.0 in | Wt 84.6 lb

## 2021-08-22 DIAGNOSIS — N390 Urinary tract infection, site not specified: Secondary | ICD-10-CM | POA: Insufficient documentation

## 2021-08-22 DIAGNOSIS — Z515 Encounter for palliative care: Secondary | ICD-10-CM

## 2021-08-25 ENCOUNTER — Telehealth: Payer: Self-pay | Admitting: *Deleted

## 2021-08-25 ENCOUNTER — Encounter: Payer: Self-pay | Admitting: Family Medicine

## 2021-08-25 NOTE — Telephone Encounter (Signed)
Received urinalysis and culture results from [facility] this morning. Patient's palliative NP, Damaris Hippo requested I contact patient's daughter and let her know that patient is positive for UTI and Yeast. Scripts for Cipro twice daily x 3 days and Flucanozole 1 tablet the morning after completing antibiotics then again in 72 hours was sent to [facility]. Called and spoke with patient's daughter-Terri to make her aware. She will have pharmacy delivery medications or pick them up to start to today. She is appreciative.

## 2021-08-26 ENCOUNTER — Telehealth: Payer: Self-pay

## 2021-08-26 NOTE — Progress Notes (Signed)
[facility] COMMUNITY PALLIATIVE CARE RN NOTE  PATIENT NAME: Melanie Walker DOB: 09/13/1930 MRN: [id number]  PRIMARY CARE PROVIDER: Lajean Manes, MD  RESPONSIBLE PARTY: Marcial Pacas (daughter) Acct ID - Guarantor Home Phone Work Phone Relationship Acct Type  [id number] Thomasene [phone]  Self P/F     [address], [place], [place] [address]   RN follow up palliative care visit completed with patient and caregiver in her home.   Upon arrival patient is sitting up in her recliner with legs elevated watching tv. Her daughter [name] is out of town so [place], companion, is present during visit. She denies pain or discomfort at this time. Melanie Walker states that she c/o generalized pain this morning and took some Tylenol which helped. She has been more fatigued lately and only wanting to lie in bed most of the day. Her appetite remains good, but patient is losing weight. Daughter trying to get patient to push fluids. Weight was 97 lbs 11 months ago and is now 84.6. Daughter also suspects patient may have a UTI. Patient says she does have painful/burning urination at times. Obtained u/a c&s by clean catch ordered by Melanie Hippo FNP and took to [facility]. After getting specimen patient wanted to return back to bed. Spoke with Terri after visit to provide update. She is appreciative. Advised we will call her again once lab results are in. Palliative care team will continue to follow.   PHYSICAL EXAM:   VITALS: Today's Vitals   08/22/21 1324  BP: 112/64  Pulse: 78  Resp: 17  Temp: 97.9 F (36.6 C)  TempSrc: Temporal  SpO2: 94%  Weight: 84 lb 9.6 oz (38.4 kg)  Height: 5\' 1"  (1.549 m)  PainSc: 0-No pain    LUNGS: clear to auscultation  CARDIAC: Cor RRR EXTREMITIES: No edema SKIN:  Thin/frain skin: Exposed skin is dry and intact   NEURO:  Alert and oriented to person/place, forgetful, increased generalized weakness, ambulatory w/rollator walker   (Duration of visit and documentation 60  minutes)   Daryl Eastern, RN BSN

## 2021-08-26 NOTE — Telephone Encounter (Signed)
(  3:20 pm) PC SW emailed patient's daughter resources for in-home caregiving support.

## 2021-09-08 ENCOUNTER — Telehealth: Payer: Self-pay

## 2021-09-08 NOTE — Telephone Encounter (Signed)
Volunteer check in call made for palliative care, no answer 

## 2021-09-10 ENCOUNTER — Other Ambulatory Visit: Payer: Medicare HMO | Admitting: Family Medicine

## 2021-09-10 ENCOUNTER — Encounter: Payer: Self-pay | Admitting: Family Medicine

## 2021-09-10 ENCOUNTER — Encounter: Payer: Self-pay | Admitting: *Deleted

## 2021-09-10 VITALS — BP 128/82 | HR 72 | Resp 26

## 2021-09-10 DIAGNOSIS — C3491 Malignant neoplasm of unspecified part of right bronchus or lung: Secondary | ICD-10-CM

## 2021-09-10 DIAGNOSIS — R0602 Shortness of breath: Secondary | ICD-10-CM

## 2021-09-10 DIAGNOSIS — E43 Unspecified severe protein-calorie malnutrition: Secondary | ICD-10-CM

## 2021-09-10 DIAGNOSIS — Z7401 Bed confinement status: Secondary | ICD-10-CM

## 2021-09-10 DIAGNOSIS — Z515 Encounter for palliative care: Secondary | ICD-10-CM

## 2021-09-10 DIAGNOSIS — R8290 Unspecified abnormal findings in urine: Secondary | ICD-10-CM

## 2021-09-10 NOTE — Progress Notes (Signed)
Faxed order for a portable 2 view chest x-ray to [facility] as requested by Damaris Hippo FNP. Confirmation fax received.

## 2021-09-11 ENCOUNTER — Encounter: Payer: Self-pay | Admitting: Family Medicine

## 2021-09-11 DIAGNOSIS — R8290 Unspecified abnormal findings in urine: Secondary | ICD-10-CM | POA: Insufficient documentation

## 2021-09-11 DIAGNOSIS — R918 Other nonspecific abnormal finding of lung field: Secondary | ICD-10-CM | POA: Diagnosis not present

## 2021-09-11 DIAGNOSIS — R0602 Shortness of breath: Secondary | ICD-10-CM | POA: Insufficient documentation

## 2021-09-11 DIAGNOSIS — Z515 Encounter for palliative care: Secondary | ICD-10-CM | POA: Insufficient documentation

## 2021-09-11 DIAGNOSIS — Z7401 Bed confinement status: Secondary | ICD-10-CM | POA: Insufficient documentation

## 2021-09-11 NOTE — Progress Notes (Signed)
[facility] Consult Note Telephone: [phone]  Fax: [phone]    Date of encounter: 09/10/2021 2:10 PM  PATIENT NAME: Melanie Walker [name] [id number]   [phone] (home)  DOB: [birth year] MRN: [id number] PRIMARY CARE PROVIDER:    Lajean Manes, MD,  [place] [employer] [address] [place] [address] [place] [address] [phone]  REFERRING PROVIDER:   Lajean Manes, MD [address] [place] [address] [place],  [place] [address] [phone]  RESPONSIBLE PARTY:    Contact Information     Name Relation Home Work Mobile   Furgeson,Terri Daughter [phone]  [phone]        I met face to face with patient and daughter in their home.  Palliative Care was asked to follow this patient by consultation request of  Lajean Manes, MD to address advance care planning and complex medical decision making. This is a follow up visit.  ASSESSMENT, SYMPTOM MANAGEMENT AND PLAN / RECOMMENDATIONS:   Shortness of breath Tachypneic and hypoxic at rest, with crackles LLL Bedbound since March, question possible aspiration or CAP superimposed on bronchogenic cancer RLL Portable mobile CXR-PA view ordered. PCN/Cephalosporin allergy-start empiric Levofloxacin 500 mg po daily x 5 days Re-evaluate Monday   Bronchogenic cancer of right lung Hx of RLL spiculated mass without bx proven cancer, suspected by CCM pulmonologist from CT and PET scan. No treatment    Palliative Care Encounter Primary caregiver is daughter wants to keep patient comfortable and at home if possible, requesting hospital bed to be able to elevate head and overbed table for ease of meals. Daughter asking about possible hospice care for patient-will consult PCP   Abnormal urine odor Noted abnormal urine odor with occasional urge sensation, recently treated in the last 2 months with course of Cipro for culture proven UTI Levofloxacin should help cover urinary  organisms if present.   Bedbound/protein calorie malnutrition Has been bedbound since March Last Albumin 06/23/20 was 2.5. Weight loss from 05/15/20 104.6 to last weight 08/22/21 84 lbs 9.6 ounces    Advance Care Planning/Goals of Care: Goals include to maximize quality of life and symptom management. Health care surrogate gave her permission to discuss. Our advance care planning conversation included a discussion about:    The value and importance of advance care planning  Exploration of personal, cultural or spiritual beliefs that might influence medical decisions-daughter Melanie Walker is sole caregiver Exploration of goals of care in the event of a sudden injury or illness  Identification of a healthcare agent-daughter Terri Daughter asking about Hospice Care CODE STATUS: Full Code at present, likely change to comfort care as daughter wants to keep pt at home.    Follow up Palliative Care Visit: Palliative care will continue to follow for complex medical decision making, advance care planning, and clarification of goals. Return 1 weeks or prn.   This visit was coded based on medical decision making (MDM).  PPS: 30%  HOSPICE ELIGIBILITY/DIAGNOSIS:  Respiratory failure secondary to bronchogenic cancer of right lung, no treatment, complicated by severe protein calorie malnutrition  Chief Complaint:  Palliative Care is following with acute visit for c/o urinary urgency and odor.    HISTORY OF PRESENT ILLNESS:  LEDORA Walker is a 88 y.o. year old femalee with bronchogenic cancer of right lung for which pt has been unable to complete staging and bx but has not had treatment, currently complicated by acute hypoxic respiratory failure.  She also has COPD, complex partial seizure, HTN, hx  of mild TBI, recent UTI and fall risk with generalized debility.  Most information was obtained from pt's sole caregiver-her daughter Melanie Walker.  Terri called c/o "bad odor to pt's urine like when she had UTI before".   Melanie Walker states pt had a fall and bruised her tailbone in March and since then has been bedbound and total care for bathing and dressing. Only up to transfer to bedside commode and has been resistant to having PT come work with her.  Melanie Walker states she has had some occasional cough.  Pt states "I feel lousy all over." She was treated in May for culture proven UTI and at that time her weight was down to 84 lbs 9.6 ounces.  Pt has been unable to get in to the doctor's office recently.  Daughter did not want to transport pt to urgent care or ER today, wants care at home if possible.  Daughter reports appetite has been good but pt continues to lose weight. Per noted from Dr Leory Plowman [facility]card CCM Pulmonologist pt had a RLL spiculated mass suspected to be a primary bronchogenic cancer based off CT and PET scan appearance.  She is supposed to follow up repeat CT scan in the near future but has not received any treatment for this mass.  She did have other pulmonary nodules in the left lung as well and a small adjacent pleural effusion on the right at time of last CT. Pt denies one source of pain and c/o fatigue, occasional SOB.  History obtained from review of EMR, discussion with daughter Melanie Walker and/or Ms. Lavergne.  I reviewed available labs, medications, imaging, studies and related documents from the EMR.  Records reviewed and summarized above.   ROS General: NAD EYES: denies vision changes ENMT: denies dysphagia but eats laying down Cardiovascular: denies chest pain, denies PND, endorses DOE Pulmonary: endorses occasional NP cough, endorses increased SOB Abdomen: endorses good appetite, denies constipation, endorses continence of bowel GU: denies dysuria, endorses continence of urine.   MSK:  endorses increased weakness, no recent falls reported Skin: denies rashes or wounds Neurological: endorses generalized discomfort, denies insomnia Psych: Endorses depressed mood Heme/lymph/immuno: denies bruises, abnormal  bleeding  Physical Exam: Current and past weights:  Weight loss from 05/15/20 104.6 to last weight 08/22/21 84 lbs 9.6 ounces Constitutional: NAD, appears flushed General: frail appearing, thin with muscle and subcu fat atrophy generalized EYES: anicteric sclera, lids intact, no discharge  ENMT: hard of hearing, oral mucous membranes moist, dentition intact CV: S1S2, RRR, no LE edema Pulmonary: fine crackles LLL, diminished RLL, tachypneic, no cough noted, room air Abdomen: normo-active BS + 4 quadrants, soft and non tender, no ascites GU: deferred MSK: noted sarcopenia of all 4 limbs, moves all extremities, bedbound Skin: warm and dry, no rashes or wounds on visible skin Neuro:  noted generalized weakness,  no cognitive impairment Psych: non-anxious/apathetic affect, lethargic/drowsy but arousable, oriented to self and condition Hem/lymph/immuno: no widespread bruising   Thank you for the opportunity to participate in the care of Ms. Dorner.  The palliative care team will continue to follow. Please call our office at [phone] if we can be of additional assistance.   Marijo Conception, FNP -C  COVID-19 PATIENT SCREENING TOOL Asked and negative response unless otherwise noted:   Have you had symptoms of covid, tested positive or been in contact with someone with symptoms/positive test in the past 5-10 days?  No

## 2021-09-12 ENCOUNTER — Other Ambulatory Visit: Payer: Medicare HMO | Admitting: *Deleted

## 2021-09-12 DIAGNOSIS — Z515 Encounter for palliative care: Secondary | ICD-10-CM

## 2021-09-15 NOTE — Progress Notes (Signed)
[facility] COMMUNITY PALLIATIVE CARE RN NOTE  PATIENT NAME: Melanie Walker DOB: 05-16-1930 MRN: [id number]  PRIMARY CARE PROVIDER: Lajean Manes, MD  RESPONSIBLE PARTY: Marcial Pacas (daughter) Acct ID - Guarantor Home Phone Work Phone Relationship Acct Type  [id number] Melanie Walker, [phone]  Self P/F     [address] [place], [place] [address]   RN telephonic encounter completed with patient's daughter [name]. She reports that patient's urine is very dark and has a strong odor and she is already currently on Levaquin (day 3 of 7) for suspected respiratory infection and possible UTI. I asked about fluid intake. She is not drinking fluids well. She now has caregivers who reports that they are having a difficult time getting her to drink water. Spoke with palliative NP who suggested Vitamin Water or for them to flavor her water with liquid flavoring. Daughter says patient was sitting up earlier this morning eating breakfast. They are working on trying to get her to sit up more as she usually likes to lie down most of the day. Her oxygen level was 93% which was an improvement from 2 days ago of 90-91%.   Portable chest x-ray results also came back today which showed mild diffuse bilateral interstitial densities compatible with interstitial pneumonitis vs interstitial scarring/pulmonary fibrosis.   Later received information from Damaris Hippo FNP that she was able to speak with Radonna Ricker, CMA with Dr. Carlyle Lipa office and received a hospice referral for this patient.   (Duration of visit and documentation 30 minutes)   Daryl Eastern, RN BSN

## 2021-09-19 ENCOUNTER — Telehealth: Payer: Self-pay | Admitting: Family Medicine

## 2021-09-19 NOTE — Telephone Encounter (Signed)
Late entry 09/12/21 at 4:45 pm: [facility] with Dr Felipa Eth indicating he was in agreement with prognosis of 6 months or less if disease takes its natural course and referral to Hospice. He was willing to remain attending. Asked Radonna Ricker to call referral in to [facility] to referral center.  Noted today 09/19/21 that pt's referral not done.  Sent message to [facility] Referral center with verbal agreement given last week to get pt admitted to Hospice.  Damaris Hippo FNP-C

## 2021-09-19 NOTE — Telephone Encounter (Signed)
Made referral to Hospice from verbal agreement provided by [facility] with Dr Felipa Eth last week.  Pt's daughter [name] was contacted by Hospice referral team to schedule admission visit and indicated that daughter was upset regarding Hospice Referral.  Had left a message for pt's daughter which she promptly returned.  Advised that when I had seen pt last week she (Terri) had asked me if pt was eligible for hospice, indicating that she needed more help with pt's care.  She had indicated that pt was staying in the bed and was refusing home health PT since her Covid infection 3 months ago. She had called to request an evaluation for possible UTI stating her intake both food and fluid was less and pt was more lethargic than usual.  At that time I had advised her that hospice would be able to follow pt more closely and had a HHA who could help with bathing which she indicated was a problem.  Apologized to [place] for any misunderstanding but thought from that conversation that she was open to Hospice if pt was eligible.  She acknowledged the conversation and stated "I was just shocked to hear about a 6 month time frame."   Advised her that this was a guideline used to indicate that if the disease took it's normal course that 6 months would be a reasonable prognosis given the disease burden and decline but was not an absolute.  Terri states, "I'm just going to believe in God that he will make it clear when we are there so I will know this is the right thing to do."  Advised that part of the reason I had referred her was her request for assistance with personal care and more close supervision of pt's health. She states now the patient is getting up out of bed and going to the kitchen to eat her meals so she is doing better but she would like to have her seen to follow up next week. Advised that I do not have openings next week but at best could look at the following week to see her.  Advised that recent CXR did not show a  pneumonia but we would be looking at her symptoms to know if she was improving and since she was eating, drinking and getting up and moving around that she was very much improving.  She asked if incentive spirometer would be helpful and was advised that this would not hurt and would help keep small airways open.  Advised that unless pt was doing worse we would not follow up with another CXR without a clear indication.  Also advised that having pt on hospice service would mean closer supervision and more help with pt's personal care but would not necessarily change anything else she was currently doing.  Advised that the provider would talk with her if pt had symptoms to manage to make recommendations and that she and her mother could accept or decline to follow recommendations.  She still states she will trust in God to know when the time is right.  Advised referral center to cancel Hospice referral and keep pt on Palliative Service for now.  Will follow up in 2 weeks if possible. Will notify Dr Carlyle Lipa office of caregiver declining Hospice service at present time.  Damaris Hippo FNP-C

## 2021-11-14 ENCOUNTER — Other Ambulatory Visit: Payer: Medicare HMO | Admitting: *Deleted

## 2021-11-14 DIAGNOSIS — Z515 Encounter for palliative care: Secondary | ICD-10-CM

## 2021-11-17 NOTE — Progress Notes (Signed)
[facility] COMMUNITY PALLIATIVE CARE RN NOTE  PATIENT NAME: BEDIE DOMINEY DOB: 24-Oct-1930 MRN: [id number]  PRIMARY CARE PROVIDER: Lajean Manes, MD  RESPONSIBLE PARTY: Marcial Pacas (daughter) Acct ID - Guarantor Home Phone Work Phone Relationship Acct Type  [id number] TEIGHAN, AUBERT615 [phone]  Self P/F     [address], [place], [place] [address]    RN telephonic encounter completed with patient's daughter [name]. She reports that patient is up a lot more in her recliner than previously noted 2 months ago. She is still having some coughing "here and there" and has some concerns. She used to have more of a wet cough, now it is more dry. She says her appetite is good. She is also interested in possible in home physical therapy for patient. She is requesting a follow up visit. In person follow up visit scheduled with Damaris Hippo NP on 12/11/21@2p .   Daryl Eastern, RN BSN

## 2021-11-21 ENCOUNTER — Telehealth: Payer: Self-pay

## 2021-11-21 NOTE — Telephone Encounter (Signed)
(  12:30 pm) SW returned a call to patient's daughter-Terri. Terri advised that patient was having some wheezing, congestion and coughing. Terri had also previously requested labs for patient. SW explained that the provider or RN was not available today and encouraged her to consider to take patient to her primary care provider for further assessment.   (1:18 pm) [name] called back advising that she did not want to take patient out to her PCP or an urgent care due to the germs. She asked if the palliative care NP could call in patient an antibiotic and prednisone for patient. SW advised her that the NP has not seen patient and therefore would not be able to provide any medication interventions. [name] thanked SW for returning her call.  *clinical team will be updated.

## 2021-12-11 ENCOUNTER — Other Ambulatory Visit: Payer: Medicare HMO | Admitting: Family Medicine

## 2021-12-11 VITALS — BP 134/76 | HR 62 | Resp 20

## 2021-12-11 DIAGNOSIS — K5900 Constipation, unspecified: Secondary | ICD-10-CM

## 2021-12-11 DIAGNOSIS — J441 Chronic obstructive pulmonary disease with (acute) exacerbation: Secondary | ICD-10-CM

## 2021-12-11 DIAGNOSIS — J9611 Chronic respiratory failure with hypoxia: Secondary | ICD-10-CM

## 2021-12-11 NOTE — Progress Notes (Unsigned)
[facility] Consult Note Telephone: [phone]  Fax: [phone]    Date of encounter: 12/11/21 2:33 PM PATIENT NAME: Melanie Walker [address] [place] [address]   [phone] (home)  DOB: 1930/04/23 MRN: [id number] PRIMARY CARE PROVIDER:    Lajean Manes, MD,  [place] [place] [address] [place] [address] [phone]  REFERRING PROVIDER:   Lajean Manes, MD [address] [place] [address] [place],  [place] [address] [phone]  RESPONSIBLE PARTY:    Contact Information     Name Relation Home Work Mobile   Walker,Melanie Daughter [phone]  [phone]        I met face to face with patient, daughter Melanie Walker and paid caregiver in her home. Palliative Care was asked to follow this patient by consultation request of  Melanie Manes, MD to address advance care planning and complex medical decision making. This is a follow up visit   ASSESSMENT , SYMPTOM MANAGEMENT AND PLAN / RECOMMENDATIONS:   Chronic respiratory failure/COPD exacerbation Continue O2. Prednisone 20 mg daily x 5 days. Levofloxacin 500 mg daily x 7 days Continue guaifenesin. Encourage increased fluid hydration. Encouraged use of flonase 1 spray BID.   Constipation Continue Miralax 17 gm daily and add Senokot 1 po BID prn  Advance Care Planning/Goals of Care: Goals include to maximize quality of life and symptom management.  CODE STATUS: Full code   Follow up Palliative Care Visit: Palliative care will continue to follow for complex medical decision making, advance care planning, and clarification of goals. Return 4 weeks or prn.   This visit was coded based on medical decision making (MDM).  PPS: 50%  HOSPICE ELIGIBILITY/DIAGNOSIS: TBD  Chief Complaint:  Palliative Care is continuing to follow patient for cough in setting of right lung cancer.    HISTORY OF PRESENT ILLNESS:  Melanie Walker is a 88 y.o. year old female with female  with bronchogenic cancer of right lung for which pt has been unable to complete staging and bx but has not had treatment, currently complicated by acute hypoxic respiratory failure.  She also has COPD, complex partial seizure, HTN, hx of mild TBI, recent UTI and fall risk with generalized debility.  Denies falls, SOB, dysuria, urinary frequency.  Eating and drinking well.  Having some constipation, just started Metamucil.  No trouble with coughing or choking after eating or drinking after.  Was coughing and has been taking Mucinex DM. She was having discolored nasal drainage and using children's flonase daily doesn't seem to be sufficient to control her PND/runny nose but the adult Flonase was too much.  Daughter requesting possible UA and CXR.  Educated that if pt not symptomatic for UTI then treatment unlikely but pt's symptoms are more consistent with COPD exacerbation.  Advised that would treat and if no improvement, consider xray as pt is hemodynamically stable. Pt is now ambulatory after months of being bedbound.  History obtained from review of EMR, discussion with family,  paid caregiver and/or Ms. Duan.   I reviewed EMR for available labs, medications, imaging, studies and related documents.  There are no new records since last visit.   ROS General: NAD ENMT: denies dysphagia Cardiovascular: denies chest pain, endorses DOE Pulmonary: endorses cough occasionally productive of clear sputum, denies increased SOB Abdomen: endorses variable appetite and intermittent constipation, endorses continence of bowel GU: denies dysuria, endorses continence of urine MSK:  endorses increased weakness,  no falls reported Skin: denies rashes or wounds Neurological:  denies pain, denies insomnia Psych: Endorses positive mood Heme/lymph/immuno: denies bruises, abnormal bleeding  Physical Exam: Current and past weights: 84 lbs 9.6 ounces as of 08/22/21 Constitutional: NAD General: frail appearing,  thin ENMT: hard of hearing even with hearing aids ni, oral mucous membranes moist, dentition intact CV: S1S2, RRR, no LE edema Pulmonary: Left lung scattered wheezing and right base expiratory wheezing, dyspnea at rest, no increased work of breathing, no cough Abdomen: normo-active BS + 4 quadrants, soft and non tender MSK: no sarcopenia, moves all extremities, ambulatory Skin: warm and dry, no rashes or wounds on visible skin Neuro:  noted generalized deconditioning,  no cognitive impairment Psych: non-anxious affect, A and O x 3 Hem/lymph/immuno: no widespread bruising   Thank you for the opportunity to participate in the care of [name].  The palliative care team will continue to follow. Please call our office at [phone] if we can be of additional assistance.   Melanie Conception, FNP -C  COVID-19 PATIENT SCREENING TOOL Asked and negative response unless otherwise noted:   Have you had symptoms of covid, tested positive or been in contact with someone with symptoms/positive test in the past 5-10 days?  Has had URI sx for 2-3 weeks, improving

## 2021-12-15 ENCOUNTER — Encounter: Payer: Self-pay | Admitting: Family Medicine

## 2021-12-15 DIAGNOSIS — J9611 Chronic respiratory failure with hypoxia: Secondary | ICD-10-CM | POA: Insufficient documentation

## 2021-12-15 DIAGNOSIS — K5900 Constipation, unspecified: Secondary | ICD-10-CM | POA: Insufficient documentation

## 2021-12-22 ENCOUNTER — Other Ambulatory Visit: Payer: Medicare HMO | Admitting: *Deleted

## 2021-12-22 DIAGNOSIS — Z515 Encounter for palliative care: Secondary | ICD-10-CM

## 2021-12-23 ENCOUNTER — Other Ambulatory Visit: Payer: Self-pay

## 2021-12-23 ENCOUNTER — Emergency Department (HOSPITAL_COMMUNITY): Payer: Medicare HMO

## 2021-12-23 ENCOUNTER — Telehealth: Payer: Self-pay | Admitting: *Deleted

## 2021-12-23 ENCOUNTER — Inpatient Hospital Stay (HOSPITAL_COMMUNITY)
Admission: EM | Admit: 2021-12-23 | Discharge: 2021-12-29 | DRG: 193 | Disposition: A | Discharge status: Hospice | Payer: Medicare HMO | Attending: Internal Medicine | Admitting: Internal Medicine

## 2021-12-23 ENCOUNTER — Encounter (HOSPITAL_COMMUNITY): Payer: Self-pay | Admitting: Internal Medicine

## 2021-12-23 DIAGNOSIS — R531 Weakness: Secondary | ICD-10-CM | POA: Diagnosis not present

## 2021-12-23 DIAGNOSIS — J9601 Acute respiratory failure with hypoxia: Secondary | ICD-10-CM | POA: Diagnosis not present

## 2021-12-23 DIAGNOSIS — Z85118 Personal history of other malignant neoplasm of bronchus and lung: Secondary | ICD-10-CM | POA: Diagnosis not present

## 2021-12-23 DIAGNOSIS — Z8616 Personal history of COVID-19: Secondary | ICD-10-CM

## 2021-12-23 DIAGNOSIS — Z79899 Other long term (current) drug therapy: Secondary | ICD-10-CM

## 2021-12-23 DIAGNOSIS — J189 Pneumonia, unspecified organism: Secondary | ICD-10-CM | POA: Diagnosis not present

## 2021-12-23 DIAGNOSIS — R918 Other nonspecific abnormal finding of lung field: Secondary | ICD-10-CM | POA: Diagnosis not present

## 2021-12-23 DIAGNOSIS — R627 Adult failure to thrive: Secondary | ICD-10-CM | POA: Diagnosis present

## 2021-12-23 DIAGNOSIS — Z7401 Bed confinement status: Secondary | ICD-10-CM

## 2021-12-23 DIAGNOSIS — Z9181 History of falling: Secondary | ICD-10-CM | POA: Diagnosis not present

## 2021-12-23 DIAGNOSIS — Z7952 Long term (current) use of systemic steroids: Secondary | ICD-10-CM | POA: Diagnosis not present

## 2021-12-23 DIAGNOSIS — E43 Unspecified severe protein-calorie malnutrition: Secondary | ICD-10-CM | POA: Insufficient documentation

## 2021-12-23 DIAGNOSIS — J439 Emphysema, unspecified: Secondary | ICD-10-CM | POA: Diagnosis not present

## 2021-12-23 DIAGNOSIS — J9600 Acute respiratory failure, unspecified whether with hypoxia or hypercapnia: Secondary | ICD-10-CM | POA: Diagnosis not present

## 2021-12-23 DIAGNOSIS — Z880 Allergy status to penicillin: Secondary | ICD-10-CM | POA: Diagnosis not present

## 2021-12-23 DIAGNOSIS — I959 Hypotension, unspecified: Secondary | ICD-10-CM | POA: Diagnosis not present

## 2021-12-23 DIAGNOSIS — Z882 Allergy status to sulfonamides status: Secondary | ICD-10-CM | POA: Diagnosis not present

## 2021-12-23 DIAGNOSIS — R0902 Hypoxemia: Secondary | ICD-10-CM | POA: Diagnosis not present

## 2021-12-23 DIAGNOSIS — Z515 Encounter for palliative care: Secondary | ICD-10-CM | POA: Diagnosis not present

## 2021-12-23 DIAGNOSIS — I10 Essential (primary) hypertension: Secondary | ICD-10-CM | POA: Diagnosis not present

## 2021-12-23 DIAGNOSIS — R64 Cachexia: Secondary | ICD-10-CM | POA: Diagnosis present

## 2021-12-23 DIAGNOSIS — Z91040 Latex allergy status: Secondary | ICD-10-CM

## 2021-12-23 DIAGNOSIS — J80 Acute respiratory distress syndrome: Secondary | ICD-10-CM | POA: Diagnosis not present

## 2021-12-23 DIAGNOSIS — J90 Pleural effusion, not elsewhere classified: Secondary | ICD-10-CM | POA: Diagnosis not present

## 2021-12-23 DIAGNOSIS — R5601 Complex febrile convulsions: Secondary | ICD-10-CM | POA: Diagnosis not present

## 2021-12-23 DIAGNOSIS — J449 Chronic obstructive pulmonary disease, unspecified: Secondary | ICD-10-CM | POA: Diagnosis not present

## 2021-12-23 DIAGNOSIS — R54 Age-related physical debility: Secondary | ICD-10-CM | POA: Diagnosis present

## 2021-12-23 DIAGNOSIS — Z681 Body mass index (BMI) 19 or less, adult: Secondary | ICD-10-CM

## 2021-12-23 DIAGNOSIS — Z888 Allergy status to other drugs, medicaments and biological substances status: Secondary | ICD-10-CM | POA: Diagnosis not present

## 2021-12-23 DIAGNOSIS — G40209 Localization-related (focal) (partial) symptomatic epilepsy and epileptic syndromes with complex partial seizures, not intractable, without status epilepticus: Secondary | ICD-10-CM | POA: Diagnosis not present

## 2021-12-23 DIAGNOSIS — R410 Disorientation, unspecified: Secondary | ICD-10-CM | POA: Diagnosis not present

## 2021-12-23 DIAGNOSIS — J8410 Pulmonary fibrosis, unspecified: Secondary | ICD-10-CM | POA: Diagnosis not present

## 2021-12-23 DIAGNOSIS — I700 Atherosclerosis of aorta: Secondary | ICD-10-CM | POA: Diagnosis present

## 2021-12-23 DIAGNOSIS — J188 Other pneumonia, unspecified organism: Secondary | ICD-10-CM | POA: Diagnosis not present

## 2021-12-23 DIAGNOSIS — Z7189 Other specified counseling: Secondary | ICD-10-CM | POA: Diagnosis not present

## 2021-12-23 LAB — CBC WITH DIFFERENTIAL/PLATELET
Abs Immature Granulocytes: 0.09 10*3/uL — ABNORMAL HIGH (ref 0.00–0.07)
Basophils Absolute: 0.1 10*3/uL (ref 0.0–0.1)
Basophils Relative: 1 %
Eosinophils Absolute: 0.1 10*3/uL (ref 0.0–0.5)
Eosinophils Relative: 1 %
HCT: 41.5 % (ref 36.0–46.0)
Hemoglobin: 13.2 g/dL (ref 12.0–15.0)
Immature Granulocytes: 1 %
Lymphocytes Relative: 13 %
Lymphs Abs: 1.1 10*3/uL (ref 0.7–4.0)
MCH: 27.6 pg (ref 26.0–34.0)
MCHC: 31.8 g/dL (ref 30.0–36.0)
MCV: 86.8 fL (ref 80.0–100.0)
Monocytes Absolute: 1 10*3/uL (ref 0.1–1.0)
Monocytes Relative: 12 %
Neutro Abs: 6.1 10*3/uL (ref 1.7–7.7)
Neutrophils Relative %: 72 %
Platelets: 447 10*3/uL — ABNORMAL HIGH (ref 150–400)
RBC: 4.78 MIL/uL (ref 3.87–5.11)
RDW: 16.8 % — ABNORMAL HIGH (ref 11.5–15.5)
WBC: 8.4 10*3/uL (ref 4.0–10.5)
nRBC: 0 % (ref 0.0–0.2)

## 2021-12-23 LAB — BLOOD GAS, VENOUS
Acid-Base Excess: 4.2 mmol/L — ABNORMAL HIGH (ref 0.0–2.0)
Bicarbonate: 30.3 mmol/L — ABNORMAL HIGH (ref 20.0–28.0)
O2 Saturation: 59.2 %
Patient temperature: 37
pCO2, Ven: 50 mmHg (ref 44–60)
pH, Ven: 7.39 (ref 7.25–7.43)
pO2, Ven: 37 mmHg (ref 32–45)

## 2021-12-23 LAB — BASIC METABOLIC PANEL
Anion gap: 7 (ref 5–15)
BUN: 14 mg/dL (ref 8–23)
CO2: 26 mmol/L (ref 22–32)
Calcium: 7.5 mg/dL — ABNORMAL LOW (ref 8.9–10.3)
Chloride: 103 mmol/L (ref 98–111)
Creatinine, Ser: 0.41 mg/dL — ABNORMAL LOW (ref 0.44–1.00)
GFR, Estimated: >60 mL/min (ref >60)
Glucose, Bld: 89 mg/dL (ref 70–99)
Potassium: 4.2 mmol/L (ref 3.5–5.1)
Sodium: 136 mmol/L (ref 135–145)

## 2021-12-23 LAB — TROPONIN I (HIGH SENSITIVITY)
Troponin I (High Sensitivity): 10 ng/L (ref <18)
Troponin I (High Sensitivity): 10 ng/L (ref <18)

## 2021-12-23 LAB — BRAIN NATRIURETIC PEPTIDE: B Natriuretic Peptide: 98.5 pg/mL (ref 0.0–100.0)

## 2021-12-23 MED ORDER — ARFORMOTEROL TARTRATE 15 MCG/2ML IN NEBU
15.0000 ug | INHALATION_SOLUTION | Freq: Two times a day (BID) | RESPIRATORY_TRACT | Status: DC
  Administered 2021-12-23 – 2021-12-29 (×12): 15 ug via RESPIRATORY_TRACT
  Filled 2021-12-23 (×12): qty 2

## 2021-12-23 MED ORDER — ENOXAPARIN SODIUM 40 MG/0.4ML IJ SOSY: 40.0000 mg | PREFILLED_SYRINGE | INTRAMUSCULAR | Status: DC

## 2021-12-23 MED ORDER — SODIUM CHLORIDE 0.9 % IV SOLN
2.0000 g | INTRAVENOUS | Status: DC
  Administered 2021-12-23 – 2021-12-26 (×4): 2 g via INTRAVENOUS
  Filled 2021-12-23 (×4): qty 12.5

## 2021-12-23 MED ORDER — ACETAMINOPHEN 325 MG PO TABS
650.0000 mg | ORAL_TABLET | Freq: Four times a day (QID) | ORAL | Status: DC | PRN
  Administered 2021-12-25 – 2021-12-29 (×6): 650 mg via ORAL
  Filled 2021-12-23 (×6): qty 2

## 2021-12-23 MED ORDER — HEPARIN SODIUM (PORCINE) 5000 UNIT/ML IJ SOLN
5000.0000 [IU] | Freq: Three times a day (TID) | INTRAMUSCULAR | Status: DC
  Administered 2021-12-23 – 2021-12-29 (×17): 5000 [IU] via SUBCUTANEOUS
  Filled 2021-12-23 (×17): qty 1

## 2021-12-23 MED ORDER — ONDANSETRON HCL 4 MG/2ML IJ SOLN: 4.0000 mg | Freq: Four times a day (QID) | INTRAMUSCULAR | Status: DC | PRN

## 2021-12-23 MED ORDER — SODIUM CHLORIDE (PF) 0.9 % IJ SOLN
INTRAMUSCULAR | Status: AC
  Filled 2021-12-23: qty 50

## 2021-12-23 MED ORDER — IPRATROPIUM-ALBUTEROL 0.5-2.5 (3) MG/3ML IN SOLN: 3.0000 mL | Freq: Four times a day (QID) | RESPIRATORY_TRACT | Status: DC | PRN

## 2021-12-23 MED ORDER — SODIUM CHLORIDE 0.9 % IV SOLN
1.0000 g | INTRAVENOUS | Status: DC
  Administered 2021-12-23: 1 g via INTRAVENOUS
  Filled 2021-12-23: qty 10

## 2021-12-23 MED ORDER — ACETAMINOPHEN 650 MG RE SUPP: 650.0000 mg | Freq: Four times a day (QID) | RECTAL | Status: DC | PRN

## 2021-12-23 MED ORDER — VANCOMYCIN HCL 750 MG/150ML IV SOLN
750.0000 mg | INTRAVENOUS | Status: DC
  Administered 2021-12-23 – 2021-12-25 (×2): 750 mg via INTRAVENOUS
  Filled 2021-12-23 (×3): qty 150

## 2021-12-23 MED ORDER — IOHEXOL 300 MG/ML  SOLN
100.0000 mL | Freq: Once | INTRAMUSCULAR | Status: AC | PRN
  Administered 2021-12-23: 100 mL via INTRAVENOUS

## 2021-12-23 MED ORDER — REVEFENACIN 175 MCG/3ML IN SOLN
175.0000 ug | Freq: Every day | RESPIRATORY_TRACT | Status: DC
  Administered 2021-12-24 – 2021-12-29 (×6): 175 ug via RESPIRATORY_TRACT
  Filled 2021-12-23 (×6): qty 3

## 2021-12-23 MED ORDER — REVEFENACIN 175 MCG/3ML IN SOLN
175.0000 ug | Freq: Every day | RESPIRATORY_TRACT | Status: DC
  Filled 2021-12-23: qty 3

## 2021-12-23 MED ORDER — OXYCODONE HCL 5 MG PO TABS: 5.0000 mg | ORAL_TABLET | ORAL | Status: DC | PRN

## 2021-12-23 MED ORDER — METRONIDAZOLE 500 MG/100ML IV SOLN
500.0000 mg | Freq: Once | INTRAVENOUS | Status: AC
  Administered 2021-12-23: 500 mg via INTRAVENOUS
  Filled 2021-12-23: qty 100

## 2021-12-23 MED ORDER — ONDANSETRON HCL 4 MG PO TABS: 4.0000 mg | ORAL_TABLET | Freq: Four times a day (QID) | ORAL | Status: DC | PRN

## 2021-12-23 NOTE — ED Notes (Signed)
Sandwich and water given.

## 2021-12-23 NOTE — Telephone Encounter (Signed)
Received a call back from Tate with Dr. Carlyle Lipa office. She says that Dr. Felipa Eth agrees with a hospice consult for this patient and agrees to be the attending.   I was also made aware by patient's daughter-Terri that she is currently in the ED for low oxygen levels. I made her PCP aware of this also.  I also sent a communication to our hospice referral center and hospital liaison team to make them aware of her current disposition.

## 2021-12-23 NOTE — Progress Notes (Signed)
Pharmacy Antibiotic Note  Melanie Walker is a 88 y.o. female admitted on 12/23/2021 with pneumonia.  Pharmacy has been consulted for Vancomycin and Cefepime dosing.  Plan: Cefepime 2g IV q24h Vancomycin 750 mg IV q48h  (SCr rounded to 0.8, TBW < IBW, est AUC 473) Measure Vanc levels as needed.  Goal AUC = 400 - 550  Follow up renal function, culture results, and clinical course.   Height: 4' 11.5" (151.1 cm) Weight: 38.6 kg (85 lb) IBW/kg (Calculated) : 44.35  Temp (24hrs), Avg:97.9 F (36.6 C), Min:97.7 F (36.5 C), Max:98 F (36.7 C)  Recent Labs  Lab 12/23/21 1010  WBC 8.4  CREATININE 0.41*    Estimated Creatinine Clearance: 29.1 mL/min (A) (by C-G formula based on SCr of 0.41 mg/dL (L)).    Allergies  Allergen Reactions   Biaxin [Clarithromycin] Other (See Comments)    Unknown per Daughter    Jaclyn Prime [Ibandronic Acid] Other (See Comments)    Upset stomach   Ceftin [Cefuroxime Axetil] Other (See Comments)    Unknown per Daughter    Codeine Other (See Comments)    Unknown per Daughter    Dilantin [Phenytoin Sodium Extended] Other (See Comments)    Unknown per Daughter    Fosamax [Alendronate Sodium] Other (See Comments)    Upset stomach   Guaifenesin & Derivatives Other (See Comments)    Unknown per Daughter    Imitrex [Sumatriptan] Other (See Comments)    Unknown per Daughter    Keflex [Cephalexin] Other (See Comments)    Unknown per Daughter    Macrobid [Nitrofurantoin Macrocrystal] Hives and Itching   Montelukast Other (See Comments)    Unknown per Daughter    Other Other (See Comments)    Flu shot made rash on arm and caused itching to arm   Penicillins Other (See Comments)    Unknown per Daughter    Premarin [Conjugated Estrogens] Itching   Sulfa Antibiotics Other (See Comments)    Unknown per Daughter    Hctz [Hydrochlorothiazide] Rash   Latex Rash    Antimicrobials this admission: 9/19 Ceftriaxone  9/19 metronidazole  9/19 Vancomycin  9/19  Cefepime  Dose adjustments this admission:   Microbiology results: 9/19 BCx:  9/19 Sputum  9/19 MRS PCR:   Thank you for allowing pharmacy to be a part of this patient's care.  Gretta Arab PharmD, BCPS Clinical Pharmacist [facility] main pharmacy [phone] 12/23/2021 2:26 PM

## 2021-12-23 NOTE — Progress Notes (Signed)
Manufacturing engineer [facility]) Hospital Liaison Note  This is a pending [place] hospice patient. We will follow while hospitalized. Please call with any questions or concerns. Thank you  Roselee Nova, [place] Hospital Liaison [phone]

## 2021-12-23 NOTE — Progress Notes (Signed)
Attempted to obtain MRSA PCR swab. Daughter declined on behalf of patient. Attending hospitalist made aware.

## 2021-12-23 NOTE — Plan of Care (Signed)

## 2021-12-23 NOTE — Consult Note (Signed)
NAME:  Melanie Walker, MRN:  [id number], DOB:  07-02-1930, LOS: 0 ADMISSION DATE:  12/23/2021, CONSULTATION DATE:  12/23/21 REFERRING MD:  Melanie Walker CHIEF COMPLAINT:  Dyspnea, weakness   History of Present Illness:  Melanie Walker is a 88 y.o. female who has a PMH as below including but not limited suspected lung CA (seen by Melanie Walker 06/10/20 after CT from 03/16/20 showed an enlarging spiculated 2.6 x 2.6cm RLL nodule with an adjacent more superior satellite nodule at 1.6 x 1.5 cm). She was never able to complete further evaluation including biopsy/staging.  She has been followed by palliative care in the outpatient setting.  She was seen by them 12/11/21 for dyspnea, cough, weakness. She was felt to have COPD exacerbation vs PNA and was subsequently given 20mg  Prednisone x 5 days along with Levaquin 500mg  x 7 days. She completed these and had some mild improvement; however, 2 days later began to feel bad again. On 9/19, she was brought to [facility] ED for worsened symptoms including fatigue, cough, dyspnea on and off, hypoxia to the mid to high 80s.  In ED, CXR demonstrated right laterally loculated pleural effusion. CT chest was obtained and confirmed effusion along with bilateral infiltrates, emphysema, fibrosis.  She was admitted by [facility] and PCCM was called in consultation for consideration of thora.  After discussion with pt and her daughter Melanie Walker at bedside, they are unsure whether they would want to pursue a thora. They are leaning towards continuing with abx and conservative measures for the next 12 - 24 hours then reassessing pt's progress.  If no improvement or worse (ie she were to develop distress), then they would likely consent to thora. For the time being, pt is resting comfortably on 2L - 3L O2 with sats in the high 90s. She has no increased work of breathing with rest.  Pertinent  Medical History:  has Complex partial seizure ([place]); Numbness; Low back pain; Seizure ([place]); Risk for falls; Essential  hypertension; Abnormality of gait; Paresthesia; Lumbar radiculopathy; Mild traumatic brain injury ([place]); Acute respiratory failure due to COVID-19 [facility]); Acute urinary retention; Acute metabolic encephalopathy; 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HCC); COPD with emphysema ([place]); UTI (urinary tract infection)-gram negative; Shortness of breath; Palliative care encounter; Abnormal urine odor; Bedbound; Chronic respiratory failure with hypoxia ([place]); Constipation; and Acute respiratory failure with hypoxia ([place]) on their problem list.   Significant Hospital Events: Including procedures, antibiotic start and stop dates in addition to other pertinent events   9/19 admit.  Interim History / Subjective:  Comfortable on 2 - 3L O2.  Objective:  Blood pressure 134/84, pulse 95, temperature 97.7 F (36.5 C), temperature source Oral, resp. rate (!) 22, SpO2 92 %.       No intake or output data in the 24 hours ending 12/23/21 1406 There were no vitals filed for this visit.  Examination: General: Elderly female, chronically ill appearing, resting in bed, in NAD. Neuro: A&O x 3, no deficits but hard of hearing. HEENT: Melanie Walker/AT. Sclerae anicteric. EOMI. Cardiovascular: RRR, no M/R/G.  Lungs: Respirations even and unlabored.  Coarse bilaterally with expiratory wheezes. Abdomen: BS x 4, soft, NT/ND.  Musculoskeletal: No gross deformities, no edema.  Skin: Intact, warm, no rashes.  Labs/imaging personally reviewed:  CT chest 9/19 >   Assessment & Plan:   Right pleural effusion - unclear etiology, most likely on differential is parapneumonic vs malignant. - Pt and daughter would like some time to think on whether they would want to  pursue a thoracentesis or not. I informed them that Melanie Walker would be by this afternoon for further questions.  PNA - recent course of Levaquin noted. - Would treat with Vanc / Cefepime for now, can d/c Vanc if MRSA nares is negative. - Blood and sputum cultures. -  Bronchial hygiene.  Acute hypoxic respiratory failure - 2/2 above. - Continue supplemental O2 as needed to maintain SpO2 > 90%. - Bronchial hygiene. - Mobilize as able. - Needs ambulatory desaturation study prior to d/c.  Generalized deconditioning and failure to thrive - per daughter, has been very sedentary since previous episode of PNA followed by UTI. - PT eval. - Mobilize as able.   Rest per primary team. PCCM will follow up tomorrow 9/19 and if any worse then will recommend right thora both diagnostic and therapeutic.   Best practice (evaluated daily):  Per primary team.  Labs   CBC: Recent Labs  Lab 12/23/21 1010  WBC 8.4  NEUTROABS 6.1  HGB 13.2  HCT 41.5  MCV 86.8  PLT 447*    Basic Metabolic Panel: Recent Labs  Lab 12/23/21 1010  NA 136  K 4.2  CL 103  CO2 26  GLUCOSE 89  BUN 14  CREATININE 0.41*  CALCIUM 7.5*   GFR: CrCl cannot be calculated (Unknown ideal weight.). Recent Labs  Lab 12/23/21 1010  WBC 8.4    Liver Function Tests: No results for input(s): "AST", "ALT", "ALKPHOS", "BILITOT", "PROT", "ALBUMIN" in the last 168 hours. No results for input(s): "LIPASE", "AMYLASE" in the last 168 hours. No results for input(s): "AMMONIA" in the last 168 hours.  ABG    Component Value Date/Time   HCO3 30.3 (H) 12/23/2021 1050   O2SAT 59.2 12/23/2021 1050     Coagulation Profile: No results for input(s): "INR", "PROTIME" in the last 168 hours.  Cardiac Enzymes: No results for input(s): "CKTOTAL", "CKMB", "CKMBINDEX", "TROPONINI" in the last 168 hours.  HbA1C: No results found for: "HGBA1C"  CBG: No results for input(s): "GLUCAP" in the last 168 hours.  Review of Systems:   All negative; except for those that are bolded, which indicate positives.  Constitutional: weight loss, weight gain, night sweats, fevers, chills, fatigue, weakness.  HEENT: headaches, sore throat, sneezing, nasal congestion, post nasal drip, difficulty swallowing,  tooth/dental problems, visual complaints, visual changes, ear aches. Neuro: difficulty with speech, weakness, numbness, ataxia. CV:  chest pain, orthopnea, PND, swelling in lower extremities, dizziness, palpitations, syncope.  Resp: cough, hemoptysis, dyspnea, wheezing. GI: heartburn, indigestion, abdominal pain, nausea, vomiting, diarrhea, constipation, change in bowel habits, loss of appetite, hematemesis, melena, hematochezia.  GU: dysuria, change in color of urine, urgency or frequency, flank pain, hematuria. MSK: joint pain or swelling, decreased range of motion. Psych: change in mood or affect, depression, anxiety, suicidal ideations, homicidal ideations. Skin: rash, itching, bruising.   Past Medical History:  She,  has a past medical history of Cancer ([place]) and Seizure ([place]).   Surgical History:   Past Surgical History:  Procedure Laterality Date   none       Social History:   reports that she has never smoked. She has never used smokeless tobacco. She reports that she does not drink alcohol and does not use drugs.   Family History:  Her family history includes Heart Problems in her father and mother.   Allergies Allergies  Allergen Reactions   Biaxin [Clarithromycin] Other (See Comments)    Unknown per Daughter    Jaclyn Prime [Ibandronic Acid] Other (See Comments)  Upset stomach   Ceftin [Cefuroxime Axetil] Other (See Comments)    Unknown per Daughter    Codeine Other (See Comments)    Unknown per Daughter    Dilantin [Phenytoin Sodium Extended] Other (See Comments)    Unknown per Daughter    Fosamax [Alendronate Sodium] Other (See Comments)    Upset stomach   Guaifenesin & Derivatives Other (See Comments)    Unknown per Daughter    Imitrex [Sumatriptan] Other (See Comments)    Unknown per Daughter    Keflex [Cephalexin] Other (See Comments)    Unknown per Daughter    Macrobid [Nitrofurantoin Macrocrystal] Hives and Itching   Montelukast Other (See Comments)     Unknown per Daughter    Other Other (See Comments)    Flu shot made rash on arm and caused itching to arm   Penicillins Other (See Comments)    Unknown per Daughter    Premarin [Conjugated Estrogens] Itching   Sulfa Antibiotics Other (See Comments)    Unknown per Daughter    Hctz [Hydrochlorothiazide] Rash   Latex Rash     Home Medications  Prior to Admission medications   Medication Sig Start Date End Date Taking? Authorizing Provider  amLODipine (NORVASC) 5 MG tablet Take 5 mg by mouth daily. 06/20/18   [provider]  cetirizine (ZYRTEC ALLERGY) 10 MG tablet Take 1 tablet (10 mg total) by mouth daily. 06/12/20   Blanchie Dessert, MD  levofloxacin (LEVAQUIN) 500 MG tablet Take 500 mg by mouth daily. 12/11/21   [provider]  melatonin 10 MG TABS Take 10 mg by mouth at bedtime as needed. 07/01/20   Little Ishikawa, MD  Multiple Vitamins-Minerals (SENTRY SENIOR PO) Take 1 tablet by mouth daily.    [provider]  predniSONE (DELTASONE) 20 MG tablet Take 20 mg by mouth daily. 12/11/21   [provider]      Montey Hora, [place] For pager details, please see AMION or use Epic chat  After 1900, please call [facility] for cross coverage needs 12/23/2021, 2:06 PM

## 2021-12-23 NOTE — ED Triage Notes (Signed)
Pt BIBA from home for generalized weakness worse the last 2 days. Treated last week with abx for congestion. No distress. 82% on RA, does not wear oxygen. 100% on 2L [place]. 20ga LF 200NS.  CBG 120 100% 2L [place]

## 2021-12-23 NOTE — H&P (Signed)
History and Physical    Patient: Melanie Walker DGU:[id number] DOB: [birth year] DOA: 12/23/2021 DOS: the patient was seen and examined on 12/23/2021 PCP: Lajean Manes, MD  Patient coming from: Home  Chief Complaint:  Chief Complaint  Patient presents with   Weakness   Shortness of Breath   HPI: Melanie Walker is a 88 y.o. female with medical history significant of HTN, complex partial seizures, emphysema. Presenting with weakness and shortness of breath. History is from daughter at bedside. She reports that the patient has had multiple rounds of antibiotics and steroids over the last couple of weeks d/t increased cough and presumed COPD exacerbation. She completed her last round of abx about a week ago. She initially seemed better. However, over these past few days she has become progressively weak and more hypoxic. The daughter notes that they have seen SpO2 reading down into the upper 80's at home. During this time she has not complained of CP or palpitations. They have not noted fevers at home. She has not had any sick contacts. Her PO intake has worsened. When her symptoms did not improve, family called for EMS. She denies any other aggravating or alleviating factors.   Review of Systems: As mentioned in the history of present illness. All other systems reviewed and are negative. Past Medical History:  Diagnosis Date   Cancer ([place])    Seizure [facility])    Past Surgical History:  Procedure Laterality Date   none     Social History:  reports that she has never smoked. She has never used smokeless tobacco. She reports that she does not drink alcohol and does not use drugs.  Allergies  Allergen Reactions   Biaxin [Clarithromycin]    Boniva [Ibandronic Acid] Other (See Comments)    Upset stomach   Ceftin [Cefuroxime Axetil]    Dilantin [Phenytoin Sodium Extended]    Fosamax [Alendronate Sodium] Other (See Comments)    Upset stomach   Guaifenesin & Derivatives    Imitrex [Sumatriptan]     Keflex [Cephalexin]    Macrobid [Nitrofurantoin Macrocrystal] Hives and Itching   Montelukast    Other     Flu shot made rash on arm and caused itching to arm   Penicillins    Premarin [Conjugated Estrogens] Itching   Sulfa Antibiotics    Hctz [Hydrochlorothiazide] Rash   Latex Rash    Family History  Problem Relation Age of Onset   Heart Problems Mother    Heart Problems Father     Prior to Admission medications   Medication Sig Start Date End Date Taking? Authorizing Provider  amLODipine (NORVASC) 5 MG tablet Take 5 mg by mouth daily. 06/20/18   [provider]  cetirizine (ZYRTEC ALLERGY) 10 MG tablet Take 1 tablet (10 mg total) by mouth daily. 06/12/20   Blanchie Dessert, MD  levofloxacin (LEVAQUIN) 500 MG tablet Take 500 mg by mouth daily. 12/11/21   [provider]  melatonin 10 MG TABS Take 10 mg by mouth at bedtime as needed. 07/01/20   Little Ishikawa, MD  Multiple Vitamins-Minerals (SENTRY SENIOR PO) Take 1 tablet by mouth daily.    [provider]  predniSONE (DELTASONE) 20 MG tablet Take 20 mg by mouth daily. 12/11/21   [provider]    Physical Exam: Vitals:   12/23/21 0943 12/23/21 1130 12/23/21 1200  BP: (!) 140/73 135/80 (!) 140/67  Pulse: 81 75 82  Resp: 14 19 20   Temp: 98 F (36.7 C)  TempSrc: Oral    SpO2: 93% 99% 100%   General: 88 y.o. ill appearing female resting in bed in NAD Eyes: PERRL, normal sclera ENMT: Nares patent w/o discharge, orophaynx clear, dentition normal, ears w/o discharge/lesions/ulcers Neck: Supple, trachea midline Cardiovascular: RRR, +S1, S2, no m/g/r, equal pulses throughout Respiratory: scattered rhonchi, scattered wheeze, increased WOB GI: BS+, NDNT, no masses noted, no organomegaly noted MSK: No e/c/c Neuro: A&O x 3, other than HoH, no focal deficits Psyc: Appropriate interaction and affect, calm/cooperative  Data Reviewed:  Results for orders placed or performed during the  hospital encounter of 12/23/21 (from the past 24 hour(s))  CBC with Differential     Status: Abnormal   Collection Time: 12/23/21 10:10 AM  Result Value Ref Range   WBC 8.4 4.0 - 10.5 K/uL   RBC 4.78 3.87 - 5.11 MIL/uL   Hemoglobin 13.2 12.0 - 15.0 g/dL   HCT 41.5 36.0 - 46.0 %   MCV 86.8 80.0 - 100.0 fL   MCH 27.6 26.0 - 34.0 pg   MCHC 31.8 30.0 - 36.0 g/dL   RDW 16.8 (H) 11.5 - 15.5 %   Platelets 447 (H) 150 - 400 K/uL   nRBC 0.0 0.0 - 0.2 %   Neutrophils Relative % 72 %   Neutro Abs 6.1 1.7 - 7.7 K/uL   Lymphocytes Relative 13 %   Lymphs Abs 1.1 0.7 - 4.0 K/uL   Monocytes Relative 12 %   Monocytes Absolute 1.0 0.1 - 1.0 K/uL   Eosinophils Relative 1 %   Eosinophils Absolute 0.1 0.0 - 0.5 K/uL   Basophils Relative 1 %   Basophils Absolute 0.1 0.0 - 0.1 K/uL   Immature Granulocytes 1 %   Abs Immature Granulocytes 0.09 (H) 0.00 - 0.07 K/uL  Basic metabolic panel     Status: Abnormal   Collection Time: 12/23/21 10:10 AM  Result Value Ref Range   Sodium 136 135 - 145 mmol/L   Potassium 4.2 3.5 - 5.1 mmol/L   Chloride 103 98 - 111 mmol/L   CO2 26 22 - 32 mmol/L   Glucose, Bld 89 70 - 99 mg/dL   BUN 14 8 - 23 mg/dL   Creatinine, Ser 0.41 (L) 0.44 - 1.00 mg/dL   Calcium 7.5 (L) 8.9 - 10.3 mg/dL   GFR, Estimated >60 >60 mL/min   Anion gap 7 5 - 15  Troponin I (High Sensitivity)     Status: None   Collection Time: 12/23/21 10:10 AM  Result Value Ref Range   Troponin I (High Sensitivity) 10 <18 ng/L  Blood gas, venous (at [facility] and [facility], not at [facility])     Status: Abnormal   Collection Time: 12/23/21 10:50 AM  Result Value Ref Range   pH, Ven 7.39 7.25 - 7.43   pCO2, Ven 50 44 - 60 mmHg   pO2, Ven 37 32 - 45 mmHg   Bicarbonate 30.3 (H) 20.0 - 28.0 mmol/L   Acid-Base Excess 4.2 (H) 0.0 - 2.0 mmol/L   O2 Saturation 59.2 %   Patient temperature 37.0   Brain natriuretic peptide     Status: None   Collection Time: 12/23/21 10:50 AM  Result Value Ref Range   B Natriuretic Peptide  98.5 0.0 - 100.0 pg/mL   CXR: 1. Moderate to large right pleural effusion which appears laterally loculated. Suggest chest CT. 2. Pulmonary fibrosis  Assessment and Plan: Acute respiratory failure w/ hypoxia Right pleural effusion PNA     - admit  to inpt, tele     - continue vanc, cefepime     - wean O2 as able     - nebs     - pulm consulted d/t loculated effusion, they will follow, appreciate assistance  Emphysema     - as above  HTN     - no longer on a home regimen     - normotensive now, monitor  Hx of complex partial seizures     - she does not take a home regimen     - continue outpt follow up  Advance Care Planning:   Code Status: FULL  Consults: Pulmonology  Family Communication: w/ dtr at bedside  Severity of Illness: The appropriate patient status for this patient is INPATIENT. Inpatient status is judged to be reasonable and necessary in order to provide the required intensity of service to ensure the patient's safety. The patient's presenting symptoms, physical exam findings, and initial radiographic and laboratory data in the context of their chronic comorbidities is felt to place them at high risk for further clinical deterioration. Furthermore, it is not anticipated that the patient will be medically stable for discharge from the hospital within 2 midnights of admission.   * I certify that at the point of admission it is my clinical judgment that the patient will require inpatient hospital care spanning beyond 2 midnights from the point of admission due to high intensity of service, high risk for further deterioration and high frequency of surveillance required.*  Time spent in coordination of this H&P: 75 minutes  Author: Jonnie Finner, DO 12/23/2021 12:30 PM  For on call review [email or web address].

## 2021-12-23 NOTE — ED Provider Notes (Signed)
[facility] DEPT Provider Note   CSN: [id number] Arrival date & time: 12/23/21  0934     History  Chief Complaint  Patient presents with   Weakness   Shortness of Breath    Melanie Walker is a 88 y.o. female.  With PMH of lung cancer not on active chemotherapy or radiation, COPD, TBI, HTN, seizures who presents with generalized weakness and shortness of breath.  Patient lives at home with 24-hour visiting home health care but they are here today because she has become progressively more weak.  She is unable to get out of bed without assistance from multiple people.  Typically she is walking around with a walker.  Also, she has been complaining of worsening shortness of breath at rest and on ambulation.  She was recently treated for a COPD exacerbation with steroids and antibiotics which she is currently completed about a week ago and they felt like she got better at the time but now is much worse.  She has had no recent fevers, no productive cough, no vomiting, no diarrhea.  She is still eating and drinking but less than usual. Denies CP or abdominal pain.   Weakness Associated symptoms: shortness of breath   Shortness of Breath      Home Medications Prior to Admission medications   Medication Sig Start Date End Date Taking? Authorizing Provider  amLODipine (NORVASC) 5 MG tablet Take 5 mg by mouth daily. 06/20/18   [provider]  cetirizine (ZYRTEC ALLERGY) 10 MG tablet Take 1 tablet (10 mg total) by mouth daily. 06/12/20   Blanchie Dessert, MD  levofloxacin (LEVAQUIN) 500 MG tablet Take 500 mg by mouth daily. 12/11/21   [provider]  melatonin 10 MG TABS Take 10 mg by mouth at bedtime as needed. 07/01/20   Little Ishikawa, MD  Multiple Vitamins-Minerals (SENTRY SENIOR PO) Take 1 tablet by mouth daily.    [provider]  predniSONE (DELTASONE) 20 MG tablet Take 20 mg by mouth daily. 12/11/21   [provider]       Allergies    Biaxin [clarithromycin], Boniva [ibandronic acid], Ceftin [cefuroxime axetil], Dilantin [phenytoin sodium extended], Fosamax [alendronate sodium], Guaifenesin & derivatives, Imitrex [sumatriptan], Keflex [cephalexin], Macrobid [nitrofurantoin macrocrystal], Montelukast, Other, Penicillins, Premarin [conjugated estrogens], Sulfa antibiotics, Hctz [hydrochlorothiazide], and Latex    Review of Systems   Review of Systems  Respiratory:  Positive for shortness of breath.   Neurological:  Positive for weakness.    Physical Exam Updated Vital Signs BP (!) 140/67   Pulse 82   Temp 98 F (36.7 C) (Oral)   Resp 20   SpO2 100%  Physical Exam Constitutional: Alert and following commands, fatigued in appearance Eyes: Conjunctivae are normal. ENT      Head: Normocephalic and atraumatic.      Nose: No congestion.      Mouth/Throat: Mucous membranes are moist.      Neck: No stridor. Cardiovascular: S1, S2, extremities are warm and dry, regular rate, equal radial pulses Respiratory: Normal respiratory effort.  Decreased breath sounds on the right lower lung to midlung, O2 sat low 80s on room air improved to low to mid 90s on 2 L nasal cannula  gastrointestinal: Soft and nontender. Musculoskeletal: No pitting edema of the lower extremity Neurologic: Normal speech and language.  No facial droop.  Moving extremities x4.  Sensation grossly intact.  No gross focal neurologic deficits are appreciated. Skin: Skin is warm, dry and intact. No  rash noted. Psychiatric: Mood and affect are normal. Speech and behavior are normal.  ED Results / Procedures / Treatments   Labs (all labs ordered are listed, but only abnormal results are displayed) Labs Reviewed  CBC WITH DIFFERENTIAL/PLATELET - Abnormal; Notable for the following components:      Result Value   RDW 16.8 (*)    Platelets 447 (*)    Abs Immature Granulocytes 0.09 (*)    All other components within normal limits  BASIC  METABOLIC PANEL - Abnormal; Notable for the following components:   Creatinine, Ser 0.41 (*)    Calcium 7.5 (*)    All other components within normal limits  BLOOD GAS, VENOUS - Abnormal; Notable for the following components:   Bicarbonate 30.3 (*)    Acid-Base Excess 4.2 (*)    All other components within normal limits  CULTURE, BLOOD (ROUTINE X 2)  CULTURE, BLOOD (ROUTINE X 2)  BRAIN NATRIURETIC PEPTIDE  CALCIUM, IONIZED  TROPONIN I (HIGH SENSITIVITY)  TROPONIN I (HIGH SENSITIVITY)    EKG EKG Interpretation  Date/Time:  Tuesday December 23 2021 10:48:26 EDT Ventricular Rate:  79 PR Interval:  127 QRS Duration: 122 QT Interval:  363 QTC Calculation: 417 R Axis:   76 Text Interpretation: Sinus or ectopic atrial rhythm Atrial premature complexes Right bundle branch block No acute changes nonspecific t wave inversions V1-V3 anterior leads Confirmed by Georgina Snell [phone]) on 12/23/2021 11:25:48 AM  Radiology DG Chest Port 1 View  Result Date: 12/23/2021 CLINICAL DATA:  Generalized weakness, worse for the last 2 days EXAM: PORTABLE CHEST 1 VIEW COMPARISON:  06/20/2020 FINDINGS: Chronic lung disease with coarse interstitial opacities. There is pulmonary fibrosis by prior CT. Moderate right pleural effusion which appears laterally loculated. Normal heart size. Unremarkable aortic and hilar contours. IMPRESSION: 1. Moderate to large right pleural effusion which appears laterally loculated. Suggest chest CT. 2. Pulmonary fibrosis. Electronically Signed   By: Jorje Guild M.D.   On: 12/23/2021 10:32    Procedures Procedures  Remain on constant cardiac monitoring, regular rate intermittent PACs  Medications Ordered in ED Medications  cefTRIAXone (ROCEPHIN) 1 g in sodium chloride 0.9 % 100 mL IVPB (1 g Intravenous New Bag/Given 12/23/21 1205)  metroNIDAZOLE (FLAGYL) IVPB 500 mg (500 mg Intravenous New Bag/Given 12/23/21 1204)  sodium chloride (PF) 0.9 % injection (has no  administration in time range)  iohexol (OMNIPAQUE) 300 MG/ML solution 100 mL (100 mLs Intravenous Contrast Given 12/23/21 1231)    ED Course/ Medical Decision Making/ A&P Clinical Course as of 12/23/21 1250  Tue Dec 23, 2021  1249 Discussed case with hospitalist Dr. Marylyn Ishihara who will be down to evaluate the patient put in orders for admission.  Will consult pulmonology for hospitalist request. [VB]    Clinical Course User Index [VB] Elgie Congo, MD                           Medical Decision Making  KEYLY BALDONADO is a 88 y.o. female.  With PMH of lung cancer not on active chemotherapy or radiation, COPD, TBI, HTN, seizures who presents with generalized weakness and shortness of breath.    Patient presented hypoxic to the low 80s started on nasal cannula 2 L with improvement of oxygenation.  She had decreased breath sounds on the right lower lung.  A chest x-ray was obtained which showed a loculated effusion in the setting of previous CT scan last year that showed  evidence of pneumonia versus possible underlying bronchogenic cancer or mass.  Based on her presentation, concern for possible parapneumonic effusion either bacterial/infectious versus cancerous.  There is no pneumothorax.  She has no pitting edema of the lower extremities or findings suggestive of underlying heart failure.  No previous echocardiograms.  No wheezing on exam, not consistent with COPD exacerbation.  VBG obtained with normal pH 7.39 and normal PCO2 50.  No hypercarbia.  Since she has a loculated effusion, will cover for possible infection which may be masked by previous antibiotic course that she took and start Rocephin and Flagyl and obtain blood cultures.  Her Lemley blood cell count was normal 8.4.  However she will require further work-up and management and likely diagnostic thoracentesis.  I have also ordered CT chest with contrast for further information.  We will page hospitalist for admission for further work-up and  management.    Amount and/or Complexity of Data Reviewed Labs: ordered. Radiology: ordered.  Risk Prescription drug management. Decision regarding hospitalization.    Final Clinical Impression(s) / ED Diagnoses Final diagnoses:  Weakness  Acute respiratory failure with hypoxia (HCC)  Pleural effusion    Rx / DC Orders ED Discharge Orders     None         Elgie Congo, MD 12/23/21 1250

## 2021-12-23 NOTE — Telephone Encounter (Signed)
Left a voicemail with Dr. Carlyle Lipa office requesting an order for a hospice consult per daughter Terri's request. Left my contact information for return call.

## 2021-12-23 NOTE — ED Notes (Signed)
Unable to obtain second set of cultures prior to starting antibiotics.

## 2021-12-24 DIAGNOSIS — J449 Chronic obstructive pulmonary disease, unspecified: Secondary | ICD-10-CM

## 2021-12-24 DIAGNOSIS — R918 Other nonspecific abnormal finding of lung field: Secondary | ICD-10-CM | POA: Diagnosis not present

## 2021-12-24 DIAGNOSIS — Z7189 Other specified counseling: Secondary | ICD-10-CM | POA: Diagnosis not present

## 2021-12-24 DIAGNOSIS — J439 Emphysema, unspecified: Secondary | ICD-10-CM

## 2021-12-24 DIAGNOSIS — J9601 Acute respiratory failure with hypoxia: Secondary | ICD-10-CM | POA: Diagnosis not present

## 2021-12-24 DIAGNOSIS — Z515 Encounter for palliative care: Secondary | ICD-10-CM

## 2021-12-24 DIAGNOSIS — R5601 Complex febrile convulsions: Secondary | ICD-10-CM

## 2021-12-24 DIAGNOSIS — E43 Unspecified severe protein-calorie malnutrition: Secondary | ICD-10-CM | POA: Insufficient documentation

## 2021-12-24 DIAGNOSIS — I10 Essential (primary) hypertension: Secondary | ICD-10-CM

## 2021-12-24 LAB — CBC
HCT: 38.6 % (ref 36.0–46.0)
Hemoglobin: 12.1 g/dL (ref 12.0–15.0)
MCH: 27.6 pg (ref 26.0–34.0)
MCHC: 31.3 g/dL (ref 30.0–36.0)
MCV: 87.9 fL (ref 80.0–100.0)
Platelets: 431 10*3/uL — ABNORMAL HIGH (ref 150–400)
RBC: 4.39 MIL/uL (ref 3.87–5.11)
RDW: 16.9 % — ABNORMAL HIGH (ref 11.5–15.5)
WBC: 8.3 10*3/uL (ref 4.0–10.5)
nRBC: 0 % (ref 0.0–0.2)

## 2021-12-24 LAB — COMPREHENSIVE METABOLIC PANEL
ALT: 16 U/L (ref 0–44)
AST: 21 U/L (ref 15–41)
Albumin: 2 g/dL — ABNORMAL LOW (ref 3.5–5.0)
Alkaline Phosphatase: 122 U/L (ref 38–126)
Anion gap: 4 — ABNORMAL LOW (ref 5–15)
BUN: 13 mg/dL (ref 8–23)
CO2: 25 mmol/L (ref 22–32)
Calcium: 7.5 mg/dL — ABNORMAL LOW (ref 8.9–10.3)
Chloride: 108 mmol/L (ref 98–111)
Creatinine, Ser: 0.45 mg/dL (ref 0.44–1.00)
GFR, Estimated: >60 mL/min (ref >60)
Glucose, Bld: 89 mg/dL (ref 70–99)
Potassium: 3.9 mmol/L (ref 3.5–5.1)
Sodium: 137 mmol/L (ref 135–145)
Total Bilirubin: 0.6 mg/dL (ref 0.3–1.2)
Total Protein: 4.5 g/dL — ABNORMAL LOW (ref 6.5–8.1)

## 2021-12-24 LAB — CALCIUM, IONIZED: Calcium, Ionized, Serum: 4.7 mg/dL (ref 4.5–5.6)

## 2021-12-24 MED ORDER — ORAL CARE MOUTH RINSE
15.0000 mL | OROMUCOSAL | Status: DC
  Administered 2021-12-24: 15 mL via OROMUCOSAL

## 2021-12-24 MED ORDER — ORAL CARE MOUTH RINSE: 15.0000 mL | OROMUCOSAL | Status: DC | PRN

## 2021-12-24 MED ORDER — FLUTICASONE PROPIONATE 50 MCG/ACT NA SUSP
2.0000 | Freq: Every day | NASAL | Status: DC
  Administered 2021-12-24 – 2021-12-29 (×6): 2 via NASAL
  Filled 2021-12-24: qty 16

## 2021-12-24 MED ORDER — ADULT MULTIVITAMIN W/MINERALS CH
1.0000 | ORAL_TABLET | Freq: Every day | ORAL | Status: DC
  Administered 2021-12-24 – 2021-12-29 (×6): 1 via ORAL
  Filled 2021-12-24 (×6): qty 1

## 2021-12-24 MED ORDER — FLUTICASONE PROPIONATE 50 MCG/ACT NA SUSP
2.0000 | Freq: Every day | NASAL | Status: DC
  Filled 2021-12-24: qty 16

## 2021-12-24 MED ORDER — ENSURE ENLIVE PO LIQD
237.0000 mL | Freq: Two times a day (BID) | ORAL | Status: DC
  Administered 2021-12-24 – 2021-12-25 (×2): 237 mL via ORAL

## 2021-12-24 NOTE — Hospital Course (Addendum)
88 y.o. female with medical history significant of HTN, complex partial seizures, emphysema. Presented with weakness and shortness of breath despite multiple rounds of antibiotics and steroids over the last few weeks due to increased cough and presumed COPD exacerbation. Seen in the ED,labs showed WBC count 8.4 stable renal function troponin negative PCO2 59 VBG chest x-ray moderate to large right pleural effusion> CT enlargement of right lower lobe lung mass since 06/20/2020 with development of pleural-based pulmonary nodules highly suspicious for metastatic disease, moderate right pleural effusion, bilateral interstitial opacities similar to prior, aortic atherosclerosis, pulmonary artery enlargement.Pulmonary was consulted-discussed thoracentesis and daughter wanted some time to decide.Palliative care has been consulted to discuss goals of care and potentially for hospice.  Declined recommendation for thoracentesis.

## 2021-12-24 NOTE — Consult Note (Signed)
                                                                                     Consultation Note Date: 12/24/2021   Patient Name: Melanie Walker  DOB: 1931-02-10  MRN: [id number]  Age / Sex: 88 y.o., female  PCP: Melanie Manes, MD Referring Physician: Antonieta Pert, MD  Reason for Consultation: GOC   HPI/Patient Profile: 88 y.o. female  with past medical history of HTN, complex partial seizures, emphysema, pulmonary nodule with suspected cancer- workup was not pursued, admitted on 12/23/2021 with findings of postobstructive pneumonia, worsening lung mass, and large R pleural effusion. She is being treated with antibiotics, considering thoracentesis. Palliative medicine consulted for goals of care.    Primary Decision Maker NEXT OF KIN - daughter Melanie Walker  Discussion: Chart reviewed including labs, progress notes from this and previous admission, imaging.  Patient is awake, hard of hearing, complains of feeling terrible all over.  No family at bedside.  She has been seen by outpatient Palliative frequently and her daughter called [facility] and requested Hospice on 9/19 prior to her admission.    SUMMARY OF RECOMMENDATIONS -Called daughter and left message -Melanie Walker is frail 88 yo lady who appears chronically and acutely ill- given that daughter requested hospice the day of her admission will discuss options for comfort, potentially she could be eligible for inpatient hospice if comfort path is chosen- also recommend discussing DNR    Code Status/Advance Care Planning: Full code   Prognosis:   Unable to determine  Discharge Planning: To Be Determined  Primary Diagnoses: Present on Admission:  Acute respiratory failure with hypoxia (HCC)  Complex partial seizure ([place])  COPD with emphysema (HCC)  Essential hypertension   Review of Systems  Physical Exam  Vital Signs: BP 103/63 (BP Location: Left Arm)   Pulse 90   Temp 98 F (36.7 C) (Oral)   Resp 20   Ht 4' 11.5" (1.511 m)    Wt 38.6 kg   SpO2 100%   BMI 16.88 kg/m  Pain Scale: 0-10   Pain Score: 0-No pain   SpO2: SpO2: 100 % O2 Device:SpO2: 100 % O2 Flow Rate: .O2 Flow Rate (L/min): 2 L/min  IO: Intake/output summary:  Intake/Output Summary (Last 24 hours) at 12/24/2021 1652 Last data filed at 12/24/2021 1410 Gross per 24 hour  Intake 731 ml  Output 30 ml  Net 701 ml    LBM: Last BM Date : 12/23/21 Baseline Weight: Weight: 38.6 kg Most recent weight: Weight: 38.6 kg       Thank you for this consult. Palliative medicine will continue to follow and assist as needed.  Time Total: 60 minutes Greater than 50%  of this time was spent counseling and coordinating care related to the above assessment and plan.  Signed by: Melanie Walker, AGNP-C Palliative Medicine    Please contact Palliative Medicine Team phone at [phone] for questions and concerns.  For individual provider: See Shea Evans

## 2021-12-24 NOTE — Progress Notes (Signed)
Initial Nutrition Assessment  DOCUMENTATION CODES:   Severe malnutrition in context of chronic illness, Underweight  INTERVENTION:  Encourage adequate PO intake Ensure Enlive po BID, each supplement provides 350 kcal and 20 grams of protein. MVI with minerals daily  NUTRITION DIAGNOSIS:   Severe Malnutrition related to chronic illness (empysema, advanced age) as evidenced by moderate fat depletion, severe fat depletion, severe muscle depletion  GOAL:   Patient will meet greater than or equal to 90% of their needs  MONITOR:   PO intake, Supplement acceptance, Labs, Weight trends  REASON FOR ASSESSMENT:   Consult Assessment of nutrition requirement/status  ASSESSMENT:   Pt admitted from home with weakness and SOB r/t acute respiratory failure w/hypoxia and R pleural effusion . PMH significant for HTN, complex partial seizures, emphysema.  Considering thoracentesis. Pending palliative consult for GOC.  Spoke with pt at bedside. She was needing to use the restroom. Pt had caregiver at beside who provided most history. She states that PTA she was eating a good breakfast and something small for lunch and dinner. Her PO intake has not changed. She has not had difficulty chewing or swallowing foods.   Her caregiver reports that she ate most of her breakfast with the exception of a couple bites and her banana which she may have for a snack later today.   Pt denies changes in her weight but her caregiver reports that she has had a lot of weight loss but did not quantify. Unfortunately there is limited documentation of weight history within the last year. Current weight noted to be 38.6 kg.   Medications: IV abx  Labs: anion gap 4  UOP: 71ml x12 hours I/O's: +487ml since admission  NUTRITION - FOCUSED PHYSICAL EXAM:  Flowsheet Row Most Recent Value  Orbital Region Moderate depletion  Upper Arm Region Severe depletion  Thoracic and Lumbar Region Severe depletion  Buccal  Region Moderate depletion  Temple Region Moderate depletion  Clavicle Bone Region Moderate depletion  Clavicle and Acromion Bone Region Severe depletion  Scapular Bone Region Severe depletion  Dorsal Hand Severe depletion  Patellar Region Severe depletion  Anterior Thigh Region Severe depletion  Posterior Calf Region Severe depletion  Edema (RD Assessment) None  Hair Reviewed  Eyes Reviewed  Mouth Reviewed  Skin Reviewed  Nails Reviewed      Diet Order:   Diet Order             Diet regular Room service appropriate? Yes; Fluid consistency: Thin  Diet effective now                   EDUCATION NEEDS:   No education needs have been identified at this time  Skin:  Skin Assessment: Reviewed RN Assessment  Last BM:  9/19 (type 3)  Height:   Ht Readings from Last 1 Encounters:  12/23/21 4' 11.5" (1.511 m)    Weight:   Wt Readings from Last 1 Encounters:  12/23/21 38.6 kg   BMI:  Body mass index is 16.88 kg/m.  Estimated Nutritional Needs:   Kcal:  1000-1200  Protein:  50-65g  Fluid:  >/=1.2L  Clayborne Dana, RDN, LDN Clinical Nutrition

## 2021-12-24 NOTE — Progress Notes (Signed)
Palliative-   Consult received- chart reviewed. Called patient's daughter [name]- left message requesting return call.   Mariana Kaufman, AGNP-C Palliative Medicine  No charge

## 2021-12-24 NOTE — Progress Notes (Signed)
[facility] Liaison Note  [facility] will continue to follow for discharge planning needs. Please call with any questions or concerns. Thank you  Roselee Nova, [facility] Liaison [phone]

## 2021-12-24 NOTE — Progress Notes (Signed)
PROGRESS NOTE Melanie Walker  IRJ:[id number] DOB: 20-Oct-1930 DOA: 12/23/2021 PCP: Lajean Manes, MD   Brief Narrative/Hospital Course: 88 y.o. female with medical history significant of HTN, complex partial seizures, emphysema. Presented with weakness and shortness of breath despite multiple rounds of antibiotics and steroids over the last few weeks due to increased cough and presumed COPD exacerbation. Seen in the ED labs showed WBC count 8.4 stable renal function troponin negative PCO2 59 VBG chest x-ray moderate to large right pleural effusion> CT enlargement of right lower lobe lung mass since 06/20/2020 with development of pleural-based pulmonary nodules highly suspicious for metastatic disease, moderate right pleural effusion, bilateral interstitial opacities similar to prior, aortic atherosclerosis, pulmonary artery enlargement.  Pulmonary was consulted.  Subjective: Seen this am Patient is resting comfortably on nasal cannula, caregiver at the bedside. She complains of back pain but no new complaints   Assessment and Plan: Principal Problem:   Acute respiratory failure with hypoxia (HCC) Active Problems:   COPD with emphysema (HCC)   Complex partial seizure (HCC)   Essential hypertension   PNA (pneumonia)  Right pleural effusion Spectated RLL neoplasm Acute hypoxic respiratory insufficiency Postobstructive right lower lobe pneumonia: Patient had RLL mass PET/CT not pursued in the past that she unfortunately developed COVID-19 pneumonia shortly after her visit with Dr. Valeta Harms.  "Continue on empiric antibiotics for postobstructive pneumonia, bronchodilators pulmonary hygiene.  Pulmonary following closely possible diagnosis/therapeutic thoracentesis discussed- Daughter this am informed she has not made decision yet.Palliative care has been consulted as well.  GOC: full code.Consulted palliative care.  Followed by [facility] outpatient hospice team.  History of complex partial seizure not on  AED Emphysema: Continue respiratory support Essential hypertension no longer on meds BP stable Moderate protein malnutrition: Consult dietitian.Body mass index is 16.88 kg/m. Daughter has refused further swab for MRSA  DVT prophylaxis: heparin injection 5,000 Units Start: 12/23/21 1800 Code Status:   Code Status: Full Code Family Communication: plan of care discussed with patient/patient's daughter over the phone Patient status is: Inpatient because of further work-up of pleural effusion/lung mass Level of care: Telemetry   Dispo: The patient is from: home with caregiver            Anticipated disposition: TBD  Objective: Vitals last 24 hrs: Vitals:   12/23/21 2059 12/24/21 0131 12/24/21 0451 12/24/21 0748  BP: (!) 122/51 (!) 116/55 (!) 119/54   Pulse: 80 83 85   Resp: 19 18 17  (!) 24  Temp: 99 F (37.2 C) 98.2 F (36.8 C) (!) 97.5 F (36.4 C)   TempSrc:   Oral   SpO2: 95% 97% 93% 94%  Weight:      Height:       Weight change:   Physical Examination: General exam: alert awake, ill, frail-appearing and thin  HEENT:Oral mucosa moist, Ear/Nose WNL grossly, dentition normal. Respiratory system: bilaterally diminished BS, no use of accessory muscle Cardiovascular system: S1 & S2 +, No JVD. Gastrointestinal system: Abdomen soft,NT,ND, BS+ Nervous System:Alert, awake, moving extremities and grossly nonfocal Extremities: LE edema neg,distal peripheral pulses palpable.  Skin: No rashes,no icterus. MSK: Normal muscle bulk,tone, power  Medications reviewed:  Scheduled Meds:  arformoterol  15 mcg Nebulization BID   heparin injection (subcutaneous)  5,000 Units Subcutaneous Q8H   mouth rinse  15 mL Mouth Rinse 4 times per day   revefenacin  175 mcg Nebulization Daily  Continuous Infusions:  ceFEPime (MAXIPIME) IV 2 g (12/23/21 1805)   vancomycin 750 mg (12/23/21 1835)  Diet Order             Diet regular Room service appropriate? Yes; Fluid consistency: Thin  Diet  effective now                    Intake/Output Summary (Last 24 hours) at 12/24/2021 1054 Last data filed at 12/24/2021 0900 Gross per 24 hour  Intake 491 ml  Output 30 ml  Net 461 ml   Net IO Since Admission: 461 mL [12/24/21 1054]  Wt Readings from Last 3 Encounters:  12/23/21 38.6 kg  08/22/21 38.4 kg  09/10/20 44 kg     Unresulted Labs (From admission, onward)     Start     Ordered   12/23/21 1428  MRSA Next Gen by PCR, Nasal  (MRSA Screening)  ONCE - URGENT,   URGENT        12/23/21 1427   12/23/21 1414  Calcium, ionized  Once,   R        12/23/21 1414   12/23/21 1405  MRSA culture  Once,   R        12/23/21 1404   12/23/21 1404  Expectorated Sputum Assessment w Gram Stain, Rflx to Resp Cult  Once,   R        12/23/21 1404          Data Reviewed: I have personally reviewed following labs and imaging studies CBC: Recent Labs  Lab 12/23/21 1010 12/24/21 0808  WBC 8.4 8.3  NEUTROABS 6.1  --   HGB 13.2 12.1  HCT 41.5 38.6  MCV 86.8 87.9  PLT 447* 119*   Basic Metabolic Panel: Recent Labs  Lab 12/23/21 1010 12/24/21 0808  NA 136 137  K 4.2 3.9  CL 103 108  CO2 26 25  GLUCOSE 89 89  BUN 14 13  CREATININE 0.41* 0.45  CALCIUM 7.5* 7.5*   GFR: Estimated Creatinine Clearance: 29.1 mL/min (by C-G formula based on SCr of 0.45 mg/dL). Liver Function Tests: Antimicrobials: Anti-infectives (From admission, onward)    Start     Dose/Rate Route Frequency Ordered Stop   12/23/21 1600  ceFEPIme (MAXIPIME) 2 g in sodium chloride 0.9 % 100 mL IVPB        2 g 200 mL/hr over 30 Minutes Intravenous Every 24 hours 12/23/21 1504     12/23/21 1515  vancomycin (VANCOREADY) IVPB 750 mg/150 mL        750 mg 150 mL/hr over 60 Minutes Intravenous Every 48 hours 12/23/21 1504     12/23/21 1045  cefTRIAXone (ROCEPHIN) 1 g in sodium chloride 0.9 % 100 mL IVPB  Status:  Discontinued        1 g 200 mL/hr over 30 Minutes Intravenous Every 24 hours 12/23/21 1043 12/23/21  1505   12/23/21 1045  metroNIDAZOLE (FLAGYL) IVPB 500 mg        500 mg 100 mL/hr over 60 Minutes Intravenous  Once 12/23/21 1043 12/23/21 1304      Culture/Microbiology    Component Value Date/Time   SDES  12/23/2021 1616    BLOOD BLOOD LEFT ARM Performed at [facility], [place] [address], [place], [place] [address]    SPECREQUEST  12/23/2021 1616    BOTTLES DRAWN AEROBIC ONLY Blood Culture adequate volume Performed at [facility] [address], [place], [place] [address]    CULT  12/23/2021 1616    NO GROWTH < 12 HOURS Performed at [facility]  Lab, [address], [place], [place] [address]    REPTSTATUS PENDING 12/23/2021 1616   Radiology Studies: CT Chest W Contrast  Result Date: 12/23/2021 CLINICAL DATA:  Weakness. Shortness of breath. Right-sided lung cancer. Hypoxia. * Tracking Code: BO * EXAM: CT CHEST WITH CONTRAST TECHNIQUE: Multidetector CT imaging of the chest was performed during intravenous contrast administration. RADIATION DOSE REDUCTION: This exam was performed according to the departmental dose-optimization program which includes automated exposure control, adjustment of the mA and/or kV according to patient size and/or use of iterative reconstruction technique. CONTRAST:  152mL OMNIPAQUE IOHEXOL 300 MG/ML  SOLN COMPARISON:  Plain film of earlier today.  Chest CT 06/20/2020 FINDINGS: Moderately motion and arm position degraded exam. Cardiovascular: Aortic atherosclerosis. Normal heart size, with small volume anterior pericardial fluid. No central pulmonary embolism, on this non-dedicated study. Pulmonary artery enlargement, outflow tract 3.2 cm. Mediastinum/Nodes: No mediastinal or hilar adenopathy. Lungs/Pleura: Moderate right pleural effusion. Moderate centrilobular emphysema. Right lower lobe endobronchial compression or occlusion including on 74/7, new. Inferolateral right upper lobe 8 mm pulmonary nodule on 63/7, new since the  prior CT. Right lower lobe lung mass measures 5.2 x 5.0 cm on 78/2 versus 3.3 x 2.5 cm on 06/20/2020. Left upper lobe pleural-based nodule measures 1.0 cm on 37/7. Right worse than left ground-glass opacity and interstitial thickening are relatively similar in severity to 06/20/2020. Upper Abdomen: Multiple small gallstones. Probable hepatic steatosis. Normal imaged portions of the spleen, stomach, pancreas, adrenal glands, kidneys. Musculoskeletal: Osteopenia. IMPRESSION: 1. Moderately degraded exam secondary to motion and patient arm position. 2. Enlargement of right lower lobe lung mass since 06/20/2020. Development of pleural-based pulmonary nodules, highly suspicious for metastatic disease. 3. Moderate right pleural effusion. 4. Bilateral interstitial opacities which are relatively similar to 06/20/2020. Considerations include postinfectious/inflammatory scarring, recurrent infection, including atypical etiologies, and less likely pulmonary edema. Lymphangitic tumor spread is felt less likely given relative stability over 18 months. 5. No thoracic adenopathy. 6. Aortic atherosclerosis (ICD10-I70.0) and emphysema (ICD10-J43.9). 7. Pulmonary artery enlargement suggests pulmonary arterial hypertension. 8. Cholelithiasis Electronically Signed   By: Abigail Miyamoto M.D.   On: 12/23/2021 16:04   DG Chest Port 1 View  Result Date: 12/23/2021 CLINICAL DATA:  Generalized weakness, worse for the last 2 days EXAM: PORTABLE CHEST 1 VIEW COMPARISON:  06/20/2020 FINDINGS: Chronic lung disease with coarse interstitial opacities. There is pulmonary fibrosis by prior CT. Moderate right pleural effusion which appears laterally loculated. Normal heart size. Unremarkable aortic and hilar contours. IMPRESSION: 1. Moderate to large right pleural effusion which appears laterally loculated. Suggest chest CT. 2. Pulmonary fibrosis. Electronically Signed   By: Jorje Guild M.D.   On: 12/23/2021 10:32     LOS: 1 day   Antonieta Pert,  MD [facility]  12/24/2021, 10:54 AM

## 2021-12-25 DIAGNOSIS — J9601 Acute respiratory failure with hypoxia: Secondary | ICD-10-CM | POA: Diagnosis not present

## 2021-12-25 DIAGNOSIS — E43 Unspecified severe protein-calorie malnutrition: Secondary | ICD-10-CM

## 2021-12-25 DIAGNOSIS — J189 Pneumonia, unspecified organism: Secondary | ICD-10-CM

## 2021-12-25 DIAGNOSIS — Z7189 Other specified counseling: Secondary | ICD-10-CM

## 2021-12-25 DIAGNOSIS — R918 Other nonspecific abnormal finding of lung field: Secondary | ICD-10-CM

## 2021-12-25 MED ORDER — BOOST / RESOURCE BREEZE PO LIQD CUSTOM
1.0000 | Freq: Three times a day (TID) | ORAL | Status: DC
  Administered 2021-12-26 – 2021-12-27 (×5): 1 via ORAL

## 2021-12-25 MED ORDER — SACCHAROMYCES BOULARDII 250 MG PO CAPS
250.0000 mg | ORAL_CAPSULE | Freq: Two times a day (BID) | ORAL | Status: DC
  Administered 2021-12-25 – 2021-12-29 (×9): 250 mg via ORAL
  Filled 2021-12-25 (×9): qty 1

## 2021-12-25 NOTE — Progress Notes (Addendum)
Daily Progress Note   Patient Name: Melanie Walker       Date: 12/25/2021 DOB: 1930/08/16  Age: 88 y.o. MRN#: [id number] Attending Physician: Antonieta Pert, MD Primary Care Physician: Lajean Manes, MD Admit Date: 12/23/2021  Reason for Consultation/Follow-up: Establishing goals of care  Patient Profile/HPI: 88 y.o. female  with past medical history of HTN, complex partial seizures, emphysema, pulmonary nodule with suspected cancer- workup was not pursued, admitted on 12/23/2021 with findings of postobstructive pneumonia, worsening lung mass with development of new pulmonary nodules, and large R pleural effusion. She is being treated with antibiotics, considering thoracentesis. Palliative medicine consulted for goals of care.    Subjective: Chart reviewed. Evaluated patient - she appears less uncomfortable today. She is not short of breath. She ate most of her lunch. Having diarrhea- daughter and caregiver attribute this to the Ensure.  More history obtained from daughter and caregiver.  Prior to this admission she was living at home- required assistance with ADL's. Able to ambulate from bed to chair to bathroom. Sleeps more than she's awake. Has lost significant weight.  Discussed current illness and trajectory with Terri. Lung mass that is growing and now with post obstructive pneumonia. We discussed that mass will continue to grow and cause problems and decline towards end of life. We discussed that post obstructive pnuemonia is very difficult to treat and will likely recur given the location of her lung mass.  Options of continued aggressive care vs comfort and hospice were discussed. We discussed decision making regarding thoracentesis- typically done for either therapeutic or diagnostic reasons-  Anahli isn't having shortness of breath and they do not desire further workup or treatment if mass is cancer - so Terri would not like to proceed with thoracentesis. Hospice services and philosophy of care were reviewed. We discussed that goal of hospice is providing support, symptom management when a person has an illness they are likely to die from and no curative treatments are being pursued.  Code status and escalation of care were discussed- Terri shares wishes for full code and escalation of care with ventilation while hospitalized. We discussed the conflict of hospice philosophy of supporting and allowing someone peaceful death and full code status. Terri stated she understood.    Physical Exam Vitals and nursing note reviewed.  Constitutional:  Appearance: She is ill-appearing.     Comments: Frail, cachetic  Pulmonary:     Effort: Pulmonary effort is normal.  Neurological:     Comments: Awake, alert             Vital Signs: BP (!) 140/64 (BP Location: Left Arm)   Pulse 84   Temp 98 F (36.7 C)   Resp 16   Ht 4' 11.5" (1.511 m)   Wt 38.6 kg   SpO2 99%   BMI 16.88 kg/m  SpO2: SpO2: 99 % O2 Device: O2 Device: Nasal Cannula O2 Flow Rate: O2 Flow Rate (L/min): 2 L/min  Intake/output summary:  Intake/Output Summary (Last 24 hours) at 12/25/2021 1408 Last data filed at 12/25/2021 1048 Gross per 24 hour  Intake 834 ml  Output --  Net 834 ml   LBM: Last BM Date : 12/24/21 Baseline Weight: Weight: 38.6 kg Most recent weight: Weight: 38.6 kg       Palliative Assessment/Data: PPS: 30%      Patient Active Problem List   Diagnosis Date Noted   Protein-calorie malnutrition, severe 12/24/2021   Acute respiratory failure with hypoxia (HCC) 12/23/2021   PNA (pneumonia) 12/23/2021   Chronic respiratory failure with hypoxia (HCC) 12/15/2021   Constipation 12/15/2021   Shortness of breath 09/11/2021   Palliative care encounter 09/11/2021   Abnormal urine odor 09/11/2021    Bedbound 09/11/2021   UTI (urinary tract infection)-gram negative 08/22/2021   Acute urinary retention 03/55/9741   Acute metabolic encephalopathy 63/84/5364   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[place]) 06/21/2020   COPD with emphysema ([place]) 06/21/2020   Acute respiratory failure due to COVID-19 ([place]) 06/20/2020   Mild traumatic brain injury ([place]) 03/29/2018   Lumbar radiculopathy 09/30/2017   Paresthesia 02/12/2016   Abnormality of gait 03/12/2015   Risk for falls 01/25/2015   Essential hypertension 01/25/2015   Seizure ([place])    Complex partial seizure ([place]) 03/08/2013   Numbness 03/08/2013   Low back pain 03/08/2013    Palliative Care Assessment & Plan    Assessment/Recommendations/Plan  Patient with lung mass that is worsening and now with postobstructive pneumonia- goals of care are to continue hospitalization and treat what is treatable and then discharge home with hospice- no thoracentesis Full code- hospice will continue to discuss after discharge Change Ensure to Resource clear drink Florastor probiotic for possible diarrhea related to antibiotic Home with hospice at discharge   Code Status: Full code  Prognosis:  < 6 months  Discharge Planning: Home with Hospice  Care plan was discussed with patient's daughterKarna Christmas and care team.  Thank you for allowing the Palliative Medicine Team to assist in the care of this patient.  Total time:  90 minutes      Greater than 50%  of this time was spent counseling and coordinating care related to the above assessment and plan.  Mariana Kaufman, AGNP-C Palliative Medicine   Please contact Palliative Medicine Team phone at [phone] for questions and concerns.

## 2021-12-25 NOTE — Evaluation (Signed)
Physical Therapy One Time Evaluation Patient Details Name: Melanie Walker MRN: [id number] DOB: [birth year] Today's Date: 12/25/2021  History of Present Illness  88 y.o. female with medical history significant of HTN, complex partial seizures, emphysema, Covid 19 pneumonia and presented to ED on 12/23/21 with weakness and shortness of breath despite multiple rounds of antibiotics and steroids over the last few weeks due to increased cough and presumed COPD exacerbation.  Chest x-ray moderate to large right pleural effusion> CT enlargement of right lower lobe lung mass since 06/20/2020 with development of pleural-based pulmonary nodules highly suspicious for metastatic disease, moderate right pleural effusion, bilateral interstitial opacities similar to prior, aortic atherosclerosis, pulmonary artery enlargement.  Pulmonary was consulted-discussed thoracentesis and daughter wanted some time to decide. Palliative care has been consulted to discuss goals of care and potentially for hospice.  Clinical Impression  Patient evaluated by Physical Therapy with no further acute PT needs identified. All education has been completed and the patient has no further questions. Nurse tech reports pt getting up to [facility] multiple times this morning.  Pt's caregiver in room and states pt has typically been in bed and has BSC next to bed.  Pt can ambulate with RW however has not really ambulated in the last few weeks.  Pt adamantly does not wish to get OOB today and encouraged pt to attempt ambulating however she would only sit EOB briefly and the returned to fetal position in bed refusing.  Plans per chart for palliative and likely hospice.  Pt likely needs to conserve her energy for activities she enjoys and ADLs.  Family could try HHPT if pt willing however plan is possibly for hospice upon d/c.  If family or staff is adamant about pt moving, will have mobility specialists check in on pt. PT is signing off. Thank you for this  referral.        Recommendations for follow up therapy are one component of a multi-disciplinary discharge planning process, led by the attending physician.  Recommendations may be updated based on patient status, additional functional criteria and insurance authorization.  Follow Up Recommendations Other (comment) (pt likely would not participate, also should conserve energy for ADLs, likely hospice upon d/c)      Assistance Recommended at Discharge Frequent or constant Supervision/Assistance  Patient can return home with the following  A little help with walking and/or transfers;A little help with bathing/dressing/bathroom;Assistance with cooking/housework;Direct supervision/assist for medications management;Help with stairs or ramp for entrance;Assist for transportation;Direct supervision/assist for financial management    Equipment Recommendations None recommended by PT  Recommendations for Other Services       Functional Status Assessment Patient has had a recent decline in their functional status and demonstrates the ability to make significant improvements in function in a reasonable and predictable amount of time.     Precautions / Restrictions Precautions Precautions: Fall Precaution Comments: currently on oxygen      Mobility  Bed Mobility Overal bed mobility: Needs Assistance Bed Mobility: Supine to Sit, Sit to Supine     Supine to sit: Supervision Sit to supine: Supervision   General bed mobility comments: pt prefers fetal position in bed, able to stretch out in supine and then sit EOB briefly however did NOT want to mobilize so returned to fetal position and demanded her covers/blankets    Transfers                        Ambulation/Gait  Stairs            Wheelchair Mobility    Modified Rankin (Stroke Patients Only)       Balance                                             Pertinent  Vitals/Pain Pain Assessment Pain Assessment: No/denies pain    Home Living Family/patient expects to be discharged to:: Private residence Living Arrangements: Alone Available Help at Discharge: Personal care attendant;Available 24 hours/day Type of Home: House         Home Layout: One level Home Equipment: Conservation officer, nature (2 wheels)      Prior Function Prior Level of Function : Needs assist             Mobility Comments: can ambulate household distances with RW however slowly ADLs Comments: has caregiver to assist as needed     Hand Dominance        Extremity/Trunk Assessment        Lower Extremity Assessment Lower Extremity Assessment: Generalized weakness    Cervical / Trunk Assessment Cervical / Trunk Assessment: Kyphotic  Communication   Communication: HOH  Cognition Arousal/Alertness: Awake/alert Behavior During Therapy: Agitated Overall Cognitive Status: History of cognitive impairments - at baseline                                 General Comments: pt not wanting to mobilize, sometimes yells out answers when questions or requests are not to her liking        General Comments      Exercises     Assessment/Plan    PT Assessment Patient does not need any further PT services  PT Problem List         PT Treatment Interventions      PT Goals (Current goals can be found in the Care Plan section)  Acute Rehab PT Goals PT Goal Formulation: All assessment and education complete, DC therapy    Frequency       Co-evaluation               AM-PAC PT "6 Clicks" Mobility  Outcome Measure Help needed turning from your back to your side while in a flat bed without using bedrails?: A Little Help needed moving from lying on your back to sitting on the side of a flat bed without using bedrails?: A Little Help needed moving to and from a bed to a chair (including a wheelchair)?: A Little Help needed standing up from a chair using  your arms (e.g., wheelchair or bedside chair)?: A Little Help needed to walk in hospital room?: A Lot Help needed climbing 3-5 steps with a railing? : A Lot 6 Click Score: 16    End of Session Equipment Utilized During Treatment: Oxygen Activity Tolerance: Other (comment) (self limiting) Patient left: in bed;with call bell/phone within reach;with family/visitor present Nurse Communication: Mobility status PT Visit Diagnosis: Difficulty in walking, not elsewhere classified (R26.2);Muscle weakness (generalized) (M62.81)    Time: 8413-2440 PT Time Calculation (min) (ACUTE ONLY): 17 min   Charges:   PT Evaluation $PT Eval Low Complexity: 1 Low     Kati PT, DPT Physical Therapist Acute Rehabilitation Services Preferred contact method: Secure Chat Weekend Pager Only: [phone] Office: [place]  [place] 12/25/2021, 12:11 PM

## 2021-12-25 NOTE — Progress Notes (Signed)
PROGRESS NOTE Melanie Walker  HWT:[id number] DOB: 1931-01-27 DOA: 12/23/2021 PCP: Lajean Manes, MD   Brief Narrative/Hospital Course: 88 y.o. female with medical history significant of HTN, complex partial seizures, emphysema. Presented with weakness and shortness of breath despite multiple rounds of antibiotics and steroids over the last few weeks due to increased cough and presumed COPD exacerbation. Seen in the ED labs showed WBC count 8.4 stable renal function troponin negative PCO2 59 VBG chest x-ray moderate to large right pleural effusion> CT enlargement of right lower lobe lung mass since 06/20/2020 with development of pleural-based pulmonary nodules highly suspicious for metastatic disease, moderate right pleural effusion, bilateral interstitial opacities similar to prior, aortic atherosclerosis, pulmonary artery enlargement.  Pulmonary was consulted-discussed thoracentesis and daughter wanted some time to decide.Palliative care has been consulted to discuss goals of care and potentially for hospice.  Subjective: Seen and examined.  Resting comfortably forgetful caregiver at the bedside on 2 L nasal cannula.   Remains in fetal position   Assessment and Plan: Principal Problem:   Acute respiratory failure with hypoxia (HCC) Active Problems:   COPD with emphysema (HCC)   Complex partial seizure (HCC)   Essential hypertension   PNA (pneumonia)   Protein-calorie malnutrition, severe  Right pleural effusion Spectated RLL neoplasm Acute hypoxic respiratory insufficiency Postobstructive right lower lobe pneumonia: Patient had RLL mass PET/CT not pursued in the past that she unfortunately developed COVID-19 pneumonia shortly after her visit with Dr. Valeta Harms.  Seen by pulmonary however diagnostic/therapeutic thoracentesis daughter appears reluctant, looking into palliative route, waiting for palliative meeting.  For now continue empiric antibiotic for postobstructive process, continue  supplemental oxygen supportive care.  GOC: full code.palliative care team to meet soon.Followed by [facility] outpatient hospice team.  History of complex partial seizure not on AED Emphysema: Continue respiratory support Essential hypertension no longer on meds BP stable Severe malnutrition: Augment diet as below. Body mass index is 16.88 kg/m. Nutrition Problem: Severe Malnutrition Etiology: chronic illness (empysema, advanced age) Signs/Symptoms: moderate fat depletion, severe fat depletion, severe muscle depletion Interventions: Ensure Enlive (each supplement provides 350kcal and 20 grams of protein), MVI  DVT prophylaxis: heparin injection 5,000 Units Start: 12/23/21 1800 Code Status:   Code Status: Full Code Family Communication: plan of care discussed with patient/patient's daughter over the phone 9/20  Patient status is: Inpatient because of further work-up of pleural effusion/lung mass Level of care: Telemetry  Dispo: The patient is from: home with caregiver            Anticipated disposition: TBD.  Likely palliative/hospice if agreeable  Objective: Vitals last 24 hrs: Vitals:   12/24/21 2008 12/24/21 2053 12/25/21 0359 12/25/21 0803  BP: (!) 111/49  (!) 117/48   Pulse: 89  90   Resp: 19  17   Temp: 98.9 F (37.2 C)  98.1 F (36.7 C)   TempSrc:      SpO2: 94% 94% 98% 99%  Weight:      Height:       Weight change:   Physical Examination: General exam: AA, forgetful thin frail cachectic, on cannula HEENT:Oral mucosa moist, Ear/Nose WNL grossly, dentition normal. Respiratory system: bilaterally clear, no use of accessory muscle Cardiovascular system: S1 & S2 +, No JVD,. Gastrointestinal system: Abdomen soft,NT,ND,BS+ Nervous System:Alert, awake, moving extremities and grossly nonfocal Extremities: LE ankle edema neg, distal peripheral pulses palpable.  Skin: No rashes,no icterus. MSK: in fetal position  Medications reviewed:  Scheduled Meds:  arformoterol  15 mcg  Nebulization BID  feeding supplement  237 mL Oral BID BM   fluticasone  2 spray Each Nare Daily   heparin injection (subcutaneous)  5,000 Units Subcutaneous Q8H   multivitamin with minerals  1 tablet Oral Daily   revefenacin  175 mcg Nebulization Daily  Continuous Infusions:  ceFEPime (MAXIPIME) IV 2 g (12/24/21 1546)   vancomycin 750 mg (12/23/21 1835)    Diet Order             Diet regular Room service appropriate? Yes; Fluid consistency: Thin  Diet effective now                   Unresulted Labs (From admission, onward)     Start     Ordered   12/24/21 1202  MRSA Next Gen by PCR, Nasal  (MRSA Screening)  Once,   R        12/24/21 1201   12/23/21 1404  Expectorated Sputum Assessment w Gram Stain, Rflx to Resp Cult  Once,   R        12/23/21 1404          Data Reviewed: I have personally reviewed following labs and imaging studies CBC: Recent Labs  Lab 12/23/21 1010 12/24/21 0808  WBC 8.4 8.3  NEUTROABS 6.1  --   HGB 13.2 12.1  HCT 41.5 38.6  MCV 86.8 87.9  PLT 447* 443*   Basic Metabolic Panel: Recent Labs  Lab 12/23/21 1010 12/24/21 0808  NA 136 137  K 4.2 3.9  CL 103 108  CO2 26 25  GLUCOSE 89 89  BUN 14 13  CREATININE 0.41* 0.45  CALCIUM 7.5* 7.5*   GFR: Estimated Creatinine Clearance: 29.1 mL/min (by C-G formula based on SCr of 0.45 mg/dL). Liver Function Tests: Antimicrobials: Anti-infectives (From admission, onward)    Start     Dose/Rate Route Frequency Ordered Stop   12/23/21 1600  ceFEPIme (MAXIPIME) 2 g in sodium chloride 0.9 % 100 mL IVPB        2 g 200 mL/hr over 30 Minutes Intravenous Every 24 hours 12/23/21 1504     12/23/21 1515  vancomycin (VANCOREADY) IVPB 750 mg/150 mL        750 mg 150 mL/hr over 60 Minutes Intravenous Every 48 hours 12/23/21 1504     12/23/21 1045  cefTRIAXone (ROCEPHIN) 1 g in sodium chloride 0.9 % 100 mL IVPB  Status:  Discontinued        1 g 200 mL/hr over 30 Minutes Intravenous Every 24 hours  12/23/21 1043 12/23/21 1505   12/23/21 1045  metroNIDAZOLE (FLAGYL) IVPB 500 mg        500 mg 100 mL/hr over 60 Minutes Intravenous  Once 12/23/21 1043 12/23/21 1304      Culture/Microbiology    Component Value Date/Time   SDES  12/23/2021 1616    BLOOD BLOOD LEFT ARM Performed at [facility], [place] [address], [place], [place] [address]    SPECREQUEST  12/23/2021 1616    BOTTLES DRAWN AEROBIC ONLY Blood Culture adequate volume Performed at [facility] [address], [place], [place] [address]    CULT  12/23/2021 1616    NO GROWTH 2 DAYS Performed at [facility] [address] [place], [place] [address]    REPTSTATUS PENDING 12/23/2021 1616   Radiology Studies: CT Chest W Contrast  Result Date: 12/23/2021 CLINICAL DATA:  Weakness. Shortness of breath. Right-sided lung cancer. Hypoxia. * Tracking Code: BO * EXAM:  CT CHEST WITH CONTRAST TECHNIQUE: Multidetector CT imaging of the chest was performed during intravenous contrast administration. RADIATION DOSE REDUCTION: This exam was performed according to the departmental dose-optimization program which includes automated exposure control, adjustment of the mA and/or kV according to patient size and/or use of iterative reconstruction technique. CONTRAST:  151mL OMNIPAQUE IOHEXOL 300 MG/ML  SOLN COMPARISON:  Plain film of earlier today.  Chest CT 06/20/2020 FINDINGS: Moderately motion and arm position degraded exam. Cardiovascular: Aortic atherosclerosis. Normal heart size, with small volume anterior pericardial fluid. No central pulmonary embolism, on this non-dedicated study. Pulmonary artery enlargement, outflow tract 3.2 cm. Mediastinum/Nodes: No mediastinal or hilar adenopathy. Lungs/Pleura: Moderate right pleural effusion. Moderate centrilobular emphysema. Right lower lobe endobronchial compression or occlusion including on 74/7, new. Inferolateral right upper lobe 8 mm pulmonary nodule on  63/7, new since the prior CT. Right lower lobe lung mass measures 5.2 x 5.0 cm on 78/2 versus 3.3 x 2.5 cm on 06/20/2020. Left upper lobe pleural-based nodule measures 1.0 cm on 37/7. Right worse than left ground-glass opacity and interstitial thickening are relatively similar in severity to 06/20/2020. Upper Abdomen: Multiple small gallstones. Probable hepatic steatosis. Normal imaged portions of the spleen, stomach, pancreas, adrenal glands, kidneys. Musculoskeletal: Osteopenia. IMPRESSION: 1. Moderately degraded exam secondary to motion and patient arm position. 2. Enlargement of right lower lobe lung mass since 06/20/2020. Development of pleural-based pulmonary nodules, highly suspicious for metastatic disease. 3. Moderate right pleural effusion. 4. Bilateral interstitial opacities which are relatively similar to 06/20/2020. Considerations include postinfectious/inflammatory scarring, recurrent infection, including atypical etiologies, and less likely pulmonary edema. Lymphangitic tumor spread is felt less likely given relative stability over 18 months. 5. No thoracic adenopathy. 6. Aortic atherosclerosis (ICD10-I70.0) and emphysema (ICD10-J43.9). 7. Pulmonary artery enlargement suggests pulmonary arterial hypertension. 8. Cholelithiasis Electronically Signed   By: Abigail Miyamoto M.D.   On: 12/23/2021 16:04     LOS: 2 days   Antonieta Pert, MD [facility]  12/25/2021, 11:35 AM

## 2021-12-26 DIAGNOSIS — J9601 Acute respiratory failure with hypoxia: Secondary | ICD-10-CM | POA: Diagnosis not present

## 2021-12-26 NOTE — Progress Notes (Signed)
[id number] [facility]) Hospital Liaison Note   [facility] has been following patient during hospital stay. She has been a current patient with our outpatient palliative medicine team and is now requesting hospice services at home after discharge.    Spoke with patient's daughter, [name], initiate education related to hospice philosophy, services, and team approach to care. Terri verbalized understanding of information given. Per discussion, the plan is for patient to discharge home via ambulance once cleared to DC.    DME needs discussed. Patient has the following equipment in the home: None Patient requests the following equipment for delivery: Hospital bed, wheelchair, overbed table, and oxygen at 2L/min.   Address verified and is correct in the chart. [name] is the family member to contact to arrange time of equipment delivery.    Please send signed and completed DNR home with patient/family. Please provide prescriptions at discharge as needed to ensure ongoing symptom management.    [facility] information and contact numbers given to family & above information shared with hospital team.   Please call with any questions/concerns.    Thank you for the opportunity to participate in this patient's care.   Zigmund Gottron  [facility] Liaison  [phone]

## 2021-12-26 NOTE — Progress Notes (Signed)
Mobility Specialist - Progress Note   12/26/21 1607  Mobility  Activity Refused mobility   Mobility Specialist Cancellation/Refusal Note:   Reason for Cancellation/Refusal: Pt declined mobility at this time. Pt requested that I come back in the morning. Will check back as schedule permits.    [facility]

## 2021-12-26 NOTE — Progress Notes (Signed)
Pharmacy Antibiotic Note  Melanie Walker is a 88 y.o. female admitted on 12/23/2021 with weakness, SOB, hypoxia. PMH significant for emphysema. Pt has RLL mass - suspected neoplasm and right pleural effusion. Pt on broad spectrum antibiotics for post-obstructive pneumonia - pharmacy to dose cefepime + vancomycin.   Today, 12/26/21 Today is day #4 IV antibiotics MRSA PCR was ordered, but pt refused TBW < IBW SCr low, but TBW low. CrCl ~29 mL/min  Plan: Continue cefepime 2 g IV q24h Continue vancomycin 750 mg IV q48h (SCr rounded to 0.8 for dosing, TBW < IBW, estimated AUC 473) Goal vancomycin AUC 400-550. Check levels at steady state as needed Monitor renal function. Ordered SCr to be checked with AM labs tomorrow, next dose of vancomycin not due until 9/23 @ 1500   Height: 4' 11.5" (151.1 cm) Weight: 38.6 kg (85 lb) IBW/kg (Calculated) : 44.35  Temp (24hrs), Avg:98.1 F (36.7 C), Min:97.5 F (36.4 C), Max:98.9 F (37.2 C)  Recent Labs  Lab 12/23/21 1010 12/24/21 0808  WBC 8.4 8.3  CREATININE 0.41* 0.45     Estimated Creatinine Clearance: 29.1 mL/min (by C-G formula based on SCr of 0.45 mg/dL).    Allergies  Allergen Reactions   Biaxin [Clarithromycin] Other (See Comments)    Unknown per Daughter    Jaclyn Prime [Ibandronic Acid] Other (See Comments)    Upset stomach   Ceftin [Cefuroxime Axetil] Other (See Comments)    Unknown per Daughter    Codeine Other (See Comments)    Unknown per Daughter    Dilantin [Phenytoin Sodium Extended] Other (See Comments)    Unknown per Daughter    Fosamax [Alendronate Sodium] Other (See Comments)    Upset stomach   Guaifenesin & Derivatives Other (See Comments)    Unknown per Daughter    Imitrex [Sumatriptan] Other (See Comments)    Unknown per Daughter    Keflex [Cephalexin] Other (See Comments)    Unknown per Daughter    Macrobid [Nitrofurantoin Macrocrystal] Hives and Itching   Montelukast Other (See Comments)    Unknown per  Daughter    Other Other (See Comments)    Flu shot made rash on arm and caused itching to arm   Penicillins Other (See Comments)    Unknown per Daughter    Premarin [Conjugated Estrogens] Itching   Sulfa Antibiotics Other (See Comments)    Unknown per Daughter    Hctz [Hydrochlorothiazide] Rash   Latex Rash    Antimicrobials this admission: 9/19 Ceftriaxone x1 9/19 metronidazole x1 9/19 Vancomycin >> 9/19 Cefepime >>  Microbiology results: 9/19 BCx: ngtd 9/19 MRSA PCR:    Lenis Noon, PharmD 12/26/21 10:32 AM

## 2021-12-26 NOTE — Plan of Care (Signed)

## 2021-12-26 NOTE — TOC Initial Note (Addendum)
Transition of Care [facility]) - Initial/Assessment Note    Patient Details  Name: Melanie Walker MRN: [id number] Date of Birth: 1930/06/18  [facility]) CM/SW Contact:    Vassie Moselle, LCSW Phone Number: 12/26/2021, 11:46 AM  Clinical Narrative:                 Pt has been active with [facility] for outpatient palliative care prior to this admission. Pt is now being recommended for Hospice services and will be discharged home with Hospice through [facility]. Pt has hospital bed, wheelchair, overbed table, and O2 that have been ordered through [facility] and will be delivered to pt's home. Per [facility] pt's daughter does not want DME delivered until Sunday however, pt is expected to be ready for discharge prior to Sunday. CSW called and spoke to pt's daughter, Melanie Walker who states she does not want DME delivered until Sunday due to weather forecast for Saturday. CSW let pt's daughter know that pt will most likely be medically ready to discharge prior to Sunday and that DME will need to be delivered prior to discharge. CSW asked whether DME could be delivered today in which, daughter responded that she does not have everything ready for it to be delivered today. Pt's daughter states that she feels she has been told different things from yesterday and today and is frustrated that pt cannot remain in the hospital. CSW reiterated that pt cannot remain in hospital once she is medically stable for discharge. Pt's daughter requesting to speak with MD. MD notified.   Update 1:19- Pt is to have DME delivered on Sunday 12/28/21 and plan will be for pt to discharge the following day on Monday, 12/29/21.   Expected Discharge Plan: Home w Hospice Care Barriers to Discharge: No Barriers Identified   Patient Goals and CMS Choice Patient states their goals for this hospitalization and ongoing recovery are:: To return home with hospice   Choice offered to / list presented to : Adult  Children  Expected Discharge Plan and Services Expected Discharge Plan: Home w Hospice Care In-house Referral: Hospice / Palliative Care Discharge Planning Services: CM Consult Post Acute Care Choice: Durable Medical Equipment, Hospice Living arrangements for the past 2 months: [place]                 DME Arranged: Hospital bed, Oxygen, Overbed table, Wheelchair manual DME Agency: [facility] [place] ([facility]) Date DME Agency Contacted: 12/26/21 Time DME Agency Contacted: [phone] Representative spoke with at DME Agency: Nicholaus Corolla            Prior Living Arrangements/Services Living arrangements for the past 2 months: Single Family Home Lives with:: Adult Children Patient language and need for interpreter reviewed:: Yes Do you feel safe going back to the place where you live?: Yes      Need for Family Participation in Patient Care: Yes (Comment) Care giver support system in place?: Yes (comment) Current home services: Other (comment) (Palliative Care) Criminal Activity/Legal Involvement Pertinent to Current Situation/Hospitalization: No - Comment as needed  Activities of Daily Living Home Assistive Devices/Equipment: Bedside commode/3-in-1, Walker (specify type), Shower chair without back ADL Screening (condition at time of admission) Patient's cognitive ability adequate to safely complete daily activities?: No Is the patient deaf or have difficulty hearing?: Yes Does the patient have difficulty seeing, even when wearing glasses/contacts?: Yes Does the patient have difficulty concentrating, remembering, or making decisions?: Yes Patient able to express need for assistance  with ADLs?: Yes Does the patient have difficulty dressing or bathing?: Yes Independently performs ADLs?: No Communication: Independent Dressing (OT): Needs assistance Grooming: Needs assistance Feeding: Needs assistance Bathing: Needs assistance Toileting: Needs  assistance In/Out Bed: Needs assistance Walks in Home: Needs assistance Does the patient have difficulty walking or climbing stairs?: Yes Weakness of Legs: Both Weakness of Arms/Hands: Both  Permission Sought/Granted Permission sought to share information with : Case Manager, Family Supports Permission granted to share information with : No              Emotional Assessment   Attitude/Demeanor/Rapport: Unable to Assess Affect (typically observed): Unable to Assess Orientation: : Oriented to Self, Oriented to Place Alcohol / Substance Use: Not Applicable Psych Involvement: No (comment)  Admission diagnosis:  Weakness [R53.1] Pleural effusion [J90] Acute respiratory failure with hypoxia (HCC) [J96.01] Patient Active Problem List   Diagnosis Date Noted   Protein-calorie malnutrition, severe 12/24/2021   Acute respiratory failure with hypoxia (HCC) 12/23/2021   PNA (pneumonia) 12/23/2021   Chronic respiratory failure with hypoxia (HCC) 12/15/2021   Constipation 12/15/2021   Shortness of breath 09/11/2021   Palliative care encounter 09/11/2021   Abnormal urine odor 09/11/2021   Bedbound 09/11/2021   UTI (urinary tract infection)-gram negative 08/22/2021   Acute urinary retention 41/58/3094   Acute metabolic encephalopathy 07/68/0881   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[place]) 06/21/2020   COPD with emphysema ([place]) 06/21/2020   Acute respiratory failure due to COVID-19 ([place]) 06/20/2020   Mild traumatic brain injury ([place]) 03/29/2018   Lumbar radiculopathy 09/30/2017   Paresthesia 02/12/2016   Abnormality of gait 03/12/2015   Risk for falls 01/25/2015   Essential hypertension 01/25/2015   Seizure ([place])    Complex partial seizure ([place]) 03/08/2013   Numbness 03/08/2013   Low back pain 03/08/2013   PCP:  Lajean Manes, MD Pharmacy:   [facility] [place], [place] - [address] [place] [address] Phone: [phone] Fax:  [phone]     Social Determinants of Health ([place]) Interventions    Readmission Risk Interventions    12/26/2021   11:38 AM  Readmission Risk Prevention Plan  Post Dischage Appt Complete  Medication Screening Complete  Transportation Screening Complete

## 2021-12-26 NOTE — Progress Notes (Signed)
PROGRESS NOTE Melanie Walker  HUD:[id number] DOB: [birth year] DOA: 12/23/2021 PCP: Lajean Manes, MD   Brief Narrative/Hospital Course: 88 y.o. female with medical history significant of HTN, complex partial seizures, emphysema. Presented with weakness and shortness of breath despite multiple rounds of antibiotics and steroids over the last few weeks due to increased cough and presumed COPD exacerbation. Seen in the ED,labs showed WBC count 8.4 stable renal function troponin negative PCO2 59 VBG chest x-ray moderate to large right pleural effusion> CT enlargement of right lower lobe lung mass since 06/20/2020 with development of pleural-based pulmonary nodules highly suspicious for metastatic disease, moderate right pleural effusion, bilateral interstitial opacities similar to prior, aortic atherosclerosis, pulmonary artery enlargement.Pulmonary was consulted-discussed thoracentesis and daughter wanted some time to decide.Palliative care has been consulted to discuss goals of care and potentially for hospice.  Declined recommendation for thoracentesis.  Subjective: Seen and examined.  Patient is resting using nebulizer, caregiver at the bedside, when asked how she feels she says  "lousy" caregiver states "she always says that" Overnight afebrile Last WBC count normal 8.3.  Assessment and Plan: Principal Problem:   Acute respiratory failure with hypoxia (HCC) Active Problems:   COPD with emphysema (HCC)   Complex partial seizure ([place])   Essential hypertension   PNA (pneumonia)   Protein-calorie malnutrition, severe  Right pleural effusion Spectated RLL neoplasm Acute hypoxic respiratory insufficiency Postobstructive right lower lobe pneumonia: Patient had RLL mass PET/CT not pursued in the past that she unfortunately developed COVID-19 pneumonia shortly after her visit with Dr. Valeta Harms.Seen by pulmonary however diagnostic/therapeutic thoracentesis daughter/patient declined.Palliative care has  been consulted plan is for return home with hospice.  Keep on IV antibiotics and plan to switch to p.o. moxifloxacin upon discharge .  Continue supplemental oxygen, bronchodilators. Did gust with pulmonary  CHY:IFOY code.Palliative care with consulted plan for return home with hospice. She is followed by [facility] outpatient hosp[facility] team.  History of complex partial seizure not on AED Emphysema: Continue respiratory support Essential hypertension no longer on meds BP stable Severe malnutrition: Augment diet as below. Body mass index is 16.88 kg/m. Nutrition Problem: Severe Malnutrition Etiology: chronic illness (empysema, advanced age) Signs/Symptoms: moderate fat depletion, severe fat depletion, severe muscle depletion Interventions: Ensure Enlive (each supplement provides 350kcal and 20 grams of protein), MVI  DVT prophylaxis: heparin injection 5,000 Units Start: 12/23/21 1800 Code Status:   Code Status: Full Code Family Communication: plan of care discussed with patient/patient's daughter over the phone 9/20  Patient status is: Inpatient because of further work-up of pleural effusion/lung mass Level of care: Telemetry  Dispo: The patient is from: home with caregiver            Anticipated disposition: Return home with hospice. Daughter informs me that she is not able to receive any kind of equipment at home until Sunday afternoon as she needs help to move out of her furniture.Once set up ready she can be discharged at this time she is medically stable   Objective: Vitals last 24 hrs: Vitals:   12/25/21 0803 12/25/21 1344 12/25/21 1954 12/26/21 0517  BP:  (!) 140/64 128/60 134/69  Pulse:  84 74 90  Resp:  16 18 20   Temp:  98 F (36.7 C) (!) 97.5 F (36.4 C) 98.9 F (37.2 C)  TempSrc:   Oral Oral  SpO2: 99% 99% 96% 98%  Weight:      Height:       Weight change:   Physical Examination: General exam: AA, elderly,  frail HEENT:Oral mucosa moist, Ear/Nose WNL grossly, dentition  normal. Respiratory system: bilaterally diminished, no use of accessory muscle Cardiovascular system: S1 & S2 +, No JVD,. Gastrointestinal system: Abdomen soft,NT,ND,BS+ Nervous System:Alert, awake, moving extremities and grossly nonfocal Extremities: LE ankle edema neg,distal peripheral pulses palpable.  Skin: No rashes,no icterus. MSK: Normal muscle bulk,tone, power   Medications reviewed:  Scheduled Meds:  arformoterol  15 mcg Nebulization BID   feeding supplement  1 Container Oral TID BM   fluticasone  2 spray Each Nare Daily   heparin injection (subcutaneous)  5,000 Units Subcutaneous Q8H   multivitamin with minerals  1 tablet Oral Daily   revefenacin  175 mcg Nebulization Daily   saccharomyces boulardii  250 mg Oral BID  Continuous Infusions:  ceFEPime (MAXIPIME) IV Stopped (12/25/21 1605)   vancomycin Stopped (12/25/21 1716)    Diet Order             Diet regular Room service appropriate? Yes; Fluid consistency: Thin  Diet effective now                   Unresulted Labs (From admission, onward)     Start     Ordered   12/27/21 0500  Creatinine, serum  Tomorrow morning,   R       Question:  Specimen collection method  Answer:  Lab=Lab collect   12/26/21 1017   12/24/21 1202  MRSA Next Gen by PCR, Nasal  (MRSA Screening)  Once,   R        12/24/21 1201   12/23/21 1404  Expectorated Sputum Assessment w Gram Stain, Rflx to Resp Cult  Once,   R        12/23/21 1404          Data Reviewed: I have personally reviewed following labs and imaging studies CBC: Recent Labs  Lab 12/23/21 1010 12/24/21 0808  WBC 8.4 8.3  NEUTROABS 6.1  --   HGB 13.2 12.1  HCT 41.5 38.6  MCV 86.8 87.9  PLT 447* 431*    Basic Metabolic Panel: Recent Labs  Lab 12/23/21 1010 12/24/21 0808  NA 136 137  K 4.2 3.9  CL 103 108  CO2 26 25  GLUCOSE 89 89  BUN 14 13  CREATININE 0.41* 0.45  CALCIUM 7.5* 7.5*    GFR: Estimated Creatinine Clearance: 29.1 mL/min (by C-G formula  based on SCr of 0.45 mg/dL). Liver Function Tests: Antimicrobials: Anti-infectives (From admission, onward)    Start     Dose/Rate Route Frequency Ordered Stop   12/23/21 1600  ceFEPIme (MAXIPIME) 2 g in sodium chloride 0.9 % 100 mL IVPB        2 g 200 mL/hr over 30 Minutes Intravenous Every 24 hours 12/23/21 1504     12/23/21 1515  vancomycin (VANCOREADY) IVPB 750 mg/150 mL        750 mg 150 mL/hr over 60 Minutes Intravenous Every 48 hours 12/23/21 1504     12/23/21 1045  cefTRIAXone (ROCEPHIN) 1 g in sodium chloride 0.9 % 100 mL IVPB  Status:  Discontinued        1 g 200 mL/hr over 30 Minutes Intravenous Every 24 hours 12/23/21 1043 12/23/21 1505   12/23/21 1045  metroNIDAZOLE (FLAGYL) IVPB 500 mg        500 mg 100 mL/hr over 60 Minutes Intravenous  Once 12/23/21 1043 12/23/21 1304      Culture/Microbiology    Component Value Date/Time   SDES  12/23/2021  1616    BLOOD BLOOD LEFT ARM Performed at [facility] [address], [place], [place] [address]    SPECREQUEST  12/23/2021 1616    BOTTLES DRAWN AEROBIC ONLY Blood Culture adequate volume Performed at [facility] [address], [place], [place] [address]    CULT  12/23/2021 1616    NO GROWTH 3 DAYS Performed at [facility] [address] [place], [place] [address]    REPTSTATUS PENDING 12/23/2021 1616   Radiology Studies: No results found.   LOS: 3 days   Antonieta Pert, MD [facility]  12/26/2021, 1:09 PM

## 2021-12-27 DIAGNOSIS — J9601 Acute respiratory failure with hypoxia: Secondary | ICD-10-CM | POA: Diagnosis not present

## 2021-12-27 LAB — CREATININE, SERUM
Creatinine, Ser: 0.39 mg/dL — ABNORMAL LOW (ref 0.44–1.00)
GFR, Estimated: >60 mL/min (ref >60)

## 2021-12-27 MED ORDER — HYDROXYZINE HCL 10 MG PO TABS
10.0000 mg | ORAL_TABLET | Freq: Four times a day (QID) | ORAL | Status: DC | PRN
  Administered 2021-12-27 – 2021-12-28 (×2): 10 mg via ORAL
  Filled 2021-12-27 (×4): qty 1

## 2021-12-27 MED ORDER — MOXIFLOXACIN HCL 400 MG PO TABS
400.0000 mg | ORAL_TABLET | Freq: Every day | ORAL | Status: DC
  Administered 2021-12-27 – 2021-12-28 (×2): 400 mg via ORAL
  Filled 2021-12-27 (×3): qty 1

## 2021-12-27 NOTE — Progress Notes (Signed)
[facility] ([facility])  [facility] has been following patient during hospital stay. She has been a current patient with our outpatient palliative medicine team and is now requesting hospice services at home after discharge.     Plan is to get DME delivered at the home on Sunday evening, with a Monday discharge once verified delivery of DME. This is via [facility].  DME request: Hospital bed, WC, OTB table, Oxygen 2L. Address correct in chart.  Please send signed and completed DNR home with patient/family. Please provide prescriptions at discharge as needed to ensure ongoing symptom management.    Please feel free to call with any hospice related questions.   Clementeen Hoof, [facility] Liaison [phone]

## 2021-12-27 NOTE — Progress Notes (Addendum)
Pharmacy Antibiotic Note  Melanie Walker is a 88 y.o. female admitted on 12/23/2021 with weakness, SOB, hypoxia. PMH significant for emphysema. Pt has RLL mass - suspected neoplasm and right pleural effusion. Pt was on broad spectrum antibiotics for post-obstructive pneumonia - pharmacy was dosing cefepime + vancomycin. Today, Pharmacy has been consulted to change antibiotic regimen to moxifloxacin.  Today, patient is afebrile, WBC WNL, Scr less than 0.1, on regular diet and taking oral medications.    Plan: Discontinue cefepime and vancomycin Initiate moxifloxacin 400 mg po qday (at least 4 hours before or 8 hours after products containing magnesium, aluminum, iron, or zinc, including antacids, sucralfate, and multivitamins)  No dose adjustments expected and pharmacy will sign off while continuing to monitor clinical progress   Height: 4' 11.5" (151.1 cm) Weight: 38.6 kg (85 lb) IBW/kg (Calculated) : 44.35  Temp (24hrs), Avg:97.6 F (36.4 C), Min:97.4 F (36.3 C), Max:97.9 F (36.6 C)  Recent Labs  Lab 12/23/21 1010 12/24/21 0808 12/27/21 0556  WBC 8.4 8.3  --   CREATININE 0.41* 0.45 0.39*    Estimated Creatinine Clearance: 29.1 mL/min (A) (by C-G formula based on SCr of 0.39 mg/dL (L)).    Allergies  Allergen Reactions   Biaxin [Clarithromycin] Other (See Comments)    Unknown per Daughter    Jaclyn Prime [Ibandronic Acid] Other (See Comments)    Upset stomach   Ceftin [Cefuroxime Axetil] Other (See Comments)    Unknown per Daughter    Codeine Other (See Comments)    Unknown per Daughter    Dilantin [Phenytoin Sodium Extended] Other (See Comments)    Unknown per Daughter    Fosamax [Alendronate Sodium] Other (See Comments)    Upset stomach   Guaifenesin & Derivatives Other (See Comments)    Unknown per Daughter    Imitrex [Sumatriptan] Other (See Comments)    Unknown per Daughter    Keflex [Cephalexin] Other (See Comments)    Unknown per Daughter    Macrobid  [Nitrofurantoin Macrocrystal] Hives and Itching   Montelukast Other (See Comments)    Unknown per Daughter    Other Other (See Comments)    Flu shot made rash on arm and caused itching to arm   Penicillins Other (See Comments)    Unknown per Daughter    Premarin [Conjugated Estrogens] Itching   Sulfa Antibiotics Other (See Comments)    Unknown per Daughter    Hctz [Hydrochlorothiazide] Rash   Latex Rash      Thank you for allowing pharmacy to be a part of this patient's care.  Royetta Asal, PharmD, BCPS Clinical Pharmacist [place] Please utilize Amion for appropriate phone number to reach the unit pharmacist ([place]) 12/27/2021 10:02 AM

## 2021-12-27 NOTE — Progress Notes (Signed)
Mobility Specialist Cancellation/Refusal Note:    12/27/21 1054  Mobility  Activity Refused mobility    Reason for Cancellation/Refusal: Pt declined mobility at this time. Pt sleeping. Will check back as schedule permits.      [facility]

## 2021-12-27 NOTE — Progress Notes (Signed)
PROGRESS NOTE Melanie Walker  CWC:[id number] DOB: 08-10-1930 DOA: 12/23/2021 PCP: Lajean Manes, MD   Brief Narrative/Hospital Course: 88 y.o. female with medical history significant of HTN, complex partial seizures, emphysema. Presented with weakness and shortness of breath despite multiple rounds of antibiotics and steroids over the last few weeks due to increased cough and presumed COPD exacerbation. Seen in the ED,labs showed WBC count 8.4 stable renal function troponin negative PCO2 59 VBG chest x-ray moderate to large right pleural effusion> CT enlargement of right lower lobe lung mass since 06/20/2020 with development of pleural-based pulmonary nodules highly suspicious for metastatic disease, moderate right pleural effusion, bilateral interstitial opacities similar to prior, aortic atherosclerosis, pulmonary artery enlargement.Pulmonary was consulted-discussed thoracentesis and daughter wanted some time to decide.Palliative care has been consulted to discuss goals of care and potentially for hospice.  Declined recommendation for thoracentesis.  Subjective: Seen and examined.  Caregiver at the bedside. Resting comfortably nursing reports she ate 2 trays of breakfast, she feels fine. Overnight patient has been afebrile  On 2 L nasal cannula.    Assessment and Plan: Principal Problem:   Acute respiratory failure with hypoxia (HCC) Active Problems:   COPD with emphysema (HCC)   Complex partial seizure ([place])   Essential hypertension   PNA (pneumonia)   Protein-calorie malnutrition, severe  Right pleural effusion Spectated RLL neoplasm Acute hypoxic respiratory insufficiency Postobstructive right lower lobe pneumonia: Patient had RLL mass PET/CT not pursued in the past that she unfortunately developed COVID-19 pneumonia shortly after her visit with Dr. Valeta Harms.Seen by pulmonary offered diagnostic/therapeutic thoracentesis but daughter/patient declined.Palliative care has been consulted plan  is for discharge to home with hospice. Will switch to p.o. moxifloxacin if available here or on d/c.continue supplemental oxygen, bronchodilators. Did gust with pulmonary  VOH:YWVP code.Palliative care with consulted plan for return home with hospice. She is followed by [facility] outpatient hospice team.  History of complex partial seizure not on AED Emphysema: Continue respiratory support Essential hypertension no longer on meds BP stable Severe malnutrition: Augment diet as below. Body mass index is 16.88 kg/m. Nutrition Problem: Severe Malnutrition Etiology: chronic illness (empysema, advanced age) Signs/Symptoms: moderate fat depletion, severe fat depletion, severe muscle depletion Interventions: Ensure Enlive (each supplement provides 350kcal and 20 grams of protein), MVI  DVT prophylaxis: heparin injection 5,000 Units Start: 12/23/21 1800 Code Status:   Code Status: Full Code Family Communication: plan of care discussed with patient/patient's daughter over the phone 9/20  Patient status is: Inpatient because of further work-up of pleural effusion/lung mass Level of care: Telemetry  Dispo: The patient is from: home with caregiver            Anticipated disposition: Return home with hospice.  Extensively discussed with daughter 12/2018 - she informed that she is not able to receive any kind of equipment at home until Sunday afternoon as she needs help to move out of her furniture.Once set up ready she can be discharged at this time she is medically stable   Objective: Vitals last 24 hrs: Vitals:   12/26/21 1329 12/26/21 2019 12/27/21 0545 12/27/21 0845  BP: 98/60 (!) 114/57 128/75   Pulse: 91 86 70   Resp: 17 18 18    Temp: (!) 97.4 F (36.3 C) 97.9 F (36.6 C) (!) 97.5 F (36.4 C)   TempSrc: Oral Oral Oral   SpO2: 99% 94% 95% 96%  Weight:      Height:       Weight change:   Physical Examination: General exam:  AA, elderly, thin older than stated age, weak appearing. HEENT:Oral  mucosa moist, Ear/Nose WNL grossly, dentition normal. Respiratory system: bilaterally diminished, no use of accessory muscle Cardiovascular system: S1 & S2 +, No JVD,. Gastrointestinal system: Abdomen soft,NT,ND,BS+ Nervous System:Alert, awake, moving extremities and grossly nonfocal Extremities: LE ankle edema neg, distal peripheral pulses palpable.  Skin: No rashes,no icterus. MSK: Normal muscle bulk,tone, power   Medications reviewed:  Scheduled Meds:  arformoterol  15 mcg Nebulization BID   feeding supplement  1 Container Oral TID BM   fluticasone  2 spray Each Nare Daily   heparin injection (subcutaneous)  5,000 Units Subcutaneous Q8H   moxifloxacin  400 mg Oral q1800   multivitamin with minerals  1 tablet Oral Daily   revefenacin  175 mcg Nebulization Daily   saccharomyces boulardii  250 mg Oral BID  Continuous Infusions:    Diet Order             Diet regular Room service appropriate? Yes; Fluid consistency: Thin  Diet effective now                   Unresulted Labs (From admission, onward)     Start     Ordered   12/24/21 1202  MRSA Next Gen by PCR, Nasal  (MRSA Screening)  Once,   R        12/24/21 1201   12/23/21 1404  Expectorated Sputum Assessment w Gram Stain, Rflx to Resp Cult  Once,   R        12/23/21 1404          Data Reviewed: I have personally reviewed following labs and imaging studies CBC: Recent Labs  Lab 12/23/21 1010 12/24/21 0808  WBC 8.4 8.3  NEUTROABS 6.1  --   HGB 13.2 12.1  HCT 41.5 38.6  MCV 86.8 87.9  PLT 447* 353*   Basic Metabolic Panel: Recent Labs  Lab 12/23/21 1010 12/24/21 0808 12/27/21 0556  NA 136 137  --   K 4.2 3.9  --   CL 103 108  --   CO2 26 25  --   GLUCOSE 89 89  --   BUN 14 13  --   CREATININE 0.41* 0.45 0.39*  CALCIUM 7.5* 7.5*  --    GFR: Estimated Creatinine Clearance: 29.1 mL/min (A) (by C-G formula based on SCr of 0.39 mg/dL (L)). Liver Function Tests: Antimicrobials: Anti-infectives  (From admission, onward)    Start     Dose/Rate Route Frequency Ordered Stop   12/27/21 1800  moxifloxacin (AVELOX) tablet 400 mg        400 mg Oral Daily-1800 12/27/21 0949     12/23/21 1600  ceFEPIme (MAXIPIME) 2 g in sodium chloride 0.9 % 100 mL IVPB  Status:  Discontinued        2 g 200 mL/hr over 30 Minutes Intravenous Every 24 hours 12/23/21 1504 12/27/21 0944   12/23/21 1515  vancomycin (VANCOREADY) IVPB 750 mg/150 mL  Status:  Discontinued        750 mg 150 mL/hr over 60 Minutes Intravenous Every 48 hours 12/23/21 1504 12/27/21 0959   12/23/21 1045  cefTRIAXone (ROCEPHIN) 1 g in sodium chloride 0.9 % 100 mL IVPB  Status:  Discontinued        1 g 200 mL/hr over 30 Minutes Intravenous Every 24 hours 12/23/21 1043 12/23/21 1505   12/23/21 1045  metroNIDAZOLE (FLAGYL) IVPB 500 mg        500 mg 100  mL/hr over 60 Minutes Intravenous  Once 12/23/21 1043 12/23/21 1304      Culture/Microbiology    Component Value Date/Time   SDES  12/23/2021 1616    BLOOD BLOOD LEFT ARM Performed at [facility], [place] [address] [place], [place] [address]    SPECREQUEST  12/23/2021 1616    BOTTLES DRAWN AEROBIC ONLY Blood Culture adequate volume Performed at [facility] [address], [place], [place] [address]    CULT  12/23/2021 1616    NO GROWTH 4 DAYS Performed at [facility] [address] [place], [place] [address]    REPTSTATUS PENDING 12/23/2021 1616   Radiology Studies: No results found.   LOS: 4 days   Antonieta Pert, MD [facility]  12/27/2021, 10:29 AM

## 2021-12-27 NOTE — Progress Notes (Signed)
Brief Pulmonary Progress Note  Late entry. Met with pt's daughter [name] at bedside 9/21. Discussed thoracentesis in detail - nature of procedure, risks. I recommended thoracentesis as I thought it was likely but not guaranteed to improve her work of breathing and could be diagnostically helpful. At the mention of using a needle even for local anesthetic, the patient recoiled and said she didn't want to do it, albeit without clear understanding of risks/benefits of the procedure. [name] [place] declines pursuing thoracentesis at this time, declines scheduling clinic visit for thoracentesis at this time.  We will sign off, glad to be reinvolved as condition changes  [place]

## 2021-12-28 DIAGNOSIS — J90 Pleural effusion, not elsewhere classified: Secondary | ICD-10-CM

## 2021-12-28 DIAGNOSIS — J9601 Acute respiratory failure with hypoxia: Secondary | ICD-10-CM | POA: Diagnosis not present

## 2021-12-28 LAB — CULTURE, BLOOD (ROUTINE X 2)
Culture: NO GROWTH
Culture: NO GROWTH
Special Requests: ADEQUATE

## 2021-12-28 NOTE — Progress Notes (Signed)
Daily Progress Note   Patient Name: Melanie Walker       Date: 12/28/2021 DOB: 1930/11/26  Age: 88 y.o. MRN#: [id number] Attending Physician: Antonieta Pert, MD Primary Care Physician: Lajean Manes, MD Admit Date: 12/23/2021  Reason for Consultation/Follow-up: Establishing goals of care  Patient Profile/HPI: 88 y.o. female  with past medical history of HTN, complex partial seizures, emphysema, pulmonary nodule with suspected cancer- workup was not pursued, admitted on 12/23/2021 with findings of postobstructive pneumonia, worsening lung mass with development of new pulmonary nodules, and large R pleural effusion. She is being treated with antibiotics, considering thoracentesis. Palliative medicine consulted for goals of care.    Subjective: Chart reviewed. Patient is sleeping. Caregiver at bedside- equipment being delivered today and then to go home with hospice.   Physical Exam Vitals and nursing note reviewed.  Constitutional:      Appearance: She is ill-appearing.     Comments: Frail, cachetic  Pulmonary:     Effort: Pulmonary effort is normal.  Neurological:     Comments: Awake, alert             Vital Signs: BP (!) 140/64 (BP Location: Left Arm)   Pulse 75   Temp 98.6 F (37 C)   Resp 16   Ht 4' 11.5" (1.511 m)   Wt 38.6 kg   SpO2 100%   BMI 16.88 kg/m  SpO2: SpO2: 100 % O2 Device: O2 Device: Nasal Cannula O2 Flow Rate: O2 Flow Rate (L/min): 2 L/min  Intake/output summary:  Intake/Output Summary (Last 24 hours) at 12/28/2021 1150 Last data filed at 12/27/2021 1815 Gross per 24 hour  Intake 711 ml  Output --  Net 711 ml    LBM: Last BM Date : 12/28/21 Baseline Weight: Weight: 38.6 kg Most recent weight: Weight: 38.6 kg       Palliative Assessment/Data: PPS:  30%      Patient Active Problem List   Diagnosis Date Noted   Protein-calorie malnutrition, severe 12/24/2021   Acute respiratory failure with hypoxia (HCC) 12/23/2021   PNA (pneumonia) 12/23/2021   Chronic respiratory failure with hypoxia ([place]) 12/15/2021   Constipation 12/15/2021   Shortness of breath 09/11/2021   Palliative care encounter 09/11/2021   Abnormal urine odor 09/11/2021   Bedbound 09/11/2021   UTI (urinary tract infection)-gram negative 08/22/2021  Acute urinary retention 44/81/8563   Acute metabolic encephalopathy 14/97/0263   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[place]) 06/21/2020   COPD with emphysema ([place]) 06/21/2020   Acute respiratory failure due to COVID-19 [facility]) 06/20/2020   Mild traumatic brain injury ([place]) 03/29/2018   Lumbar radiculopathy 09/30/2017   Paresthesia 02/12/2016   Abnormality of gait 03/12/2015   Risk for falls 01/25/2015   Essential hypertension 01/25/2015   Seizure ([place])    Complex partial seizure ([place]) 03/08/2013   Numbness 03/08/2013   Low back pain 03/08/2013    Palliative Care Assessment & Plan    Assessment/Recommendations/Plan  Patient with lung mass that is worsening and now with postobstructive pneumonia- goals of care are to continue hospitalization and treat what is treatable and then discharge home with hospice- no thoracentesis Plan for discharge likely tomorrow with hospice On discharge, would recommend scripts for: - Morphine Concentrate 10mg /0.10ml: 5mg  (0.50ml) sublingual every 1 hour as needed for pain or shortness of breath: Disp 89ml - Lorazepam 2mg /ml concentrated solution: 1mg  (0.72ml) sublingual every 4 hours as needed for anxiety: Disp 37ml   Code Status: Full code  Prognosis:  < 6 months  Discharge Planning: Home with Hospice  Care plan was discussed with patient's daughterKarna Christmas and care team.  Thank you for allowing the Palliative Medicine Team to assist in the care of this patient.  Greater than  50%  of this time was spent counseling and coordinating care related to the above assessment and plan.  Mariana Kaufman, AGNP-C Palliative Medicine   Please contact Palliative Medicine Team phone at [phone] for questions and concerns.

## 2021-12-28 NOTE — Progress Notes (Signed)
[facility] [id number] [facility]) Hospital Liaison Note  [facility] has been following patient during hospital stay. She has been a current patient with our outpatient palliative medicine team and is now requesting hospice services at home after discharge.     Plan is for DME delivery to the home Sunday, with discharge home via [place] on Monday.   DME requested: Hospital bed, WC, OTB table, Oxygen 2 lpm, extra long tubing, humidifier, and nebulizer. Address verified and correct in the chart.  Patient remains a Full Code. No DNR needed at time of discharge.  Please provide prescriptions at discharge as needed to ensure ongoing symptom management. Daughter is requesting medications for nighttime agitation.  Please call with any hospice related questions or concerns.  Thank you, Margaretmary Eddy, BSN, RN [place] [phone]  Thank you, Margaretmary Eddy, BSN, RN [facility] Liaison [phone]

## 2021-12-28 NOTE — Progress Notes (Signed)
PROGRESS NOTE Melanie Walker  QVZ:[id number] DOB: 1930/07/17 DOA: 12/23/2021 PCP: Lajean Manes, MD   Brief Narrative/Hospital Course: 88 y.o. female with medical history significant of HTN, complex partial seizures, emphysema. Presented with weakness and shortness of breath despite multiple rounds of antibiotics and steroids over the last few weeks due to increased cough and presumed COPD exacerbation. Seen in the ED,labs showed WBC count 8.4 stable renal function troponin negative PCO2 59 VBG chest x-ray moderate to large right pleural effusion> CT enlargement of right lower lobe lung mass since 06/20/2020 with development of pleural-based pulmonary nodules highly suspicious for metastatic disease, moderate right pleural effusion, bilateral interstitial opacities similar to prior, aortic atherosclerosis, pulmonary artery enlargement.Pulmonary was consulted-discussed thoracentesis and daughter wanted some time to decide.Palliative care has been consulted to discuss goals of care and potentially for hospice.  Declined recommendation for thoracentesis.  Subjective: Seen and examined.  Caregiver and nursing at the bedside. Again she states she feels lousy/. Appears comfortable not in distress   Assessment and Plan: Principal Problem:   Acute respiratory failure with hypoxia (HCC) Active Problems:   COPD with emphysema (HCC)   Complex partial seizure (HCC)   Essential hypertension   PNA (pneumonia)   Protein-calorie malnutrition, severe  Right pleural effusion Spectated RLL neoplasm Acute hypoxic respiratory insufficiency Postobstructive right lower lobe pneumonia: Patient had RLL mass PET/CT not pursued in the past that she unfortunately developed COVID-19 pneumonia shortly after her visit with Dr. Valeta Harms.Seen by pulmonary offered diagnostic/therapeutic thoracentesis but daughter/patient declined.Palliative care has been consulted plan is for discharge to home with hospice. Now on moxifloxacin  cont same on d/c.continue supplemental oxygen, bronchodilators.   PIR:JJOA code.Palliative care with consulted plan for return home with hospice. She is followed by [facility] outpatient hospice team.  History of complex partial seizure not on AED Emphysema: Continue respiratory support Essential hypertension no longer on meds BP stable Severe malnutrition: Augment diet as below. Body mass index is 16.88 kg/m. Nutrition Problem: Severe Malnutrition Etiology: chronic illness (empysema, advanced age) Signs/Symptoms: moderate fat depletion, severe fat depletion, severe muscle depletion Interventions: Ensure Enlive (each supplement provides 350kcal and 20 grams of protein), MVI  DVT prophylaxis: heparin injection 5,000 Units Start: 12/23/21 1800 Code Status:   Code Status: Full Code Family Communication: plan of care discussed with patient/patient's daughter over the phone 9/20  Patient status is: Inpatient because of further work-up of pleural effusion/lung mass Level of care: Telemetry  Dispo: The patient is from: home with caregiver            Anticipated disposition: Return home with hospice Monday after daughter is able to receive her once equipments are delivered on Sunday  Objective: Vitals last 24 hrs: Vitals:   12/27/21 0845 12/27/21 1511 12/27/21 2033 12/28/21 0445  BP:  132/71 (!) 106/54 (!) 140/64  Pulse:  89 86 75  Resp:  18 18 16   Temp:  97.9 F (36.6 C) 98 F (36.7 C) 98.6 F (37 C)  TempSrc:  Oral Oral   SpO2: 96% 96% 94% 100%  Weight:      Height:       Weight change:   Physical Examination: General exam: AA, elderly frail, on 2 L nasal cannula  HEENT:Oral mucosa moist, Ear/Nose WNL grossly, dentition normal. Respiratory system: bilaterally clearno use of accessory muscle Cardiovascular system: S1 & S2 +, No JVD,. Gastrointestinal system: Abdomen soft,NT,ND, BS+ Nervous System:Alert, awake, moving extremities and grossly nonfocal Extremities: in fetal position,  edema neg,distal peripheral pulses palpable.  Skin: No rashes,no icterus. MSK: Normal muscle bulk,tone, power   Medications reviewed:  Scheduled Meds:  arformoterol  15 mcg Nebulization BID   feeding supplement  1 Container Oral TID BM   fluticasone  2 spray Each Nare Daily   heparin injection (subcutaneous)  5,000 Units Subcutaneous Q8H   moxifloxacin  400 mg Oral q1800   multivitamin with minerals  1 tablet Oral Daily   revefenacin  175 mcg Nebulization Daily   saccharomyces boulardii  250 mg Oral BID  Continuous Infusions:    Diet Order             Diet regular Room service appropriate? Yes; Fluid consistency: Thin  Diet effective now                   Unresulted Labs (From admission, onward)     Start     Ordered   12/24/21 1202  MRSA Next Gen by PCR, Nasal  (MRSA Screening)  Once,   R        12/24/21 1201   12/23/21 1404  Expectorated Sputum Assessment w Gram Stain, Rflx to Resp Cult  Once,   R        12/23/21 1404          Data Reviewed: I have personally reviewed following labs and imaging studies CBC: Recent Labs  Lab 12/23/21 1010 12/24/21 0808  WBC 8.4 8.3  NEUTROABS 6.1  --   HGB 13.2 12.1  HCT 41.5 38.6  MCV 86.8 87.9  PLT 447* 431*    Basic Metabolic Panel: Recent Labs  Lab 12/23/21 1010 12/24/21 0808 12/27/21 0556  NA 136 137  --   K 4.2 3.9  --   CL 103 108  --   CO2 26 25  --   GLUCOSE 89 89  --   BUN 14 13  --   CREATININE 0.41* 0.45 0.39*  CALCIUM 7.5* 7.5*  --     GFR: Estimated Creatinine Clearance: 29.1 mL/min (A) (by C-G formula based on SCr of 0.39 mg/dL (L)). Liver Function Tests: Antimicrobials: Anti-infectives (From admission, onward)    Start     Dose/Rate Route Frequency Ordered Stop   12/27/21 1800  moxifloxacin (AVELOX) tablet 400 mg        400 mg Oral Daily-1800 12/27/21 0949     12/23/21 1600  ceFEPIme (MAXIPIME) 2 g in sodium chloride 0.9 % 100 mL IVPB  Status:  Discontinued        2 g 200 mL/hr over 30  Minutes Intravenous Every 24 hours 12/23/21 1504 12/27/21 0944   12/23/21 1515  vancomycin (VANCOREADY) IVPB 750 mg/150 mL  Status:  Discontinued        750 mg 150 mL/hr over 60 Minutes Intravenous Every 48 hours 12/23/21 1504 12/27/21 0959   12/23/21 1045  cefTRIAXone (ROCEPHIN) 1 g in sodium chloride 0.9 % 100 mL IVPB  Status:  Discontinued        1 g 200 mL/hr over 30 Minutes Intravenous Every 24 hours 12/23/21 1043 12/23/21 1505   12/23/21 1045  metroNIDAZOLE (FLAGYL) IVPB 500 mg        500 mg 100 mL/hr over 60 Minutes Intravenous  Once 12/23/21 1043 12/23/21 1304      Culture/Microbiology    Component Value Date/Time   SDES  12/23/2021 1616    BLOOD BLOOD LEFT ARM Performed at [facility], [place] [address], [place], [place] [address]    [facility]  12/23/2021  [place] Blood Culture adequate volume Performed at [facility] [address] [place], [place] [address]    CULT  12/23/2021 1616    NO GROWTH 5 DAYS Performed at [facility] [address], [place], [place] [address]    REPTSTATUS 12/28/2021 FINAL 12/23/2021 1616   Radiology Studies: No results found.   LOS: 5 days   Antonieta Pert, MD [facility]  12/28/2021, 10:47 AM

## 2021-12-29 DIAGNOSIS — J9601 Acute respiratory failure with hypoxia: Secondary | ICD-10-CM | POA: Diagnosis not present

## 2021-12-29 MED ORDER — MOXIFLOXACIN HCL 400 MG PO TABS: 400.0000 mg | ORAL_TABLET | Freq: Every day | ORAL | 0 refills | Status: AC

## 2021-12-29 MED ORDER — SACCHAROMYCES BOULARDII 250 MG PO CAPS: 250.0000 mg | ORAL_CAPSULE | Freq: Two times a day (BID) | ORAL | 0 refills | Status: AC

## 2021-12-29 NOTE — Final Progress Note (Signed)
Pt discharged home with hospice services.  Transported via [facility], sent home with all belongings and AVS.

## 2021-12-29 NOTE — Discharge Summary (Signed)
Physician Discharge Summary  Melanie Walker HBZ:[id number] DOB: 06/16/1930 DOA: 12/23/2021  PCP: Lajean Manes, MD  Admit date: 12/23/2021 Discharge date: 12/29/2021 Recommendations for Outpatient Follow-up:  Follow up with PCP-hospice team on discharge Follow-up with pulmonary if you decide on thoracentesis.    Discharge Dispo: home w/ hospice Discharge Condition: Stable Code Status:   Code Status: Full Code Diet recommendation:  Diet Order             Diet regular Room service appropriate? Yes; Fluid consistency: Thin  Diet effective now                   Brief/Interim Summary:88 y.o. female with medical history significant of HTN, complex partial seizures, emphysema. Presented with weakness and shortness of breath despite multiple rounds of antibiotics and steroids over the last few weeks due to increased cough and presumed COPD exacerbation. Seen in the ED,labs showed WBC count 8.4 stable renal function troponin negative PCO2 59 VBG chest x-ray moderate to large right pleural effusion> CT enlargement of right lower lobe lung mass since 06/20/2020 with development of pleural-based pulmonary nodules highly suspicious for metastatic disease, moderate right pleural effusion, bilateral interstitial opacities similar to prior, aortic atherosclerosis, pulmonary artery enlargement.Pulmonary was consulted-discussed thoracentesis and daughter wanted some time to decide.Palliative care has been consulted to discuss goals of care and potentially for hospice.  Declined recommendation for thoracentesis. At this time plan is to return to home with hospice.  Send her home on oxygen supplement.  Discharge Diagnoses:  Principal Problem:   Acute respiratory failure with hypoxia (HCC) Active Problems:   COPD with emphysema (HCC)   Complex partial seizure ([place])   Essential hypertension   PNA (pneumonia)   Protein-calorie malnutrition, severe  Right pleural effusion Spectated RLL neoplasm Acute  hypoxic respiratory insufficiency Postobstructive right lower lobe pneumonia: Patient had RLL mass PET/CT not pursued in the past that she unfortunately developed COVID-19 pneumonia shortly after her visit with Dr. Valeta Harms.Seen by pulmonary offered diagnostic/therapeutic thoracentesis but daughter/patient declined.Palliative care has been consulted plan is for discharge to home with hospice today continue moxifloxacin to complete the course.  Continue supplement   BOF:BPZW code.Palliative care with consulted plan for return home with hospice. She is followed by [facility] outpatient hospice team.   History of complex partial seizure not on AED Emphysema: Continue respiratory support Essential hypertension no longer on meds BP stable Severe malnutrition: Augment diet as below. Body mass index is 16.88 kg/m. Nutrition Problem: Severe Malnutrition Etiology: chronic illness (empysema, advanced age) Signs/Symptoms: moderate fat depletion, severe fat depletion, severe muscle depletion Interventions: Ensure Enlive (each supplement provides 350kcal and 20 grams of protein), MVI  Consults: PCCM, palliative care Subjective: Alert awake resting comfortably.    At bedside.  Discharge Exam: Vitals:   12/29/21 0530 12/29/21 0915  BP: 134/65   Pulse: 94   Resp: 20   Temp: 98.5 F (36.9 C)   SpO2: 93% 94%   General: Pt is alert, awake, not in acute distress Cardiovascular: RRR, S1/S2 +, no rubs, no gallops Respiratory: CTA bilaterally, no wheezing, no rhonchi Abdominal: Soft, NT, ND, bowel sounds + Extremities: no edema, no cyanosis  Discharge Instructions  Discharge Instructions     Discharge instructions   Complete by: As directed    Follow-up with hospice at home for ongoing care Follow-up with pulmonary as needed if you decide on thoracentesis   No wound care   Complete by: As directed       Allergies  as of 12/29/2021       Reactions   Biaxin [clarithromycin] Other (See Comments)    Unknown per Daughter    Jaclyn Prime [ibandronic Acid] Other (See Comments)   Upset stomach   Ceftin [cefuroxime Axetil] Other (See Comments)   Unknown per Daughter    Codeine Other (See Comments)   Unknown per Daughter    Dilantin [phenytoin Sodium Extended] Other (See Comments)   Unknown per Daughter    Fosamax [alendronate Sodium] Other (See Comments)   Upset stomach   Guaifenesin & Derivatives Other (See Comments)   Unknown per Daughter    Imitrex [sumatriptan] Other (See Comments)   Unknown per Daughter    Keflex [cephalexin] Other (See Comments)   Unknown per Daughter    Macrobid [nitrofurantoin Macrocrystal] Hives, Itching   Montelukast Other (See Comments)   Unknown per Daughter    Other Other (See Comments)   Flu shot made rash on arm and caused itching to arm   Penicillins Other (See Comments)   Unknown per Daughter    Premarin [conjugated Estrogens] Itching   Sulfa Antibiotics Other (See Comments)   Unknown per Daughter    Hctz [hydrochlorothiazide] Rash   Latex Rash        Medication List     STOP taking these medications    amLODipine 5 MG tablet Commonly known as: NORVASC   levofloxacin 500 MG tablet Commonly known as: LEVAQUIN   predniSONE 20 MG tablet Commonly known as: DELTASONE       TAKE these medications    FLONASE ALLERGY RELIEF NA Place 2 sprays into the nose in the morning and at bedtime.   moxifloxacin 400 MG tablet Commonly known as: AVELOX Take 1 tablet (400 mg total) by mouth daily at 6 PM for 3 days.   multivitamin with minerals tablet Take 1 tablet by mouth daily.   OVER THE COUNTER MEDICATION Take 1 tablet by mouth daily. Adaptive OTC anxiety reducing supplement   psyllium 0.52 g capsule Commonly known as: REGULOID Take 0.52 g by mouth every other day.   saccharomyces boulardii 250 MG capsule Commonly known as: FLORASTOR Take 1 capsule (250 mg total) by mouth 2 (two) times daily for 14 days.   TYLENOL PO Take 1  tablet by mouth daily as needed (pain).        Follow-up Information     Stoneking, Hal, MD Follow up in 1 week(s).   Specialty: Internal Medicine Contact information: [address] [place] [address] [phone]                Allergies  Allergen Reactions   Biaxin [Clarithromycin] Other (See Comments)    Unknown per Daughter    Jaclyn Prime [Ibandronic Acid] Other (See Comments)    Upset stomach   Ceftin [Cefuroxime Axetil] Other (See Comments)    Unknown per Daughter    Codeine Other (See Comments)    Unknown per Daughter    Dilantin [Phenytoin Sodium Extended] Other (See Comments)    Unknown per Daughter    Fosamax [Alendronate Sodium] Other (See Comments)    Upset stomach   Guaifenesin & Derivatives Other (See Comments)    Unknown per Daughter    Imitrex [Sumatriptan] Other (See Comments)    Unknown per Daughter    Keflex [Cephalexin] Other (See Comments)    Unknown per Daughter    Macrobid [Nitrofurantoin Macrocrystal] Hives and Itching   Montelukast Other (See Comments)    Unknown per Daughter  Other Other (See Comments)    Flu shot made rash on arm and caused itching to arm   Penicillins Other (See Comments)    Unknown per Daughter    Premarin [Conjugated Estrogens] Itching   Sulfa Antibiotics Other (See Comments)    Unknown per Daughter    Hctz [Hydrochlorothiazide] Rash   Latex Rash    The results of significant diagnostics from this hospitalization (including imaging, microbiology, ancillary and laboratory) are listed below for reference.    Microbiology: Recent Results (from the past 240 hour(s))  Blood culture (routine x 2)     Status: None   Collection Time: 12/23/21 11:56 AM   Specimen: BLOOD  Result Value Ref Range Status   Specimen Description   Final    BLOOD RIGHT ANTECUBITAL Performed at [facility] [address], [place], [place] [address]    Special Requests   Final    BOTTLES DRAWN AEROBIC  AND ANAEROBIC Blood Culture results may not be optimal due to an inadequate volume of blood received in culture bottles Performed at [facility] [address] [place], [place] [address]    Culture   Final    NO GROWTH 5 DAYS Performed at [facility], [place] [address] [place], [place] [address]    Report Status 12/28/2021 FINAL  Final  Blood culture (routine x 2)     Status: None   Collection Time: 12/23/21  4:16 PM   Specimen: BLOOD  Result Value Ref Range Status   Specimen Description   Final    BLOOD BLOOD LEFT ARM Performed at [facility] [address], [place], [place] [address]    Special Requests   Final    BOTTLES DRAWN AEROBIC ONLY Blood Culture adequate volume Performed at [facility] [address] [place],  [address]    Culture   Final    NO GROWTH 5 DAYS Performed at [facility], [place] [address] [place],  [address]    Report Status 12/28/2021 FINAL  Final    Procedures/Studies: CT Chest W Contrast  Result Date: 12/23/2021 CLINICAL DATA:  Weakness. Shortness of breath. Right-sided lung cancer. Hypoxia. * Tracking Code: BO * EXAM: CT CHEST WITH CONTRAST TECHNIQUE: Multidetector CT imaging of the chest was performed during intravenous contrast administration. RADIATION DOSE REDUCTION: This exam was performed according to the departmental dose-optimization program which includes automated exposure control, adjustment of the mA and/or kV according to patient size and/or use of iterative reconstruction technique. CONTRAST:  160mL OMNIPAQUE IOHEXOL 300 MG/ML  SOLN COMPARISON:  Plain film of earlier today.  Chest CT 06/20/2020 FINDINGS: Moderately motion and arm position degraded exam. Cardiovascular: Aortic atherosclerosis. Normal heart size, with small volume anterior pericardial fluid. No central pulmonary embolism, on this non-dedicated study. Pulmonary artery enlargement, outflow tract  3.2 cm. Mediastinum/Nodes: No mediastinal or hilar adenopathy. Lungs/Pleura: Moderate right pleural effusion. Moderate centrilobular emphysema. Right lower lobe endobronchial compression or occlusion including on 74/7, new. Inferolateral right upper lobe 8 mm pulmonary nodule on 63/7, new since the prior CT. Right lower lobe lung mass measures 5.2 x 5.0 cm on 78/2 versus 3.3 x 2.5 cm on 06/20/2020. Left upper lobe pleural-based nodule measures 1.0 cm on 37/7. Right worse than left ground-glass opacity and interstitial thickening are relatively similar in severity to 06/20/2020. Upper Abdomen: Multiple small gallstones. Probable hepatic steatosis. Normal imaged portions of the spleen, stomach, pancreas, adrenal glands, kidneys. Musculoskeletal: Osteopenia. IMPRESSION:  1. Moderately degraded exam secondary to motion and patient arm position. 2. Enlargement of right lower lobe lung mass since 06/20/2020. Development of pleural-based pulmonary nodules, highly suspicious for metastatic disease. 3. Moderate right pleural effusion. 4. Bilateral interstitial opacities which are relatively similar to 06/20/2020. Considerations include postinfectious/inflammatory scarring, recurrent infection, including atypical etiologies, and less likely pulmonary edema. Lymphangitic tumor spread is felt less likely given relative stability over 18 months. 5. No thoracic adenopathy. 6. Aortic atherosclerosis (ICD10-I70.0) and emphysema (ICD10-J43.9). 7. Pulmonary artery enlargement suggests pulmonary arterial hypertension. 8. Cholelithiasis Electronically Signed   By: Abigail Miyamoto M.D.   On: 12/23/2021 16:04   DG Chest Port 1 View  Result Date: 12/23/2021 CLINICAL DATA:  Generalized weakness, worse for the last 2 days EXAM: PORTABLE CHEST 1 VIEW COMPARISON:  06/20/2020 FINDINGS: Chronic lung disease with coarse interstitial opacities. There is pulmonary fibrosis by prior CT. Moderate right pleural effusion which appears laterally  loculated. Normal heart size. Unremarkable aortic and hilar contours. IMPRESSION: 1. Moderate to large right pleural effusion which appears laterally loculated. Suggest chest CT. 2. Pulmonary fibrosis. Electronically Signed   By: Jorje Guild M.D.   On: 12/23/2021 10:32    Labs: BNP (last 3 results) Recent Labs    12/23/21 1050  BNP 70.2   Basic Metabolic Panel: Recent Labs  Lab 12/23/21 1010 12/24/21 0808 12/27/21 0556  NA 136 137  --   K 4.2 3.9  --   CL 103 108  --   CO2 26 25  --   GLUCOSE 89 89  --   BUN 14 13  --   CREATININE 0.41* 0.45 0.39*  CALCIUM 7.5* 7.5*  --    Liver Function Tests: Recent Labs  Lab 12/24/21 0808  AST 21  ALT 16  ALKPHOS 122  BILITOT 0.6  PROT 4.5*  ALBUMIN 2.0*   No results for input(s): "LIPASE", "AMYLASE" in the last 168 hours. No results for input(s): "AMMONIA" in the last 168 hours. CBC: Recent Labs  Lab 12/23/21 1010 12/24/21 0808  WBC 8.4 8.3  NEUTROABS 6.1  --   HGB 13.2 12.1  HCT 41.5 38.6  MCV 86.8 87.9  PLT 447* 431*   Cardiac Enzymes: No results for input(s): "CKTOTAL", "CKMB", "CKMBINDEX", "TROPONINI" in the last 168 hours. BNP: Invalid input(s): "POCBNP" CBG: No results for input(s): "GLUCAP" in the last 168 hours. D-Dimer No results for input(s): "DDIMER" in the last 72 hours. Hgb A1c No results for input(s): "HGBA1C" in the last 72 hours. Lipid Profile No results for input(s): "CHOL", "HDL", "LDLCALC", "TRIG", "CHOLHDL", "LDLDIRECT" in the last 72 hours. Thyroid function studies No results for input(s): "TSH", "T4TOTAL", "T3FREE", "THYROIDAB" in the last 72 hours.  Invalid input(s): "FREET3" Anemia work up No results for input(s): "VITAMINB12", "FOLATE", "FERRITIN", "TIBC", "IRON", "RETICCTPCT" in the last 72 hours. Urinalysis    Component Value Date/Time   COLORURINE YELLOW 06/21/2020 0200   APPEARANCEUR CLEAR 06/21/2020 0200   LABSPEC 1.009 06/21/2020 0200   PHURINE 6.0 06/21/2020 0200    GLUCOSEU NEGATIVE 06/21/2020 0200   HGBUR NEGATIVE 06/21/2020 0200   BILIRUBINUR NEGATIVE 06/21/2020 0200   KETONESUR 20 (A) 06/21/2020 0200   PROTEINUR NEGATIVE 06/21/2020 0200   NITRITE NEGATIVE 06/21/2020 0200   LEUKOCYTESUR NEGATIVE 06/21/2020 0200   Sepsis Labs Recent Labs  Lab 12/23/21 1010 12/24/21 0808  WBC 8.4 8.3   Microbiology Recent Results (from the past 240 hour(s))  Blood culture (routine x 2)     Status: None  Collection Time: 12/23/21 11:56 AM   Specimen: BLOOD  Result Value Ref Range Status   Specimen Description   Final    BLOOD RIGHT ANTECUBITAL Performed at [facility] [address], [place], [place] [address]    Special Requests   Final    BOTTLES DRAWN AEROBIC AND ANAEROBIC Blood Culture results may not be optimal due to an inadequate volume of blood received in culture bottles Performed at [facility] [address], [place], [place] [address]    Culture   Final    NO GROWTH 5 DAYS Performed at [facility], [place] [address], [place], [place] [address]    Report Status 12/28/2021 FINAL  Final  Blood culture (routine x 2)     Status: None   Collection Time: 12/23/21  4:16 PM   Specimen: BLOOD  Result Value Ref Range Status   Specimen Description   Final    BLOOD BLOOD LEFT ARM Performed at [facility] [address] [place], [place] [address]    Special Requests   Final    BOTTLES DRAWN AEROBIC ONLY Blood Culture adequate volume Performed at [facility] [address], [place], [place] [address]    Culture   Final    NO GROWTH 5 DAYS Performed at [facility], [place] [address], [place], [place] [address]    Report Status 12/28/2021 FINAL  Final  Time coordinating discharge: 25 minutes  SIGNED: Antonieta Pert, MD  [facility] 12/29/2021, 10:28 AM  If 7PM-7AM, please contact night-coverage [email or web address]

## 2021-12-29 NOTE — Care Management Important Message (Signed)
Important Message  Patient Details Hospice Name: Melanie Walker MRN: [id number] Date of Birth: Jun 19, 1930   Medicare Important Message Given:  No     Kerin Salen 12/29/2021, 10:51 AM

## 2021-12-29 NOTE — TOC Transition Note (Signed)
Transition of Care [facility]) - CM/SW Discharge Note   Patient Details  Name: MARKESHIA GIEBEL MRN: [id number] Date of Birth: [birth year]  Transition of Care [facility]) CM/SW Contact:  Vassie Moselle, LCSW Phone Number: 12/29/2021, 11:13 AM   Clinical Narrative:    Pt is to return home with hospice care through [facility]. DME has been delivered to pt's home. APP mattress topper is to be delivered to pt's home today around 12pm. Pt will be transported home via [facility]. [facility] has been arranged for 1pm pick up time.    Final next level of care: Home w Hospice Care Barriers to Discharge: No Barriers Identified   Patient Goals and CMS Choice Patient states their goals for this hospitalization and ongoing recovery are:: To return home with hospice   Choice offered to / list presented to : Adult Children  Discharge Placement                Patient to be transferred to facility by: [facility] Name of family member notified: Beautifull Cisar Patient and family notified of of transfer: 12/29/21  Discharge Plan and Services In-house Referral: Hospice / Palliative Care Discharge Planning Services: CM Consult Post Acute Care Choice: Durable Medical Equipment, Hospice          DME Arranged: Hospital bed, Oxygen, Overbed table, Wheelchair manual DME Agency: [facility]) Date DME Agency Contacted: 12/26/21 Time DME Agency Contacted: [phone] Representative spoke with at DME Agency: Nicholaus Corolla            Social Determinants of Health ([place]) Interventions     Readmission Risk Interventions    12/29/2021   11:12 AM 12/26/2021   11:38 AM  Readmission Risk Prevention Plan  Post Dischage Appt Complete Complete  Medication Screening Complete Complete  Transportation Screening Complete Complete

## 2021-12-30 NOTE — Progress Notes (Signed)
[facility] COMMUNITY PALLIATIVE CARE RN NOTE  PATIENT NAME: Melanie Walker DOB: [birth year] MRN: [id number]  PRIMARY CARE PROVIDER: Lajean Manes, MD  RESPONSIBLE PARTY: Marcial Pacas (daughter) Acct ID - Guarantor Home Phone Work Phone Relationship Acct Type  [id number] QUINETTE, [id number]  Self P/F     [address], [place],  [address]    RN telephonic encounter completed with patient's daughter [name]. She reports that she is noticing patient having some work of breathing, congestion and wheezing. Her oxygen levels have also been dropping into the mid 80s at times on room air. She used to have oxygen in the home in the past, but sent it back to the company since patient was not needing it at that time. She recently completed a 7 day course of antibiotics and 5 days of Prednisone. She says initially she seemed to improve, but then 2 days later started to show above symptoms. I advised that I spoke with Damaris Hippo NP and she recommends that patient be evaluated in the ED. Also educated regarding hospice services. Daughter requests order for a hospice evaluation. Advised that since it is after 5p that I will contact her PCP first thing in the morning. Reinforced that if patient's condition worsens that she needs to have her seen at the hospital. She verbalized understanding.   Daryl Eastern, RN BSN

## 2021-12-31 ENCOUNTER — Other Ambulatory Visit: Payer: Medicare HMO | Admitting: Family Medicine

## 2022-06-05 DEATH — deceased

## 2022-07-11 IMAGING — CT CT HEAD W/O CM
3 series · 15 of 47 positions shown, 18 images · non-contrast
Comparison: None.

CLINICAL DATA: Weakness.

EXAM:
CT HEAD WITHOUT CONTRAST
CT CERVICAL SPINE WITHOUT CONTRAST
TECHNIQUE: Multidetector CT imaging of the head and cervical spine was
performed following the standard protocol without intravenous
contrast. Multiplanar CT image reconstructions of the cervical spine
were also generated.

[Series 3: head wo · axial · 0.41mm/px · z∈[+1439,+1564]mm · 9 of 30 slices shown, 12 images]
[im 3/30  brain]
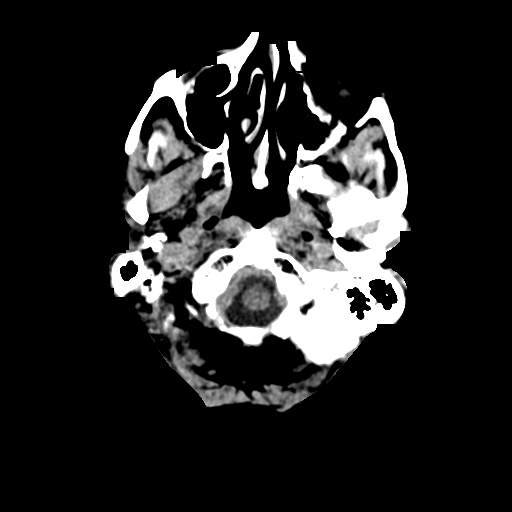
[im 3/30  bone]
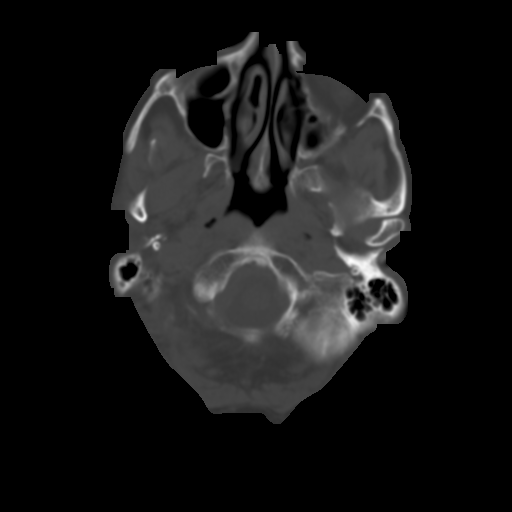
[im 6/30  brain]
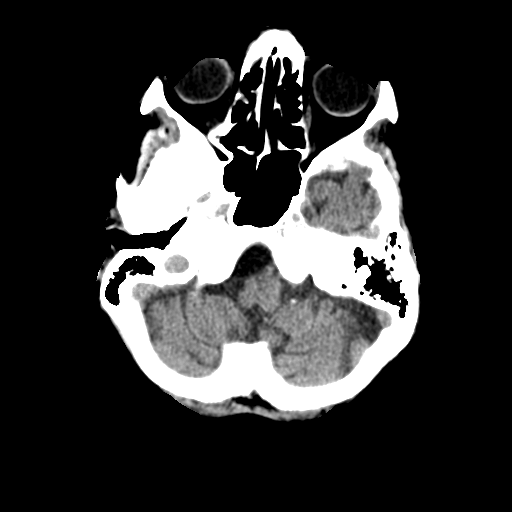
[im 9/30  brain]
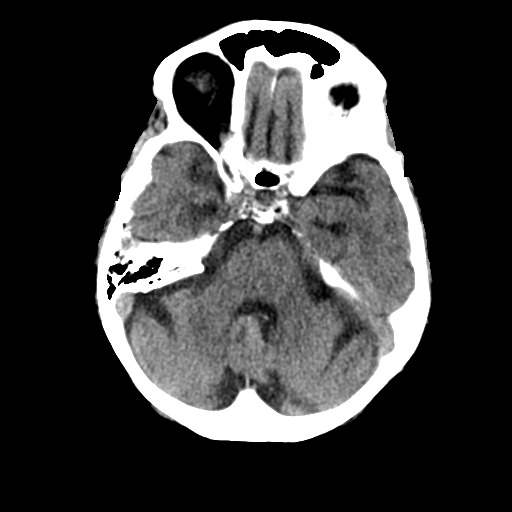
[im 12/30  brain]
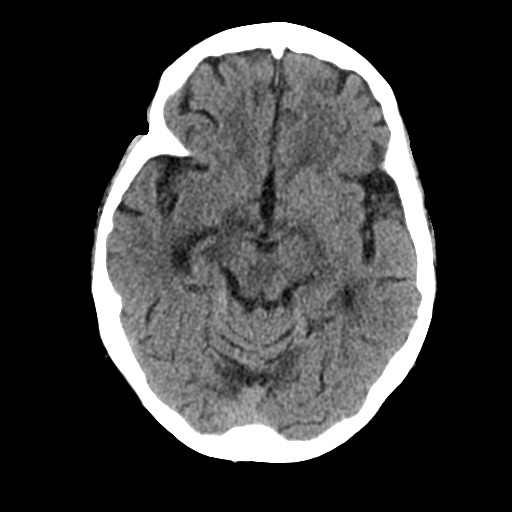
[im 16/30  brain]
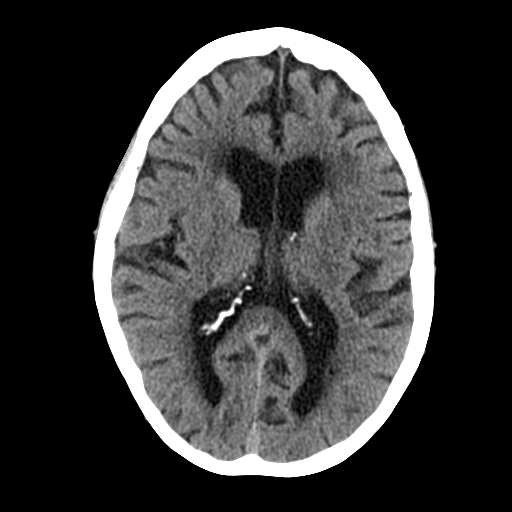
[im 16/30  bone]
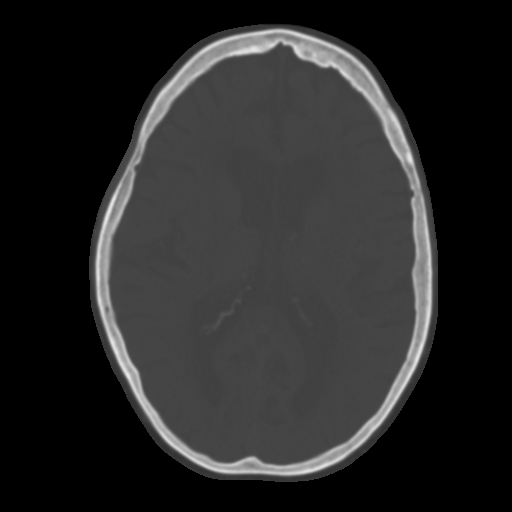
[im 19/30  brain]
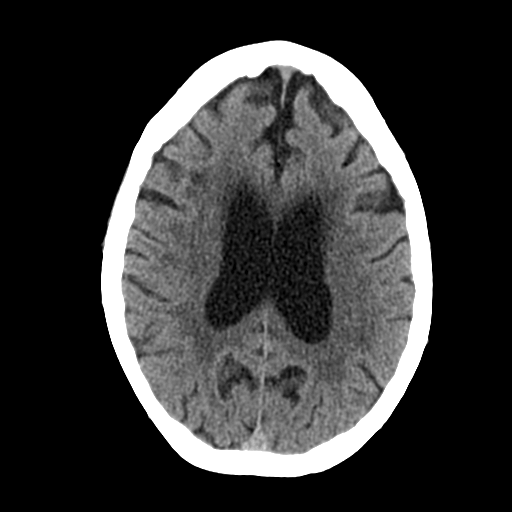
[im 22/30  brain]
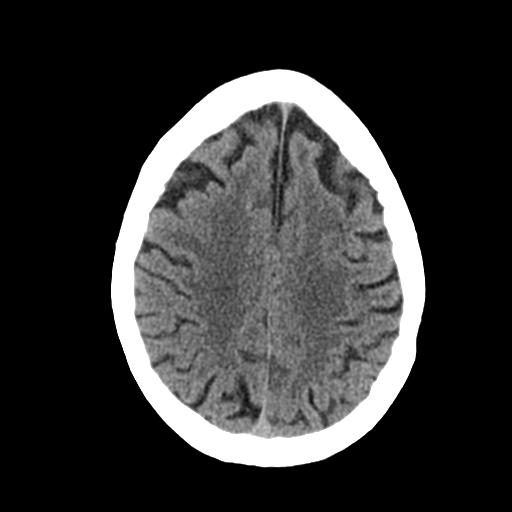
[im 25/30  brain]
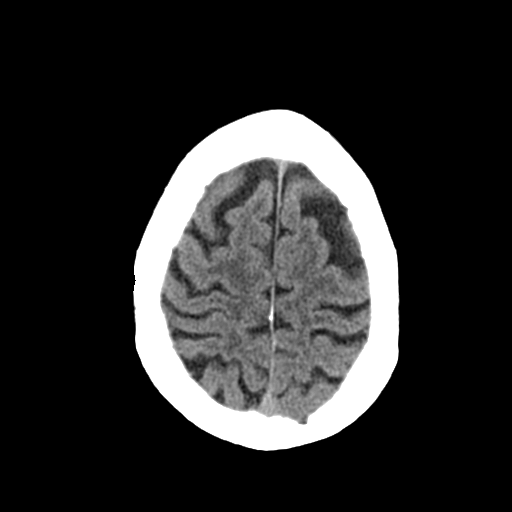
[im 28/30  brain]
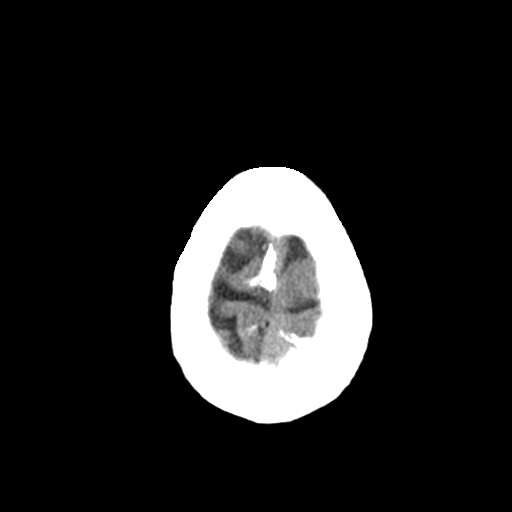
[im 28/30  bone]
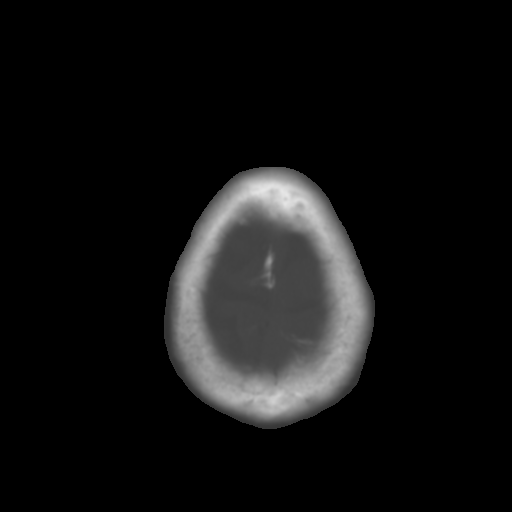

[Series 6: coronal soft tissue · coronal · 0.31mm/px · 3 of 66 slices shown]
[im 22/66  brain]
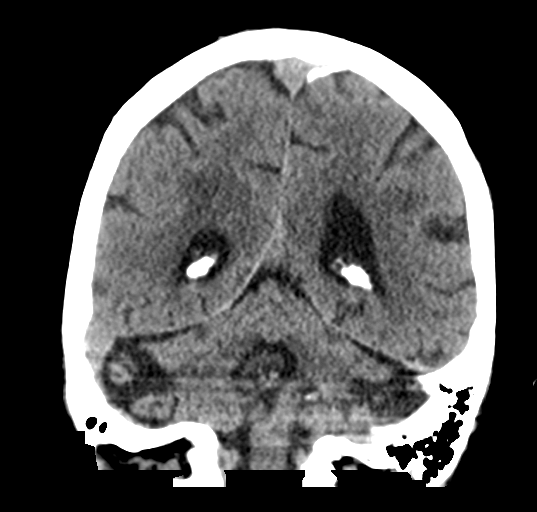
[im 29/66  brain]
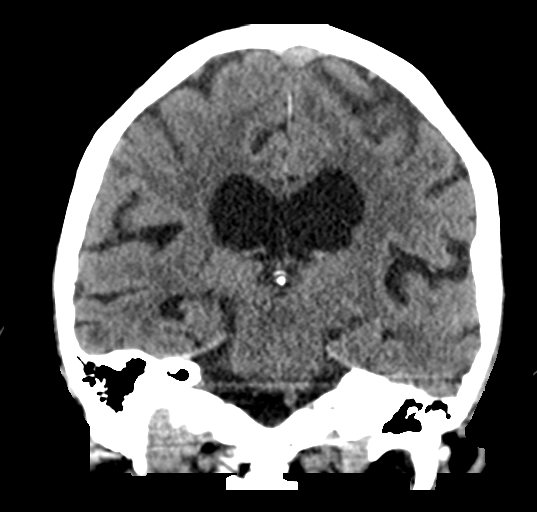
[im 37/66  brain]
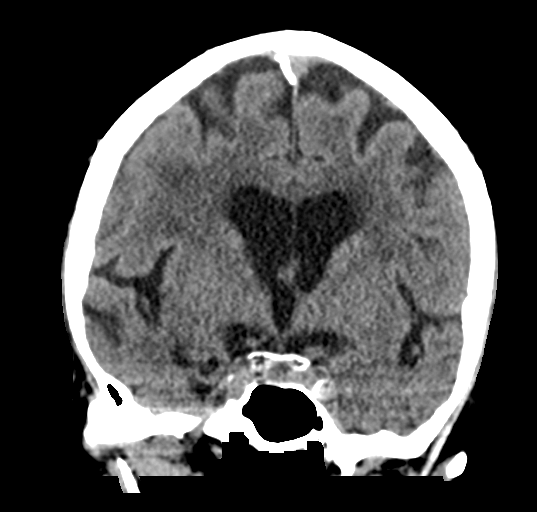

[Series 7: sagittal soft tissue · sagittal · 0.32mm/px · 3 of 83 slices shown]
[im 28/83  brain]
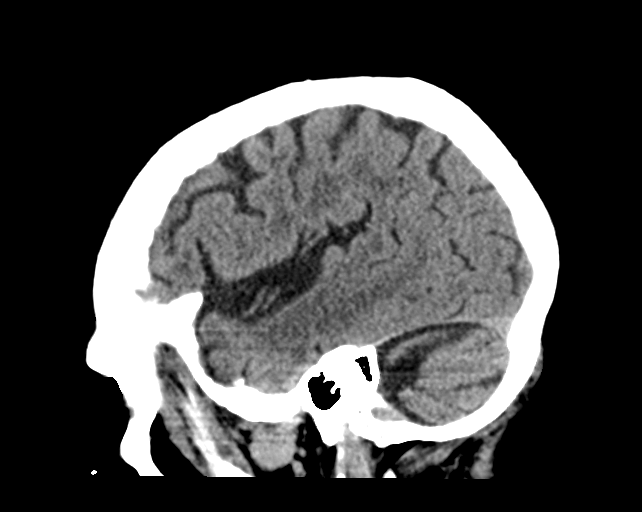
[im 42/83  brain]
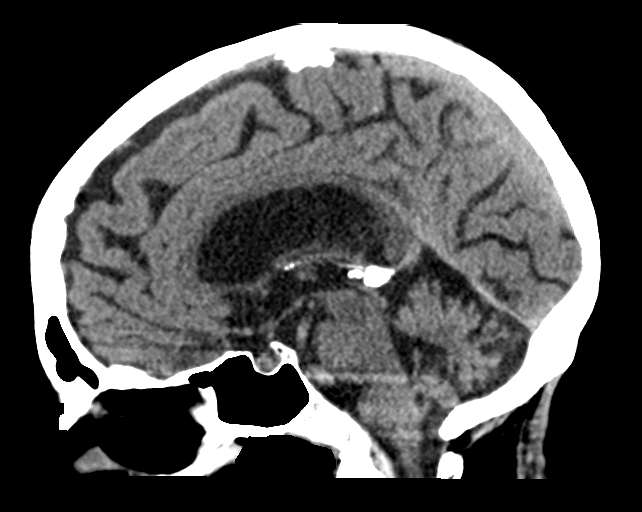
[im 55/83  brain]
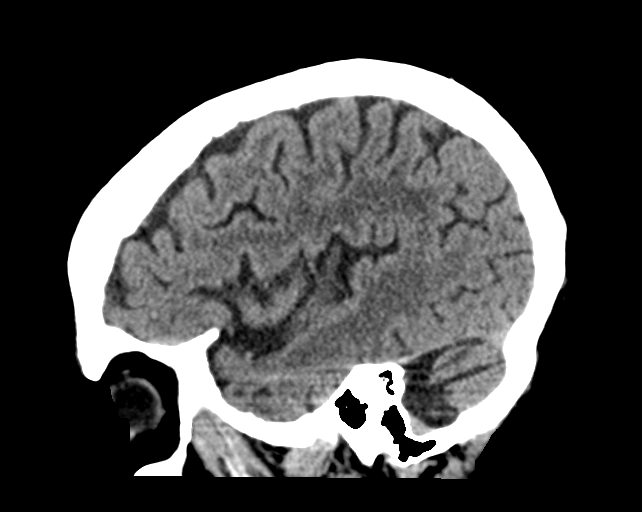

[15 of 47 positions shown; findings below may reference images not displayed]

FINDINGS: CT HEAD FINDINGS

Brain: Mild diffuse cortical atrophy is noted. Mild chronic ischemic
white matter disease is noted. No mass effect or midline shift is
noted. Ventricular size is within normal limits. There is no
evidence of mass lesion, hemorrhage or acute infarction.

Vascular: No hyperdense vessel or unexpected calcification.

Skull: Normal. Negative for fracture or focal lesion.

Sinuses/Orbits: No acute finding.

Other: None.

CT CERVICAL SPINE FINDINGS

Alignment: Minimal grade 1 anterolisthesis of C4-5 is noted
secondary to posterior facet joint hypertrophy.

Skull base and vertebrae: No acute fracture. No primary bone lesion
or focal pathologic process.

Soft tissues and spinal canal: No prevertebral fluid or swelling. No
visible canal hematoma.

Disc levels: Severe degenerative disc disease is noted at C5-6.
Moderate degenerative disc disease is noted at C6-7.

Upper chest: Negative.

Other: None.
IMPRESSION: 1. Mild diffuse cortical atrophy. Mild chronic ischemic white matter
disease. No acute intracranial abnormality seen.
2. Multilevel degenerative disc disease. No acute abnormality seen
in the cervical spine.
